# Patient Record
Sex: Female | Born: 1970 | Race: Black or African American | Hispanic: No | Smoking: Former smoker
Health system: Southern US, Community
[De-identification: ages and names within clinical notes are randomized; demographics above are authoritative.]

## PROBLEM LIST (undated history)

## (undated) ENCOUNTER — Emergency Department (HOSPITAL_COMMUNITY): Admission: EM | Discharge status: Absent without leave | Payer: Self-pay

## (undated) DIAGNOSIS — R519 Headache, unspecified: Secondary | ICD-10-CM

## (undated) DIAGNOSIS — F32A Depression, unspecified: Secondary | ICD-10-CM

## (undated) DIAGNOSIS — K219 Gastro-esophageal reflux disease without esophagitis: Secondary | ICD-10-CM

## (undated) DIAGNOSIS — D219 Benign neoplasm of connective and other soft tissue, unspecified: Secondary | ICD-10-CM

## (undated) DIAGNOSIS — C801 Malignant (primary) neoplasm, unspecified: Secondary | ICD-10-CM

## (undated) DIAGNOSIS — F329 Major depressive disorder, single episode, unspecified: Secondary | ICD-10-CM

## (undated) DIAGNOSIS — D649 Anemia, unspecified: Secondary | ICD-10-CM

## (undated) DIAGNOSIS — F419 Anxiety disorder, unspecified: Secondary | ICD-10-CM

## (undated) DIAGNOSIS — M1990 Unspecified osteoarthritis, unspecified site: Secondary | ICD-10-CM

## (undated) HISTORY — PX: IR EMBO TUMOR ORGAN ISCHEMIA INFARCT INC GUIDE ROADMAPPING: IMG5449

## (undated) HISTORY — PX: EYE SURGERY: SHX253

## (undated) HISTORY — DX: Headache, unspecified: R51.9

---

## 2011-08-09 ENCOUNTER — Other Ambulatory Visit: Payer: Self-pay | Admitting: Otolaryngology

## 2011-08-09 DIAGNOSIS — R1310 Dysphagia, unspecified: Secondary | ICD-10-CM

## 2011-08-15 ENCOUNTER — Ambulatory Visit
Admission: RE | Admit: 2011-08-15 | Discharge: 2011-08-15 | Disposition: A | Discharge status: Home / Self Care | Payer: Managed Care, Other (non HMO) | Source: Ambulatory Visit | Attending: Otolaryngology | Admitting: Otolaryngology

## 2011-08-15 DIAGNOSIS — R1310 Dysphagia, unspecified: Secondary | ICD-10-CM

## 2011-08-18 ENCOUNTER — Other Ambulatory Visit: Payer: Self-pay | Admitting: Otolaryngology

## 2011-08-19 ENCOUNTER — Ambulatory Visit
Admission: RE | Admit: 2011-08-19 | Discharge: 2011-08-19 | Disposition: A | Discharge status: Home / Self Care | Payer: Managed Care, Other (non HMO) | Source: Ambulatory Visit | Attending: Otolaryngology | Admitting: Otolaryngology

## 2011-08-23 ENCOUNTER — Other Ambulatory Visit: Payer: Managed Care, Other (non HMO)

## 2012-10-05 ENCOUNTER — Encounter: Payer: Managed Care, Other (non HMO) | Admitting: Obstetrics & Gynecology

## 2014-04-30 DIAGNOSIS — F32A Depression, unspecified: Secondary | ICD-10-CM | POA: Insufficient documentation

## 2014-09-09 ENCOUNTER — Emergency Department (HOSPITAL_BASED_OUTPATIENT_CLINIC_OR_DEPARTMENT_OTHER)
Admission: EM | Admit: 2014-09-09 | Discharge: 2014-09-09 | Disposition: A | Discharge status: Home / Self Care | Payer: Managed Care, Other (non HMO) | Attending: Emergency Medicine | Admitting: Emergency Medicine

## 2014-09-09 ENCOUNTER — Emergency Department (HOSPITAL_BASED_OUTPATIENT_CLINIC_OR_DEPARTMENT_OTHER): Admission: EM | Admit: 2014-09-09 | Discharge: 2014-09-09 | Discharge status: Absent without leave | Payer: Self-pay

## 2014-09-09 ENCOUNTER — Encounter (HOSPITAL_BASED_OUTPATIENT_CLINIC_OR_DEPARTMENT_OTHER): Payer: Self-pay

## 2014-09-09 DIAGNOSIS — R319 Hematuria, unspecified: Secondary | ICD-10-CM

## 2014-09-09 DIAGNOSIS — Z8719 Personal history of other diseases of the digestive system: Secondary | ICD-10-CM | POA: Insufficient documentation

## 2014-09-09 DIAGNOSIS — Z720 Tobacco use: Secondary | ICD-10-CM | POA: Insufficient documentation

## 2014-09-09 DIAGNOSIS — I10 Essential (primary) hypertension: Secondary | ICD-10-CM

## 2014-09-09 DIAGNOSIS — Z3202 Encounter for pregnancy test, result negative: Secondary | ICD-10-CM | POA: Insufficient documentation

## 2014-09-09 HISTORY — DX: Gastro-esophageal reflux disease without esophagitis: K21.9

## 2014-09-09 LAB — CBC WITH DIFFERENTIAL/PLATELET
BASOS ABS: 0 10*3/uL (ref 0.0–0.1)
BASOS PCT: 0 % (ref 0–1)
EOS ABS: 0.1 10*3/uL (ref 0.0–0.7)
Eosinophils Relative: 2 % (ref 0–5)
HEMATOCRIT: 33.1 % — AB (ref 36.0–46.0)
Hemoglobin: 10.2 g/dL — ABNORMAL LOW (ref 12.0–15.0)
Lymphocytes Relative: 32 % (ref 12–46)
Lymphs Abs: 1.7 10*3/uL (ref 0.7–4.0)
MCH: 26.9 pg (ref 26.0–34.0)
MCHC: 30.8 g/dL (ref 30.0–36.0)
MCV: 87.3 fL (ref 78.0–100.0)
Monocytes Absolute: 0.6 10*3/uL (ref 0.1–1.0)
Monocytes Relative: 11 % (ref 3–12)
Neutro Abs: 2.8 10*3/uL (ref 1.7–7.7)
Neutrophils Relative %: 55 % (ref 43–77)
Platelets: 279 10*3/uL (ref 150–400)
RBC: 3.79 MIL/uL — ABNORMAL LOW (ref 3.87–5.11)
RDW: 17.6 % — AB (ref 11.5–15.5)
WBC: 5.2 10*3/uL (ref 4.0–10.5)

## 2014-09-09 LAB — URINE MICROSCOPIC-ADD ON

## 2014-09-09 LAB — BASIC METABOLIC PANEL
Anion gap: 3 — ABNORMAL LOW (ref 5–15)
BUN: 13 mg/dL (ref 6–23)
CO2: 20 mmol/L (ref 19–32)
Calcium: 8.3 mg/dL — ABNORMAL LOW (ref 8.4–10.5)
Chloride: 111 mmol/L (ref 96–112)
Creatinine, Ser: 0.76 mg/dL (ref 0.50–1.10)
GFR calc Af Amer: >90 mL/min (ref >90)
GFR calc non Af Amer: >90 mL/min (ref >90)
Glucose, Bld: 101 mg/dL — ABNORMAL HIGH (ref 70–99)
Potassium: 4.6 mmol/L (ref 3.5–5.1)
Sodium: 134 mmol/L — ABNORMAL LOW (ref 135–145)

## 2014-09-09 LAB — PREGNANCY, URINE: PREG TEST UR: NEGATIVE

## 2014-09-09 LAB — URINALYSIS, ROUTINE W REFLEX MICROSCOPIC
BILIRUBIN URINE: NEGATIVE
Glucose, UA: NEGATIVE mg/dL
KETONES UR: 15 mg/dL — AB
LEUKOCYTES UA: NEGATIVE
NITRITE: NEGATIVE
PH: 6 (ref 5.0–8.0)
PROTEIN: NEGATIVE mg/dL
Specific Gravity, Urine: 1.024 (ref 1.005–1.030)
Urobilinogen, UA: 1 mg/dL (ref 0.0–1.0)

## 2014-09-09 MED ORDER — ACETAMINOPHEN 325 MG PO TABS
650.0000 mg | ORAL_TABLET | Freq: Once | ORAL | Status: AC
  Administered 2014-09-09: 650 mg via ORAL
  Filled 2014-09-09: qty 2

## 2014-09-09 MED ORDER — HYDROCHLOROTHIAZIDE 25 MG PO TABS: 25.0000 mg | ORAL_TABLET | Freq: Every day | ORAL | Status: DC

## 2014-09-09 NOTE — ED Provider Notes (Signed)
CSN: [id number]     Arrival date & time 09/09/14  1258 History   First MD Initiated Contact with Patient 09/09/14 1514     Chief Complaint  Patient presents with  . Flank Pain     (Consider location/radiation/quality/duration/timing/severity/associated sxs/prior Treatment) HPI  44 year old female presents with an intermittent headache since last week. Patient states it came on gradually and has come and gone. Nothing seems to make it better or worse. She was trying to enroll in a medical study yesterday and they took her blood pressures that was 170/100. She's never been told she has high blood pressure before. The headache is a throbbing headache on the left side of her head. There is no chest pain. No blurry vision or weakness. No shortness of breath. She also been having intermittent flank pain bilaterally for this past week. One time last week she has blood in her urine but ever since then after increasing her water intake she's not noticed any. Patient denies any dysuria. Has been taking Aleve with some relief of her headache. No primary care doctor.  Past Medical History  Diagnosis Date  . GERD (gastroesophageal reflux disease)    History reviewed. No pertinent past surgical history. No family history on file. History  Substance Use Topics  . Smoking status: Current Some Day Smoker  . Smokeless tobacco: Not on file  . Alcohol Use: No   OB History    No data available     Review of Systems  Constitutional: Negative for fever.  Eyes: Negative for visual disturbance.  Respiratory: Negative for shortness of breath.   Cardiovascular: Negative for chest pain.  Gastrointestinal: Negative for vomiting and abdominal pain.  Genitourinary: Positive for flank pain. Negative for dysuria.  Neurological: Positive for headaches. Negative for dizziness, weakness and numbness.  All other systems reviewed and are negative.     Allergies  Percocet and Sulfa antibiotics  Home Medications    Prior to Admission medications   Not on File   BP 168/98 mmHg  Pulse 74  Temp(Src) 98.5 F (36.9 C) (Oral)  Resp 16  Ht 5\' 6"  (1.676 m)  Wt 153 lb (69.4 kg)  BMI 24.71 kg/m2  SpO2 100%  LMP 09/09/2014 Physical Exam  Constitutional: She is oriented to person, place, and time. She appears well-developed and well-nourished.  HENT:  Head: Normocephalic and atraumatic.  Right Ear: External ear normal.  Left Ear: External ear normal.  Nose: Nose normal.  Eyes: EOM are normal. Pupils are equal, round, and reactive to light. Right eye exhibits no discharge. Left eye exhibits no discharge.  Cardiovascular: Normal rate, regular rhythm and normal heart sounds.   Pulmonary/Chest: Effort normal and breath sounds normal.  Abdominal: Soft. Normal appearance. There is no tenderness. There is CVA tenderness (mild, bilateral).  Neurological: She is alert and oriented to person, place, and time. GCS eye subscore is 4. GCS verbal subscore is 5. GCS motor subscore is 6.  Reflex Scores:      Bicep reflexes are 2+ on the right side and 2+ on the left side.      Patellar reflexes are 2+ on the right side and 2+ on the left side. CN 2-12 grossly intact. 5/5 strength in all 4 extremities. Normal gross sensation  Skin: Skin is warm and dry.  Nursing note and vitals reviewed.   ED Course  Procedures (including critical care time) Labs Review Labs Reviewed  URINALYSIS, ROUTINE W REFLEX MICROSCOPIC - Abnormal; Notable for the following:  Hgb urine dipstick LARGE (*)    Ketones, ur 15 (*)    All other components within normal limits  BASIC METABOLIC PANEL - Abnormal; Notable for the following:    Sodium 134 (*)    Glucose, Bld 101 (*)    Calcium 8.3 (*)    Anion gap 3 (*)    All other components within normal limits  CBC WITH DIFFERENTIAL/PLATELET - Abnormal; Notable for the following:    RBC 3.79 (*)    Hemoglobin 10.2 (*)    HCT 33.1 (*)    RDW 17.6 (*)    All other components within  normal limits  URINE MICROSCOPIC-ADD ON - Abnormal; Notable for the following:    Squamous Epithelial / LPF FEW (*)    Bacteria, UA FEW (*)    All other components within normal limits  PREGNANCY, URINE    Imaging Review No results found.   EKG Interpretation None      MDM   Final diagnoses:  Essential hypertension  Hematuria    Patient's headache is likely from her hypertension. No focal neurologic abnormalities and a normal exam. No sudden onset to suggest subarachnoid hemorrhage. I do not feel she needs acute imaging currently. We'll treat her hypertension and give her referral for a PCP. Patient advised return precautions. No chest symptoms. Uncertain the etiology of her bilateral flank pain. There is no UTI. She does have hematuria but states she is also on her menstrual cycle. She is not having significant pain and I highly doubt bilateral renal stones. At this point will not image and recommend the follow-up with the primary provider.    Sherwood Gambler, MD 09/09/14 [phone]

## 2014-09-09 NOTE — ED Notes (Signed)
Reports headache since last week. Also sts left flank pain and hematuria yesterday

## 2015-04-10 ENCOUNTER — Emergency Department (HOSPITAL_COMMUNITY)
Admission: EM | Admit: 2015-04-10 | Discharge: 2015-04-10 | Disposition: A | Discharge status: Home / Self Care | Payer: 59 | Attending: Emergency Medicine | Admitting: Emergency Medicine

## 2015-04-10 ENCOUNTER — Encounter (HOSPITAL_COMMUNITY): Payer: Self-pay

## 2015-04-10 ENCOUNTER — Emergency Department (HOSPITAL_COMMUNITY): Payer: 59

## 2015-04-10 DIAGNOSIS — IMO0001 Reserved for inherently not codable concepts without codable children: Secondary | ICD-10-CM

## 2015-04-10 DIAGNOSIS — Y9289 Other specified places as the place of occurrence of the external cause: Secondary | ICD-10-CM | POA: Diagnosis not present

## 2015-04-10 DIAGNOSIS — R030 Elevated blood-pressure reading, without diagnosis of hypertension: Secondary | ICD-10-CM | POA: Diagnosis not present

## 2015-04-10 DIAGNOSIS — S99922A Unspecified injury of left foot, initial encounter: Secondary | ICD-10-CM | POA: Diagnosis present

## 2015-04-10 DIAGNOSIS — Z79899 Other long term (current) drug therapy: Secondary | ICD-10-CM | POA: Insufficient documentation

## 2015-04-10 DIAGNOSIS — Z720 Tobacco use: Secondary | ICD-10-CM | POA: Diagnosis not present

## 2015-04-10 DIAGNOSIS — S92332A Displaced fracture of third metatarsal bone, left foot, initial encounter for closed fracture: Secondary | ICD-10-CM | POA: Diagnosis not present

## 2015-04-10 DIAGNOSIS — Y998 Other external cause status: Secondary | ICD-10-CM | POA: Insufficient documentation

## 2015-04-10 DIAGNOSIS — S92342A Displaced fracture of fourth metatarsal bone, left foot, initial encounter for closed fracture: Secondary | ICD-10-CM | POA: Insufficient documentation

## 2015-04-10 DIAGNOSIS — S92302A Fracture of unspecified metatarsal bone(s), left foot, initial encounter for closed fracture: Secondary | ICD-10-CM

## 2015-04-10 DIAGNOSIS — Y9389 Activity, other specified: Secondary | ICD-10-CM | POA: Insufficient documentation
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
## 2015-04-10 NOTE — Discharge Instructions (Signed)
Elevate the left foot above your heart, as much as possible. Use ibuprofen for pain. Follow-up with the primary care doctor for blood pressure check in one to 2 weeks    Cast or Splint Care Casts and splints support injured limbs and keep bones from moving while they heal. It is important to care for your cast or splint at home.  HOME CARE INSTRUCTIONS  Keep the cast or splint uncovered during the drying period. It can take 24 to 48 hours to dry if it is made of plaster. A fiberglass cast will dry in less than 1 hour.  Do not rest the cast on anything harder than a pillow for the first 24 hours.  Do not put weight on your injured limb or apply pressure to the cast until your health care provider gives you permission.  Keep the cast or splint dry. Wet casts or splints can lose their shape and may not support the limb as well. A wet cast that has lost its shape can also create harmful pressure on your skin when it dries. Also, wet skin can become infected.  Cover the cast or splint with a plastic bag when bathing or when out in the rain or snow. If the cast is on the trunk of the body, take sponge baths until the cast is removed.  If your cast does become wet, dry it with a towel or a blow dryer on the cool setting only.  Keep your cast or splint clean. Soiled casts may be wiped with a moistened cloth.  Do not place any hard or soft foreign objects under your cast or splint, such as cotton, toilet paper, lotion, or powder.  Do not try to scratch the skin under the cast with any object. The object could get stuck inside the cast. Also, scratching could lead to an infection. If itching is a problem, use a blow dryer on a cool setting to relieve discomfort.  Do not trim or cut your cast or remove padding from inside of it.  Exercise all joints next to the injury that are not immobilized by the cast or splint. For example, if you have a long leg cast, exercise the hip joint and toes. If you  have an arm cast or splint, exercise the shoulder, elbow, thumb, and fingers.  Elevate your injured arm or leg on 1 or 2 pillows for the first 1 to 3 days to decrease swelling and pain.It is best if you can comfortably elevate your cast so it is higher than your heart. SEEK MEDICAL CARE IF:   Your cast or splint cracks.  Your cast or splint is too tight or too loose.  You have unbearable itching inside the cast.  Your cast becomes wet or develops a soft spot or area.  You have a bad smell coming from inside your cast.  You get an object stuck under your cast.  Your skin around the cast becomes red or raw.  You have new pain or worsening pain after the cast has been applied. SEEK IMMEDIATE MEDICAL CARE IF:   You have fluid leaking through the cast.  You are unable to move your fingers or toes.  You have discolored (blue or white), cool, painful, or very swollen fingers or toes beyond the cast.  You have tingling or numbness around the injured area.  You have severe pain or pressure under the cast.  You have any difficulty with your breathing or have shortness of breath.  You  have chest pain.   This information is not intended to replace advice given to you by your health care provider. Make sure you discuss any questions you have with your health care provider.   Document Released: 06/17/2000 Document Revised: 04/10/2013 Document Reviewed: 12/27/2012 Elsevier Interactive Patient Education 2016 [employer].  Hypertension Hypertension is another name for high blood pressure. High blood pressure forces your heart to work harder to pump blood. A blood pressure reading has two numbers, which includes a higher number over a lower number (example: 110/72). HOME CARE   Have your blood pressure rechecked by your doctor.  Only take medicine as told by your doctor. Follow the directions carefully. The medicine does not work as well if you skip doses. Skipping doses also puts you  at risk for problems.  Do not smoke.  Monitor your blood pressure at home as told by your doctor. GET HELP IF:  You think you are having a reaction to the medicine you are taking.  You have repeat headaches or feel dizzy.  You have puffiness (swelling) in your ankles.  You have trouble with your vision. GET HELP RIGHT AWAY IF:   You get a very bad headache and are confused.  You feel weak, numb, or faint.  You get chest or belly (abdominal) pain.  You throw up (vomit).  You cannot breathe very well. MAKE SURE YOU:   Understand these instructions.  Will watch your condition.  Will get help right away if you are not doing well or get worse.   This information is not intended to replace advice given to you by your health care provider. Make sure you discuss any questions you have with your health care provider.   Document Released: 12/07/2007 Document Revised: 06/25/2013 Document Reviewed: 04/12/2013 Elsevier Interactive Patient Education 2016 [employer].   Emergency Department Resource Guide 1) Find a Doctor and Pay Out of Pocket Although you won't have to find out who is covered by your insurance plan, it is a good idea to ask around and get recommendations. You will then need to call the office and see if the doctor you have chosen will accept you as a new patient and what types of options they offer for patients who are self-pay. Some doctors offer discounts or will set up payment plans for their patients who do not have insurance, but you will need to ask so you aren't surprised when you get to your appointment.  2) Contact Your Local Health Department Not all health departments have doctors that can see patients for sick visits, but many do, so it is worth a call to see if yours does. If you don't know where your local health department is, you can check in your phone book. The CDC also has a tool to help you locate your state's health department, and many state  websites also have listings of all of their local health departments.  3) Find a [facility] If your illness is not likely to be very severe or complicated, you may want to try a walk in clinic. These are popping up all over the country in pharmacies, drugstores, and shopping centers. They're usually staffed by nurse practitioners or physician assistants that have been trained to treat common illnesses and complaints. They're usually fairly quick and inexpensive. However, if you have serious medical issues or chronic medical problems, these are probably not your best option.  No Primary Care Doctor: - Call Health Connect at  [phone] - they  can help you locate a primary care doctor that  accepts your insurance, provides certain services, etc. - Physician Referral Service- [phone]  Chronic Pain Problems: Organization         Address  Phone   Notes  [facility]  [phone] Patients need to be referred by their primary care doctor.   Medication Assistance: Organization         Address  Phone   Notes  [facility] Medication [facility] [address] [place], [place] [address] [phone] --Must be a resident of [facility] -- Must have NO insurance coverage whatsoever (no Medicaid/ Medicare, etc.) -- The pt. MUST have a primary care doctor that directs their care regularly and follows them in the community   MedAssist  [phone]   [employer]  [phone]    Agencies that provide inexpensive medical care: Organization         Address  Phone   Notes  [place]  [phone]   [facility]    [phone]   [facility] [place], [place] [address] [phone]   [place] [address], [place] [phone]   Planned Parenthood    [phone]   [facility]    [phone]   [place] and [address], [place] Phone:  [phone], Fax:  [phone] Hours of Operation:  9 am - 6 pm, M-F.  Also accepts Medicaid/Medicare and self-pay.  [facility] for [place] [address] [place], [address], [place] Phone: [phone], Fax: [phone]. Hours of Operation:  8:30 am - 5:30 pm, M-F.  Also accepts Medicaid and self-pay.  [facility] [address], [place] Phone: [phone]   [place], [place], [place] [phone], Ext. [phone] Mondays & Thursdays: 7-9 AM.  First 15 patients are seen on a first come, first serve basis.    [place] Providers:  Organization         Address  Phone   Notes  [facility] [address], [place] [phone] Also accepts self-pay patients.  [facility] [address], [place]  [phone]   [place], [address], [place] [phone]   [facility] [address], [place] [phone]   Lucianne Lei [address], [place]   [phone] Only accepts [place] Access [place] patients after they have their name applied to their card.   Self-Pay (no insurance) in [facility] [place]:  Organization         Address  Phone   Notes  Sickle Cell Patients, [facility] Internal [facility] [place] [phone]   [facility] [place] [phone]   [facility] [place]  [place], [place], [place] [phone]   [facility]/Dr. Osei-Bonsu  [address], [place] or [address], [place] [phone] Phone number for both [place] and [place] locations is the same.  [facility] [address], [place] [phone]   [facility] [place] or [place]  [address] [phone]   [facility] [place] [phone], phone; [phone], fax Sees patients 1st and 3rd Saturday of every month.  Must not qualify for public or private insurance (i.e. Medicaid, Medicare, [place] Health Choice, Veterans' Benefits)  Household income should be no more than 200% of the poverty level The clinic cannot treat you if you are pregnant or think you are pregnant  Sexually transmitted diseases are not treated at the clinic.    Dental Care: Organization         Address  Phone  Notes  [facility] Department of [facility] [place] [phone] Accepts children up to age 22 who are enrolled in [place] or [place]; pregnant women with a Medicaid card; and children who have applied for Medicaid or [place] Health Choice, but were declined, whose parents can pay a reduced fee at time of service.  [facility] Department of [facility]  [address], [place] [phone] Accepts children up to age 31 who are enrolled in [place] or [place]; pregnant women with a Medicaid card; and children who have applied for Medicaid or [place] Health Choice, but were declined, whose parents can pay a reduced fee at time of service.  [place] Adult Dental Access PROGRAM  [place] [phone] Patients are seen by appointment only. Walk-ins are not accepted. [place] will see patients 30 years of age and older. Monday - Tuesday (8am-5pm) Most Wednesdays (8:30-5pm) $30 per visit, cash only  [facility] Adult Dental Access PROGRAM  [address], [facility] [phone] Patients are seen by appointment only. Walk-ins are not accepted. [place] will see patients 29 years of age and older. One Wednesday Evening (Monthly: Volunteer Based).  $30 per visit, cash only  [place]  [phone] for adults; Children under age 9, call Graduate Pediatric Dentistry at [phone]. Children aged 61-14, please call [phone] to request a pediatric application.  Dental services are provided in all areas of dental care including fillings, crowns and bridges, complete and partial dentures, implants, gum treatment, root canals, and extractions. Preventive care is also provided. Treatment is provided to both adults and children. Patients are selected via a lottery and there is often a waiting list.   [facility] [address] [place]  [phone] [email or web address]   [facility] [address] [place], [place] [phone], Ext. [phone] Second and Fourth Thursday of each month, opens at 6:30 AM; Clinic ends at 9 AM.  Patients are seen on a first-come first-served basis, and a limited number are seen during each clinic.   [facility]  [address] [place] Danker [place], [place] [phone]   Eligibility Requirements You must have lived in [place], [place], or [place] counties for at least the last three months.   You cannot be eligible for state or federal sponsored [employer], including [employer], [place], or [employer].   You generally cannot be eligible for healthcare insurance through your employer.    How to apply: Eligibility screenings are held every Tuesday and Wednesday afternoon from 1:00 pm until 4:00 pm. You do not need an appointment for the interview!  [facility] [address] [place], [place]   [place]  [place] Department  [place] in the Community: Intensive Outpatient Programs Organization         Address  Phone  Notes  [place]. [address] [place], [place]  [phone]   [facility] Outpatient [address] [place], [place]   ADS: Alcohol [facility] [address] [place], [place]   [place] [address],  [place], [place] or [phone]   Substance Abuse Resources Organization         Address  Phone  Notes  Al[facility] Drug Services  [phone]   [place]  [phone]   [facility] [place]   Chinita Pester  [phone]   Residential & Outpatient Substance Abuse Program  [phone]   Psychological Services Organization         Address  Phone  Notes  [facility] [place]  [place]  [phone]   [place] [address], [place] or [phone]    Mobile Crisis Teams Organization         Address  Phone  Notes  [facility], Mobile Crisis Care Unit  [phone]   [facility]  [address] [place], [place]   [place] [address] [place] [phone]    Self-Help/Support Groups Organization         Address  Phone             Notes  [place]. of [place] - variety of support groups  [place] Call for more information  Narcotics Anonymous (NA), [facility] [address], [employer] [place]  2 meetings at this location   Special educational needs teacher         Address  Phone  Notes  [facility] Residential Treatment [place],    [place]  [phone]   [facility]  [address] [place] [address]93, [place], [place]   [place], [place] [phone] Admissions: 8am-3pm M-F  Incentives Substance [place] [address]    [place], [place] [phone]   [facility] [address] [place], [place], [place]   [facility] [address],    [place], [place]   [facility] Programs - Intensive Outpatient [address] Kristeen [address], [place], [place]   [facility] ([place]  [place], [place] [phone] or [phone]   [facility] (RTS) [address], [place], [place] Accepts Medicaid  [facility] [place] [address],  [place] [phone] Substance Abuse/Addiction Treatment   [facility] Organization         Address  Phone  Notes  [facility]  [phone]   Domenic Schwab, PhD [address] [place]lis [place], [place]   [phone] or [phone]   [facility], [place] [phone]   [place] [address] [place], [place] [phone] Insurance/Medicaid/sponsorship through [employer] and Families [address], [address]  [place], [place] [phone] [place], [place] [phone]    Dr. Adele Schilder  [phone]   [facility] Dept. 1) [address] [place] 2) [place] 3)  [address], [place] [phone]  [phone]   [place] [phone] or [phone] (After Hours)

## 2016-01-08 ENCOUNTER — Encounter (HOSPITAL_BASED_OUTPATIENT_CLINIC_OR_DEPARTMENT_OTHER): Payer: Self-pay

## 2017-03-31 ENCOUNTER — Emergency Department
Admission: EM | Admit: 2017-03-31 | Discharge: 2017-03-31 | Disposition: A | Discharge status: Home / Self Care | Payer: Self-pay | Attending: Emergency Medicine | Admitting: Emergency Medicine

## 2017-03-31 ENCOUNTER — Encounter: Payer: Self-pay | Admitting: Emergency Medicine

## 2017-03-31 ENCOUNTER — Emergency Department: Payer: Self-pay

## 2017-03-31 DIAGNOSIS — R300 Dysuria: Secondary | ICD-10-CM | POA: Insufficient documentation

## 2017-03-31 DIAGNOSIS — F17200 Nicotine dependence, unspecified, uncomplicated: Secondary | ICD-10-CM | POA: Insufficient documentation

## 2017-03-31 DIAGNOSIS — Z79899 Other long term (current) drug therapy: Secondary | ICD-10-CM | POA: Insufficient documentation

## 2017-03-31 DIAGNOSIS — R1031 Right lower quadrant pain: Secondary | ICD-10-CM | POA: Insufficient documentation

## 2017-03-31 DIAGNOSIS — R350 Frequency of micturition: Secondary | ICD-10-CM | POA: Insufficient documentation

## 2017-03-31 DIAGNOSIS — N3090 Cystitis, unspecified without hematuria: Secondary | ICD-10-CM

## 2017-03-31 LAB — URINALYSIS, COMPLETE (UACMP) WITH MICROSCOPIC
Bilirubin Urine: NEGATIVE
Glucose, UA: NEGATIVE mg/dL
Ketones, ur: NEGATIVE mg/dL
NITRITE: NEGATIVE
PH: 6 (ref 5.0–8.0)
PROTEIN: 30 mg/dL — AB
Specific Gravity, Urine: 1.021 (ref 1.005–1.030)

## 2017-03-31 LAB — BASIC METABOLIC PANEL
ANION GAP: 9 (ref 5–15)
BUN: 9 mg/dL (ref 6–20)
CO2: 25 mmol/L (ref 22–32)
Calcium: 9.1 mg/dL (ref 8.9–10.3)
Chloride: 108 mmol/L (ref 101–111)
Creatinine, Ser: 0.76 mg/dL (ref 0.44–1.00)
GFR calc Af Amer: >60 mL/min (ref >60)
Glucose, Bld: 99 mg/dL (ref 65–99)
Potassium: 4.3 mmol/L (ref 3.5–5.1)
Sodium: 142 mmol/L (ref 135–145)

## 2017-03-31 LAB — CBC
HCT: 29.4 % — ABNORMAL LOW (ref 35.0–47.0)
HEMOGLOBIN: 9.3 g/dL — AB (ref 12.0–16.0)
MCH: 25.9 pg — AB (ref 26.0–34.0)
MCHC: 31.8 g/dL — ABNORMAL LOW (ref 32.0–36.0)
MCV: 81.4 fL (ref 80.0–100.0)
Platelets: 337 10*3/uL (ref 150–440)
RBC: 3.61 MIL/uL — ABNORMAL LOW (ref 3.80–5.20)
RDW: 22.1 % — AB (ref 11.5–14.5)
WBC: 4.4 10*3/uL (ref 3.6–11.0)

## 2017-03-31 LAB — POCT PREGNANCY, URINE: Preg Test, Ur: NEGATIVE

## 2017-03-31 MED ORDER — CEPHALEXIN 500 MG PO CAPS: 500.0000 mg | ORAL_CAPSULE | Freq: Two times a day (BID) | ORAL | 0 refills | Status: DC

## 2017-03-31 NOTE — ED Provider Notes (Signed)
[facility] Emergency Department Provider Note  ____________________________________________  Time seen: Approximately 6:44 PM  I have reviewed the triage vital signs and the nursing notes.   HISTORY  Chief Complaint Flank Pain    HPI Cindy Brewer is a 46 y.o. female who complains of right flank and right lower quadrant abdominal pain for the past 3-4 weeks, much worse in the last 2 days. Also reports urinary frequency and dysuria. Has a feeling of inability to empty her bladder. No fever or chills or sweats. Does have a history of UTIs. Pain is intermittent and waxing and waning. No aggravating or alleviating factors.     Past Medical History:  Diagnosis Date  . GERD (gastroesophageal reflux disease)      There are no active problems to display for this patient.    Past Surgical History:  Procedure Laterality Date  . CESAREAN SECTION       Prior to Admission medications   Medication Sig Start Date End Date Taking? Authorizing Provider  cephALEXin (KEFLEX) 500 MG capsule Take 1 capsule (500 mg total) by mouth 2 (two) times daily. 03/31/17   Carrie Mew, MD  hydrochlorothiazide (HYDRODIURIL) 25 MG tablet Take 1 tablet (25 mg total) by mouth daily. 09/09/14   Sherwood Gambler, MD  Multiple Vitamin (MULTIVITAMIN WITH MINERALS) TABS tablet Take 1 tablet by mouth daily.    [provider]  Thiamine HCl (VITAMIN B-1) 250 MG tablet Take 250 mg by mouth daily.    [provider]  vitamin A 10000 UNIT capsule Take 10,000 Units by mouth daily.    [provider]  vitamin C (ASCORBIC ACID) 500 MG tablet Take 500 mg by mouth daily.    [provider]     Allergies Demerol [meperidine]; Percocet [oxycodone-acetaminophen]; Sulfa antibiotics; Percocet [oxycodone-acetaminophen]; and Sulfa antibiotics   No family history on file.  Social History Social History  Substance Use Topics  . Smoking status:  Current Every Day Smoker  . Smokeless tobacco: Never Used  . Alcohol use Yes    Review of Systems  Constitutional:   No fever or chills.  ENT:   No sore throat. No rhinorrhea. Cardiovascular:   No chest pain or syncope. Respiratory:   No dyspnea or cough. Gastrointestinal:   positive abdominal pain as above without vomiting and diarrhea.  Musculoskeletal:   Negative for focal pain or swelling All other systems reviewed and are negative except as documented above in ROS and HPI.  ____________________________________________   PHYSICAL EXAM:  VITAL SIGNS: ED Triage Vitals  Enc Vitals Group     BP 03/31/17 1418 (!) 156/97     Pulse Rate 03/31/17 1418 66     Resp 03/31/17 1418 18     Temp 03/31/17 1418 98.7 F (37.1 C)     Temp Source 03/31/17 1418 Oral     SpO2 03/31/17 1418 99 %     Weight 03/31/17 1418 143 lb (64.9 kg)     Height 03/31/17 1418 5\' 6"  (1.676 m)     Head Circumference --      Peak Flow --      Pain Score 03/31/17 1417 5     Pain Loc --      Pain Edu? --      Excl. in [place]? --     Vital signs reviewed, nursing assessments reviewed.   Constitutional:   Alert and oriented. Well appearing and in no distress. Eyes:   No scleral icterus.  EOMI.  No nystagmus. No conjunctival pallor. PERRL. ENT   Head:   Normocephalic and atraumatic.   Nose:   No congestion/rhinnorhea.    Mouth/Throat:   MMM, no pharyngeal erythema. No peritonsillar mass.    Neck:   No meningismus. Full ROM Hematological/Lymphatic/Immunilogical:   No cervical lymphadenopathy. Cardiovascular:   RRR. Symmetric bilateral radial and DP pulses.  No murmurs.  Respiratory:   Normal respiratory effort without tachypnea/retractions. Breath sounds are clear and equal bilaterally. No wheezes/rales/rhonchi. Gastrointestinal:   Soft with suprapubic tenderness. Non distended. There is no CVA tenderness.  No rebound, rigidity, or guarding. Genitourinary:   deferred Musculoskeletal:   Normal  range of motion in all extremities. No joint effusions.  No lower extremity tenderness.  No edema. Neurologic:   Normal speech and language.  Motor grossly intact. No gross focal neurologic deficits are appreciated.  Skin:    Skin is warm, dry and intact. No rash noted.  No petechiae, purpura, or bullae.  ____________________________________________    LABS (pertinent positives/negatives) (all labs ordered are listed, but only abnormal results are displayed) Labs Reviewed  URINALYSIS, COMPLETE (UACMP) WITH MICROSCOPIC - Abnormal; Notable for the following:       Result Value   Color, Urine YELLOW (*)    APPearance HAZY (*)    Hgb urine dipstick SMALL (*)    Protein, ur 30 (*)    Leukocytes, UA MODERATE (*)    Bacteria, UA RARE (*)    Squamous Epithelial / LPF 0-5 (*)    All other components within normal limits  CBC - Abnormal; Notable for the following:    RBC 3.61 (*)    Hemoglobin 9.3 (*)    HCT 29.4 (*)    MCH 25.9 (*)    MCHC 31.8 (*)    RDW 22.1 (*)    All other components within normal limits  BASIC METABOLIC PANEL  POC URINE PREG, ED  POCT PREGNANCY, URINE   ____________________________________________   EKG    ____________________________________________    RADIOLOGY  Ct Renal Stone Study  Result Date: 03/31/2017 CLINICAL DATA:  Flank pain, subjective urinary retention for 2 weeks. EXAM: CT ABDOMEN AND PELVIS WITHOUT CONTRAST TECHNIQUE: Multidetector CT imaging of the abdomen and pelvis was performed following the standard protocol without IV contrast. COMPARISON:  None. FINDINGS: LOWER CHEST: Lung bases are clear. The visualized heart size is normal. No pericardial effusion. HEPATOBILIARY: Normal. PANCREAS: Normal. SPLEEN: Normal. ADRENALS/URINARY TRACT: Kidneys are orthotopic, demonstrating normal size and morphology. No nephrolithiasis, hydronephrosis; limited assessment for renal masses on this nonenhanced examination. The unopacified ureters are normal  in course and caliber. Urinary bladder is partially distended and unremarkable. Normal adrenal glands. STOMACH/BOWEL: The stomach, small and large bowel are normal in course and caliber without inflammatory changes, sensitivity decreased by lack of enteric contrast. Normal appendix. VASCULAR/LYMPHATIC: Aortoiliac vessels are normal in course and caliber. No lymphadenopathy by CT size criteria. REPRODUCTIVE: Enlarged lobulated uterus with suspected 5.8 cm LEFT intramural leiomyoma. OTHER: Small amount of free fluid in the pelvis is likely physiologic. No intraperitoneal free air or focal fluid collection. MUSCULOSKELETAL: Non-acute.  Anterior pelvic wall scarring. IMPRESSION: 1. No urolithiasis, obstructive uropathy nor acute intra-abdominal/pelvic process. 2. Enlarged, leiomyomatosis uterus. Electronically Signed   By: Elon Alas M.D.   On: 03/31/2017 17:45    ____________________________________________   PROCEDURES Procedures  ____________________________________________   INITIAL IMPRESSION / ASSESSMENT AND PLAN / ED COURSE  Pertinent labs & imaging results that were available during my care of the patient  were reviewed by me and considered in my medical decision making (see chart for details).  patient well appearing no acute distress, presents with abdominal pain that fits the pattern possibly for renal colic. She has no history of kidney stones, but with urinalysis showing a clear urinary tract infection, a CT scan was performed to be sure that she does not have an obstructing stone. This was negative. She is well-appearing with unremarkable vitals and unremarkable labs, and suitable for outpatient follow-up. Started on Keflex, urine culture. Considering the patient's symptoms, medical history, and physical examination today, I have low suspicion for cholecystitis or biliary pathology, pancreatitis, perforation or bowel obstruction, hernia, intra-abdominal abscess, AAA or dissection,  volvulus or intussusception, mesenteric ischemia, or appendicitis.        ____________________________________________   FINAL CLINICAL IMPRESSION(S) / ED DIAGNOSES  Final diagnoses:  Cystitis      New Prescriptions   CEPHALEXIN (KEFLEX) 500 MG CAPSULE    Take 1 capsule (500 mg total) by mouth 2 (two) times daily.     Portions of this note were generated with dragon dictation software. Dictation errors may occur despite best attempts at proofreading.    Carrie Mew, MD 03/31/17 [phone]

## 2017-03-31 NOTE — ED Notes (Signed)
Pt verbalizes understanding of discharge instructions.

## 2017-03-31 NOTE — ED Triage Notes (Signed)
Pt states she feels she is unable to empty out her bladder. Pain in flank area bilaterally and radiates to lower abd. Denies burning with urination, states this has been going on for 2 weeks now.

## 2017-03-31 NOTE — ED Notes (Signed)
Pt presents with back/lower abdominal/flank pain x 3-4 weeks, with symptoms worsening in last 2 days. States she has had urinary retention as well. Nausea affirmed; denies vomiting, fever, chills. Pt alert & oriented with NAD.

## 2017-04-02 LAB — URINE CULTURE

## 2017-06-28 ENCOUNTER — Encounter: Payer: Self-pay | Admitting: Physician Assistant

## 2017-06-28 ENCOUNTER — Emergency Department
Admission: EM | Admit: 2017-06-28 | Discharge: 2017-06-28 | Disposition: A | Discharge status: Home / Self Care | Payer: No Typology Code available for payment source | Attending: Emergency Medicine | Admitting: Emergency Medicine

## 2017-06-28 ENCOUNTER — Other Ambulatory Visit: Payer: Self-pay

## 2017-06-28 DIAGNOSIS — Y92410 Unspecified street and highway as the place of occurrence of the external cause: Secondary | ICD-10-CM | POA: Insufficient documentation

## 2017-06-28 DIAGNOSIS — Y9389 Activity, other specified: Secondary | ICD-10-CM | POA: Insufficient documentation

## 2017-06-28 DIAGNOSIS — F17200 Nicotine dependence, unspecified, uncomplicated: Secondary | ICD-10-CM | POA: Diagnosis not present

## 2017-06-28 DIAGNOSIS — Y999 Unspecified external cause status: Secondary | ICD-10-CM | POA: Insufficient documentation

## 2017-06-28 DIAGNOSIS — Z79899 Other long term (current) drug therapy: Secondary | ICD-10-CM | POA: Insufficient documentation

## 2017-06-28 DIAGNOSIS — S161XXA Strain of muscle, fascia and tendon at neck level, initial encounter: Secondary | ICD-10-CM | POA: Insufficient documentation

## 2017-06-28 DIAGNOSIS — S199XXA Unspecified injury of neck, initial encounter: Secondary | ICD-10-CM | POA: Diagnosis present

## 2017-06-28 MED ORDER — CYCLOBENZAPRINE HCL 10 MG PO TABS
10.0000 mg | ORAL_TABLET | Freq: Once | ORAL | Status: AC
  Administered 2017-06-28: 10 mg via ORAL
  Filled 2017-06-28: qty 1

## 2017-06-28 MED ORDER — CYCLOBENZAPRINE HCL 5 MG PO TABS: 5.0000 mg | ORAL_TABLET | Freq: Three times a day (TID) | ORAL | 0 refills | Status: DC | PRN
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
## 2017-06-28 NOTE — Discharge Instructions (Signed)
Your exam is essentially normal following your exam. Take the muscle relaxant as needed. Apply ice or moist heat as needed.
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
## 2017-07-04 DIAGNOSIS — C801 Malignant (primary) neoplasm, unspecified: Secondary | ICD-10-CM

## 2017-07-04 HISTORY — PX: BREAST LUMPECTOMY: SHX2

## 2017-07-04 HISTORY — DX: Malignant (primary) neoplasm, unspecified: C80.1

## 2017-07-04 HISTORY — PX: BREAST BIOPSY: SHX20

## 2017-07-25 ENCOUNTER — Other Ambulatory Visit (HOSPITAL_COMMUNITY): Payer: Self-pay | Admitting: *Deleted

## 2017-07-25 DIAGNOSIS — N6320 Unspecified lump in the left breast, unspecified quadrant: Secondary | ICD-10-CM

## 2017-08-03 ENCOUNTER — Other Ambulatory Visit (HOSPITAL_COMMUNITY): Payer: Self-pay | Admitting: Obstetrics and Gynecology

## 2017-08-03 ENCOUNTER — Ambulatory Visit
Admission: RE | Admit: 2017-08-03 | Discharge: 2017-08-03 | Disposition: A | Discharge status: Home / Self Care | Payer: No Typology Code available for payment source | Source: Ambulatory Visit | Attending: Obstetrics and Gynecology | Admitting: Obstetrics and Gynecology

## 2017-08-03 ENCOUNTER — Encounter (HOSPITAL_COMMUNITY): Payer: Self-pay

## 2017-08-03 ENCOUNTER — Ambulatory Visit (HOSPITAL_COMMUNITY)
Admission: RE | Admit: 2017-08-03 | Discharge: 2017-08-03 | Disposition: A | Discharge status: Home / Self Care | Payer: No Typology Code available for payment source | Source: Ambulatory Visit | Attending: Obstetrics and Gynecology | Admitting: Obstetrics and Gynecology

## 2017-08-03 ENCOUNTER — Ambulatory Visit
Admission: RE | Admit: 2017-08-03 | Discharge: 2017-08-03 | Disposition: A | Discharge status: Home / Self Care | Payer: Self-pay | Source: Ambulatory Visit | Attending: Obstetrics and Gynecology | Admitting: Obstetrics and Gynecology

## 2017-08-03 VITALS — BP 140/92 | Ht 66.0 in | Wt 154.6 lb

## 2017-08-03 DIAGNOSIS — N6320 Unspecified lump in the left breast, unspecified quadrant: Secondary | ICD-10-CM

## 2017-08-03 DIAGNOSIS — N6321 Unspecified lump in the left breast, upper outer quadrant: Secondary | ICD-10-CM

## 2017-08-03 DIAGNOSIS — Z1239 Encounter for other screening for malignant neoplasm of breast: Secondary | ICD-10-CM

## 2017-08-03 DIAGNOSIS — N6322 Unspecified lump in the left breast, upper inner quadrant: Secondary | ICD-10-CM

## 2017-08-03 DIAGNOSIS — N6325 Unspecified lump in the left breast, overlapping quadrants: Secondary | ICD-10-CM

## 2017-08-03 HISTORY — DX: Benign neoplasm of connective and other soft tissue, unspecified: D21.9

## 2017-08-03 NOTE — Patient Instructions (Addendum)
Explained breast self awareness with Cindy Brewer. Patient did not need a Pap smear today due to last Pap smear was in May 2018 per patient. Let her know [employer] will cover Pap smears every 3 years unless has a history of abnormal Pap smears. Referred patient to the [facility] for diagnostic mammogram and left breast ultrasound. Appointment scheduled for Thursday, August 03, 2017 at 1510.Discussed smoking cessation with patient. Referred patient to the [facility] Quitline and gave resources to free smoking cessation classes at [facility]. [name] verbalized understanding.  Breydan Shillingburg, [name], RN 4:36 PM

## 2017-08-03 NOTE — Progress Notes (Signed)
Complaints of three left breast lumps. Patient stated she noticed the first one in July and the other two in October.  Pap Smear: Pap smear not completed today. Last Pap smear was in May 2018 in [facility] and normal per patient. Per patient has a history of one abnormal Pap smear 20 years ago that a colposcopy and LEEP was completed for follow-up. No Pap smear results are in Epic.  Physical exam: Breasts Breasts symmetrical. No skin abnormalities bilateral breasts. No nipple retraction bilateral breasts. No nipple discharge bilateral breasts. No lymphadenopathy. No lumps palpated right breast. Palpated three lumps within the left breast. Palpated a lump within the left breast at 1 o'clock 3 cm from the nipple, 6 o'clock 4 cm from the nipple, and 11 o'clock 3 cm from the nipple. No complaints of pain or tenderness on exam. Referred patient to the [facility] for diagnostic mammogram and left breast ultrasound. Appointment scheduled for Thursday, August 03, 2017 at 1510.        Pelvic/Bimanual No Pap smear completed today since last Pap smear was in May 2018 per patient. Pap smear not indicated per [employer]CCCP guidelines.   Smoking History: Patient is a current smoker. Discussed smoking cessation with patient. Referred patient to the [facility] Quitline and gave resources to free smoking cessation classes at [facility].  Patient Navigation: Patient education provided. Access to services provided for patient through [facility] program.

## 2017-08-09 ENCOUNTER — Other Ambulatory Visit: Payer: Self-pay

## 2017-08-09 ENCOUNTER — Inpatient Hospital Stay: Admission: RE | Admit: 2017-08-09 | Payer: Self-pay | Source: Ambulatory Visit

## 2017-08-10 ENCOUNTER — Other Ambulatory Visit (HOSPITAL_COMMUNITY): Payer: Self-pay | Admitting: Obstetrics and Gynecology

## 2017-08-10 DIAGNOSIS — N6320 Unspecified lump in the left breast, unspecified quadrant: Secondary | ICD-10-CM

## 2017-08-14 ENCOUNTER — Ambulatory Visit
Admission: RE | Admit: 2017-08-14 | Discharge: 2017-08-14 | Disposition: A | Discharge status: Home / Self Care | Payer: No Typology Code available for payment source | Source: Ambulatory Visit | Attending: Obstetrics and Gynecology | Admitting: Obstetrics and Gynecology

## 2017-08-14 DIAGNOSIS — N6320 Unspecified lump in the left breast, unspecified quadrant: Secondary | ICD-10-CM

## 2017-08-15 ENCOUNTER — Other Ambulatory Visit: Payer: Self-pay | Admitting: Obstetrics and Gynecology

## 2017-08-15 DIAGNOSIS — Z853 Personal history of malignant neoplasm of breast: Secondary | ICD-10-CM

## 2017-08-15 DIAGNOSIS — N6320 Unspecified lump in the left breast, unspecified quadrant: Secondary | ICD-10-CM

## 2017-08-16 ENCOUNTER — Telehealth: Payer: Self-pay | Admitting: Oncology

## 2017-08-16 NOTE — Telephone Encounter (Signed)
Spoke with patient to confirm morning [facility] appointment for 2/20, packet e-mail to patient

## 2017-08-17 ENCOUNTER — Telehealth: Payer: Self-pay | Admitting: *Deleted

## 2017-08-17 NOTE — Telephone Encounter (Signed)
Received notification that pt will not come to [facility] at [facility]. She will go to [facility]

## 2017-08-21 ENCOUNTER — Other Ambulatory Visit (HOSPITAL_COMMUNITY): Payer: Self-pay | Admitting: General Surgery

## 2017-08-21 ENCOUNTER — Inpatient Hospital Stay
Admission: RE | Admit: 2017-08-21 | Discharge: 2017-08-21 | Disposition: A | Discharge status: Home / Self Care | Payer: Self-pay | Source: Ambulatory Visit | Attending: Obstetrics and Gynecology | Admitting: Obstetrics and Gynecology

## 2017-08-21 ENCOUNTER — Encounter: Payer: Self-pay | Admitting: *Deleted

## 2017-08-21 ENCOUNTER — Other Ambulatory Visit: Payer: Self-pay | Admitting: Obstetrics and Gynecology

## 2017-08-21 DIAGNOSIS — C50212 Malignant neoplasm of upper-inner quadrant of left female breast: Secondary | ICD-10-CM

## 2017-08-21 DIAGNOSIS — N6320 Unspecified lump in the left breast, unspecified quadrant: Secondary | ICD-10-CM

## 2017-08-21 DIAGNOSIS — Z171 Estrogen receptor negative status [ER-]: Principal | ICD-10-CM

## 2017-08-21 NOTE — Progress Notes (Signed)
  Oncology Nurse Navigator Documentation  Navigator Location: [facility] (08/21/17 1600) Referral date to RadOnc/MedOnc: 08/23/17 (08/21/17 1600) )Navigator Encounter Type: Introductory phone call (08/21/17 1600)   Abnormal Finding Date: 08/03/17 (08/21/17 1600) Confirmed Diagnosis Date: 08/14/17 (08/21/17 1600)                   Barriers/Navigation Needs: Coordination of Care (08/21/17 1600)                          Time Spent with Patient: 45 (08/21/17 1600)   Received referral from [facility], the scheduler to review referral.  Patient newly diagnosed with left breast cancer.  She was seen in the [facility] program and referred to Dr. Marlou Starks at [facility].  Dr. Marlou Starks has recommended neoadjuvant chemotherapy.  Talked to patient today.  She lives in [place] and it would be closer for her to get treatment here.  I have scheduled her to see Dr. Tasia Catchings on Wednesday at 11:00.  She is also coming by tomorrow at 11:00 to complete [employer] Medicaid application and educational literature.  She is to call if she has any questions.

## 2017-08-22 ENCOUNTER — Encounter: Payer: Self-pay | Admitting: *Deleted

## 2017-08-22 NOTE — Progress Notes (Signed)
  Oncology Nurse Navigator Documentation  Navigator Location: [facility] (08/22/17 1300)   )Navigator Encounter Type: Other (08/22/17 1300)                         Barriers/Navigation Needs: Financial (08/22/17 1300)   Interventions: Education (08/22/17 1300)     Education Method: Verbal;Written (08/22/17 1300)  Support Groups/Services: [facility];Breast Support Group (08/22/17 1300)             Time Spent with Patient: 45 (08/22/17 1300)   Met with patient today to complete [employer] Medicaid application.  Faxed to [place].  Gave patient breast cancer educational literature, "My Breast Cancer Treatment Handbook" by Josephine Igo, RN.  Reviewed diagnosis again with patient and need for genetic testing.  She is agreeable and states "I have 4 daughters, I need to know for them".  Will meet her tomorrow at her medical oncology consult.  She is to call if she has any questions or needs.

## 2017-08-23 ENCOUNTER — Inpatient Hospital Stay: Payer: Self-pay | Admitting: Oncology

## 2017-08-23 ENCOUNTER — Encounter: Payer: Self-pay | Admitting: *Deleted

## 2017-08-23 NOTE — Progress Notes (Signed)
  Oncology Nurse Navigator Documentation  Navigator Location: [facility] (08/23/17 1100)   )Navigator Encounter Type: Telephone (08/23/17 1100) Telephone: Outgoing Call (08/23/17 1100)                       Barriers/Navigation Needs: Coordination of Care (08/23/17 1100)                          Time Spent with Patient: 15 (08/23/17 1100)   Patient has not showed up for her medical oncology consult this morning.  Left her a message to return my call.

## 2017-08-24 ENCOUNTER — Encounter: Payer: Self-pay | Admitting: *Deleted

## 2017-08-25 ENCOUNTER — Inpatient Hospital Stay: Payer: Medicaid Other | Attending: Oncology | Admitting: Oncology

## 2017-08-25 ENCOUNTER — Ambulatory Visit: Payer: Self-pay | Admitting: General Surgery

## 2017-08-25 ENCOUNTER — Encounter: Payer: Self-pay | Admitting: *Deleted

## 2017-08-25 ENCOUNTER — Encounter: Payer: Self-pay | Admitting: Oncology

## 2017-08-25 ENCOUNTER — Inpatient Hospital Stay: Payer: Medicaid Other

## 2017-08-25 VITALS — BP 155/82 | HR 96 | Temp 98.3°F | Resp 18 | Ht 66.0 in | Wt 154.2 lb

## 2017-08-25 DIAGNOSIS — C50812 Malignant neoplasm of overlapping sites of left female breast: Secondary | ICD-10-CM | POA: Diagnosis present

## 2017-08-25 DIAGNOSIS — Z79899 Other long term (current) drug therapy: Secondary | ICD-10-CM | POA: Insufficient documentation

## 2017-08-25 DIAGNOSIS — Z171 Estrogen receptor negative status [ER-]: Secondary | ICD-10-CM | POA: Insufficient documentation

## 2017-08-25 DIAGNOSIS — K219 Gastro-esophageal reflux disease without esophagitis: Secondary | ICD-10-CM | POA: Diagnosis not present

## 2017-08-25 DIAGNOSIS — D509 Iron deficiency anemia, unspecified: Secondary | ICD-10-CM | POA: Diagnosis not present

## 2017-08-25 DIAGNOSIS — F17210 Nicotine dependence, cigarettes, uncomplicated: Secondary | ICD-10-CM | POA: Diagnosis not present

## 2017-08-25 DIAGNOSIS — C50912 Malignant neoplasm of unspecified site of left female breast: Secondary | ICD-10-CM

## 2017-08-25 LAB — COMPREHENSIVE METABOLIC PANEL
ALT: 9 U/L — ABNORMAL LOW (ref 14–54)
ANION GAP: 7 (ref 5–15)
AST: 19 U/L (ref 15–41)
Albumin: 3.8 g/dL (ref 3.5–5.0)
Alkaline Phosphatase: 26 U/L — ABNORMAL LOW (ref 38–126)
BILIRUBIN TOTAL: 0.3 mg/dL (ref 0.3–1.2)
BUN: 8 mg/dL (ref 6–20)
CALCIUM: 8.9 mg/dL (ref 8.9–10.3)
CO2: 24 mmol/L (ref 22–32)
Chloride: 109 mmol/L (ref 101–111)
Creatinine, Ser: 0.62 mg/dL (ref 0.44–1.00)
GFR calc non Af Amer: >60 mL/min (ref >60)
Glucose, Bld: 86 mg/dL (ref 65–99)
Potassium: 3.9 mmol/L (ref 3.5–5.1)
Sodium: 140 mmol/L (ref 135–145)
TOTAL PROTEIN: 7.1 g/dL (ref 6.5–8.1)

## 2017-08-25 LAB — CBC WITH DIFFERENTIAL/PLATELET
BASOS ABS: 0 10*3/uL (ref 0–0.1)
BASOS PCT: 1 %
Eosinophils Absolute: 0.1 10*3/uL (ref 0–0.7)
Eosinophils Relative: 1 %
HEMATOCRIT: 24.5 % — AB (ref 35.0–47.0)
HEMOGLOBIN: 7.4 g/dL — AB (ref 12.0–16.0)
Lymphocytes Relative: 24 %
Lymphs Abs: 1.1 10*3/uL (ref 1.0–3.6)
MCH: 22.5 pg — ABNORMAL LOW (ref 26.0–34.0)
MCHC: 30.2 g/dL — ABNORMAL LOW (ref 32.0–36.0)
MCV: 74.4 fL — ABNORMAL LOW (ref 80.0–100.0)
Monocytes Absolute: 0.6 10*3/uL (ref 0.2–0.9)
Monocytes Relative: 13 %
NEUTROS ABS: 2.9 10*3/uL (ref 1.4–6.5)
NEUTROS PCT: 61 %
Platelets: 310 10*3/uL (ref 150–440)
RBC: 3.29 MIL/uL — AB (ref 3.80–5.20)
RDW: 21.5 % — AB (ref 11.5–14.5)
WBC: 4.8 10*3/uL (ref 3.6–11.0)

## 2017-08-25 LAB — IRON AND TIBC
IRON: 8 ug/dL — AB (ref 28–170)
Saturation Ratios: 2 % — ABNORMAL LOW (ref 10.4–31.8)
TIBC: 409 ug/dL (ref 250–450)
UIBC: 401 ug/dL

## 2017-08-25 LAB — FERRITIN: FERRITIN: 5 ng/mL — AB (ref 11–307)

## 2017-08-25 NOTE — Progress Notes (Signed)
Hematology/Oncology Consult note [facility] [phone] [phone]   Patient Care Team: Patient, No Pcp Per as PCP - General (General Practice) Patient, No Pcp Per (General Practice)  REFERRING PROVIDER: Dr.Toth,Paul CHIEF COMPLAINTS/PURPOSE OF CONSULTATION:  Evaluation of breast cancer  HISTORY OF PRESENTING ILLNESS:  Cindy Brewer is a  47 y.o.  female with PMH listed below who was referred to me for evaluation of newly diagnosed breast cancer. Patient is a 47 year old female who felt a lump in the upper portion of the left breast about 3-4 months ago. Mammogram on August 03, 2017 showed 2 indeterminate hypoechoic masses at the 12:00 and 11:00.  These are the 2 masses corresponding to patient's palpable abnormality.There is also stable probably benign mass at the 2 o'clock position, possibly related to a complicated cyst or fibroadenoma.  #Patient underwent biopsy of the 12:00 and 11:00 mass and up pathology revealed both mass showed invasive ductal carcinoma, pathology commented that the carcinoma in the 2 specimen is somewhat similar and is grade 3.  The breast prognostic profile is performed on part 1, which showed ER PR negative and HER-2/neu negative, ki67 80%  #Patient supposed to have biopsy of the 2:00 mass which she missed the appointment.  Patient was seen and evaluated by surgeon Dr. Autumn Messing.  Given the size of the tumor and the fact that it is triple negative Dr. Marlou Starks referred the patient to see oncology to discuss about neoadjuvant chemotherapy.  Dr. Marlou Starks also ordered MRI of the breast and the patient has not have it done yet. Patient was a no-show on her original appointment with me.  Patient present to her appointment today.  She reports feeling well.  Denies any nipple discharge or breast pain.  She is a active daily smoker and she plans to quit.  She drinks alcohol occasionally on weekends.     Review of Systems    Constitutional: Negative for chills, fever, malaise/fatigue and weight loss.  HENT: Negative for hearing loss, nosebleeds and tinnitus.   Eyes: Negative for double vision and photophobia.  Respiratory: Negative for cough.   Cardiovascular: Negative for chest pain and palpitations.  Skin: Negative for rash.    MEDICAL HISTORY:  Past Medical History:  Diagnosis Date  . Fibroid   . GERD (gastroesophageal reflux disease)     SURGICAL HISTORY: Past Surgical History:  Procedure Laterality Date  . CESAREAN SECTION      SOCIAL HISTORY: Social History   Socioeconomic History  . Marital status: Divorced    Spouse name: Not on file  . Number of children: Not on file  . Years of education: Not on file  . Highest education level: Not on file  Social Needs  . Financial resource strain: Not on file  . Food insecurity - worry: Not on file  . Food insecurity - inability: Not on file  . Transportation needs - medical: Not on file  . Transportation needs - non-medical: Not on file  Occupational History  . Not on file  Tobacco Use  . Smoking status: Current Every Day Smoker  . Smokeless tobacco: Never Used  Substance and Sexual Activity  . Alcohol use: Yes    Comment: weekends  . Drug use: No  . Sexual activity: Not Currently  Other Topics Concern  . Not on file  Social History Narrative   ** Merged History Encounter **        FAMILY HISTORY: Family History  Problem Relation Age of  Onset  . Stroke Maternal Grandmother     ALLERGIES:  is allergic to demerol [meperidine]; percocet [oxycodone-acetaminophen]; sulfa antibiotics; percocet [oxycodone-acetaminophen]; and sulfa antibiotics.  MEDICATIONS:  Current Outpatient Medications  Medication Sig Dispense Refill  . cyclobenzaprine (FLEXERIL) 5 MG tablet Take 1 tablet (5 mg total) by mouth 3 (three) times daily as needed for muscle spasms. 15 tablet 0  . Multiple Vitamin (MULTIVITAMIN WITH MINERALS) TABS tablet Take 1 tablet  by mouth daily.    . cephALEXin (KEFLEX) 500 MG capsule Take 1 capsule (500 mg total) by mouth 2 (two) times daily. (Patient not taking: Reported on 08/03/2017) 14 capsule 0  . hydrochlorothiazide (HYDRODIURIL) 25 MG tablet Take 1 tablet (25 mg total) by mouth daily. (Patient not taking: Reported on 08/03/2017) 30 tablet 0  . Thiamine HCl (VITAMIN B-1) 250 MG tablet Take 250 mg by mouth daily.    . vitamin A 10000 UNIT capsule Take 10,000 Units by mouth daily.    . vitamin C (ASCORBIC ACID) 500 MG tablet Take 500 mg by mouth daily.     No current facility-administered medications for this visit.      PHYSICAL EXAMINATION: ECOG PERFORMANCE STATUS: 0 - Asymptomatic Vitals:   08/25/17 1357  BP: (!) 155/82  Pulse: 96  Resp: 18  Temp: 98.3 F (36.8 C)  SpO2: 99%   Filed Weights   08/25/17 1357  Weight: 154 lb 3.2 oz (69.9 kg)    Physical Exam  Constitutional: She is oriented to person, place, and time and well-developed, well-nourished, and in no distress. No distress.  HENT:  Head: Normocephalic and atraumatic.  Mouth/Throat: No oropharyngeal exudate.  Eyes: EOM are normal. Pupils are equal, round, and reactive to light. No scleral icterus.  Neck: Normal range of motion. Neck supple.  Cardiovascular: Normal rate, regular rhythm and normal heart sounds.  No murmur heard. Pulmonary/Chest: Effort normal and breath sounds normal. No respiratory distress. She has no wheezes.  Abdominal: Soft. Bowel sounds are normal. She exhibits no distension and no mass. There is no tenderness.  Musculoskeletal: Normal range of motion. She exhibits no edema.  Lymphadenopathy:    She has no cervical adenopathy.  Neurological: She is alert and oriented to person, place, and time.  Skin: Skin is warm and dry.  Psychiatric: Affect normal.  Breast exam was performed in seated and lying down position. Two Upper Left breast palpable masses. No palpable mass right breast. No palpable axillary lymph nodes      LABORATORY DATA:  I have reviewed the data as listed Lab Results  Component Value Date   WBC 4.8 08/25/2017   HGB 7.4 (L) 08/25/2017   HCT 24.5 (L) 08/25/2017   MCV 74.4 (L) 08/25/2017   PLT 310 08/25/2017   Recent Labs    03/31/17 1420  NA 142  K 4.3  CL 108  CO2 25  GLUCOSE 99  BUN 9  CREATININE 0.76  CALCIUM 9.1  GFRNONAA >60  GFRAA >60       ASSESSMENT & PLAN:  Cancer Staging Malignant neoplasm of overlapping sites of left breast in female, estrogen receptor negative ([place]) Staging form: Breast, AJCC 8th Edition - Clinical stage from 08/25/2017: Stage IIB (cT2(2), cN0, cM0, G3, ER: Negative, PR: Negative, HER2: Negative) - Signed by Earlie Server, MD on 08/25/2017  1. Malignant neoplasm of left breast in female, estrogen receptor negative, unspecified site of breast ([place])   2. 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  Dr.Toth has ordered MRI breast for further evaluation. Patient also missed her appointment for a third biopsy of 2' clock mass.  I agree with obtaining MRI breast and encourage patient to obtain test ASAP.  # refer to genetics for genetic testing.  # Given the size of tumor and triple negative breast cancer, I recommend neoadjuvant chemotherapy with ddAC-T.   I explained to the patient the risks and benefits of chemotherapy including all but not limited to hair loss, mouth sore, nausea, vomiting, low blood counts, bleeding, and risk of life threatening infection and even death, secondary malignancy etc.  # Chemotherapy education; port placement.   # check CBC and CMP today.  Labs obtained today revealed microcytic anemia consistent with severe iron deficiency.  Plan Venofer weekly x 4.    All questions were answered. The patient knows to call the clinic with any problems  questions or concerns.  Return of visit: 1 week for IV iron.  Thank you for this kind referral and the opportunity to participate in the care of this patient. A copy of today's note is routed to referring provider    Earlie Server, MD, PhD Hematology Oncology [facility] Pager- [phone] 08/25/2017

## 2017-08-25 NOTE — Progress Notes (Signed)
  Oncology Nurse Navigator Documentation  Navigator Location: [facility] (08/25/17 1500)   )Navigator Encounter Type: Initial MedOnc (08/25/17 1500)                     Patient Visit Type: Initial (08/25/17 1500) Treatment Phase: Pre-Tx/Tx Discussion (08/25/17 1500) Barriers/Navigation Needs: Coordination of Care (08/25/17 1500)   Interventions: Coordination of Care (08/25/17 1500)                      Time Spent with Patient: 60 (08/25/17 1500)   Met with patient today during her initial medical oncology visit with Dr. Tasia Catchings.  Offered support.  Patient did not go for her breast MRI or 3rd biopsy.  Discussed with Colletta Maryland at Dr. Ethlyn Gallery office to see if they wanted to reschedule those appointments or for [place] to take care of those.  She wants [place] to set up the new appointments, and she will discuss the port a cath placement at [place] with Dr. Marlou Starks, and let me know a date on Monday.  We will get patient an appointment to return to see Dr. Tasia Catchings after her next biopsy.  Chemo class to be scheduled also and possible CT scan.  Will follow-up with Colletta Maryland on Monday.  Patient to call me if she does not have appointments by the first of the week.

## 2017-08-26 LAB — CANCER ANTIGEN 15-3: CA 15-3: 20.9 U/mL (ref 0.0–25.0)

## 2017-08-26 LAB — CANCER ANTIGEN 27.29: CA 27.29: 23.6 U/mL (ref 0.0–38.6)

## 2017-08-28 ENCOUNTER — Encounter (HOSPITAL_COMMUNITY): Payer: Self-pay | Admitting: *Deleted

## 2017-08-29 ENCOUNTER — Encounter: Payer: Self-pay | Admitting: *Deleted

## 2017-08-29 NOTE — Progress Notes (Signed)
The patient did not return my call, but I called her again and she answered.  States "I am just getting too many phone calls".  Reviewed plan for appointments for biopsy, MRI and port a cath placement.  States she did not return any calls.  I informed her of the message I received from Dr. Ethlyn Gallery office refusing an appointment for the port a cath, and stating she did not want to have chemo.  She did confirm she does not want chemo.  I expressed the importance of meeting with Dr. Tasia Catchings again to discuss treatment options so she could have a good understanding to make good decisions for herself.  She was very hesitant to commit to an appointment, but agreed to come in tomorrow at 9:30.  I stressed the importance of having a plan of care, and that nothing was scheduled at this time.  Will try and call her again in the morning prior to her appointment.

## 2017-08-29 NOTE — Progress Notes (Signed)
  Oncology Nurse Navigator Documentation  Navigator Location: [facility] (08/29/17 0800)   )Navigator Encounter Type: Telephone (08/29/17 0800) Telephone: [place] [facility] Call (08/29/17 0800)                                                  Time Spent with Patient: 15 (08/29/17 0800)   Received message from Carlene Coria at [facility] that the patient had told their scheduler she was not having chemo and refused appointment for her port a cath.  The office has not been able to get in touch with her.  I also called and spoke with Aldona Bar at the [facility] and she has left the patient a message to schedule her biopsy.  I called the patient today to have her return to see Dr. Tasia Catchings for further discussion.  I have left her a message to return my call.

## 2017-08-30 ENCOUNTER — Encounter: Payer: Self-pay | Admitting: *Deleted

## 2017-08-30 ENCOUNTER — Inpatient Hospital Stay: Payer: Medicaid Other | Admitting: Oncology

## 2017-08-30 NOTE — Progress Notes (Signed)
  Oncology Nurse Navigator Documentation  Navigator Location: [facility] (08/30/17 0800)   )Navigator Encounter Type: Telephone (08/30/17 0800) Telephone: [place] Cro[facility] Call (08/30/17 0800)                                                  Time Spent with Patient: 15 (08/30/17 0800)   Left patient a message reminding her of her 9:30 appointment today.

## 2017-08-31 ENCOUNTER — Encounter: Payer: Self-pay | Admitting: *Deleted

## 2017-08-31 ENCOUNTER — Other Ambulatory Visit: Payer: Self-pay | Admitting: Oncology

## 2017-08-31 NOTE — Progress Notes (Signed)
  Oncology Nurse Navigator Documentation  Navigator Location: [facility] (08/31/17 1100)   )Navigator Encounter Type: Telephone (08/31/17 1100) Telephone: [place] Call (08/31/17 1100)                                                  Time Spent with Patient: 15 (08/31/17 1100)   Patient missed her appointment yesterday.  Left her a message to return my call.

## 2017-09-04 ENCOUNTER — Encounter: Payer: Self-pay | Admitting: *Deleted

## 2017-09-04 ENCOUNTER — Inpatient Hospital Stay: Payer: Medicaid Other

## 2017-09-04 ENCOUNTER — Inpatient Hospital Stay: Payer: Medicaid Other | Admitting: Oncology

## 2017-09-04 NOTE — Progress Notes (Signed)
  Oncology Nurse Navigator Documentation  Navigator Location: [facility] (09/04/17 0800)   )Navigator Encounter Type: Telephone (09/04/17 0800) Telephone: Incoming Call (09/04/17 0800)                                                  Time Spent with Patient: 15 (09/04/17 0800)   Patient left me a message that she needed to reschedule her appointment for today.  I have called her back to reschedule but her voicemail is full.  I was unable to leave her a message.

## 2017-09-05 ENCOUNTER — Inpatient Hospital Stay (HOSPITAL_BASED_OUTPATIENT_CLINIC_OR_DEPARTMENT_OTHER): Payer: Medicaid Other | Admitting: Oncology

## 2017-09-05 ENCOUNTER — Inpatient Hospital Stay: Payer: Medicaid Other

## 2017-09-05 ENCOUNTER — Other Ambulatory Visit: Payer: Self-pay | Admitting: *Deleted

## 2017-09-05 ENCOUNTER — Encounter: Payer: Self-pay | Admitting: *Deleted

## 2017-09-05 ENCOUNTER — Inpatient Hospital Stay: Payer: Medicaid Other | Attending: Oncology

## 2017-09-05 ENCOUNTER — Encounter: Payer: Self-pay | Admitting: Oncology

## 2017-09-05 VITALS — BP 122/84 | HR 97 | Temp 98.9°F | Resp 18 | Ht 66.0 in | Wt 151.2 lb

## 2017-09-05 VITALS — BP 125/86 | HR 76

## 2017-09-05 DIAGNOSIS — C50812 Malignant neoplasm of overlapping sites of left female breast: Secondary | ICD-10-CM | POA: Diagnosis not present

## 2017-09-05 DIAGNOSIS — F419 Anxiety disorder, unspecified: Secondary | ICD-10-CM

## 2017-09-05 DIAGNOSIS — D509 Iron deficiency anemia, unspecified: Secondary | ICD-10-CM | POA: Insufficient documentation

## 2017-09-05 DIAGNOSIS — Z79899 Other long term (current) drug therapy: Secondary | ICD-10-CM

## 2017-09-05 DIAGNOSIS — Z171 Estrogen receptor negative status [ER-]: Principal | ICD-10-CM

## 2017-09-05 DIAGNOSIS — R4589 Other symptoms and signs involving emotional state: Secondary | ICD-10-CM

## 2017-09-05 LAB — CBC WITH DIFFERENTIAL/PLATELET
BASOS ABS: 0.1 10*3/uL (ref 0–0.1)
Basophils Relative: 1 %
EOS PCT: 2 %
Eosinophils Absolute: 0.1 10*3/uL (ref 0–0.7)
HEMATOCRIT: 28.2 % — AB (ref 35.0–47.0)
Hemoglobin: 8.4 g/dL — ABNORMAL LOW (ref 12.0–16.0)
LYMPHS ABS: 1.1 10*3/uL (ref 1.0–3.6)
LYMPHS PCT: 20 %
MCH: 22.3 pg — AB (ref 26.0–34.0)
MCHC: 29.7 g/dL — AB (ref 32.0–36.0)
MCV: 75.2 fL — AB (ref 80.0–100.0)
MONO ABS: 0.6 10*3/uL (ref 0.2–0.9)
MONOS PCT: 12 %
NEUTROS ABS: 3.6 10*3/uL (ref 1.4–6.5)
Neutrophils Relative %: 65 %
Platelets: 258 10*3/uL (ref 150–440)
RBC: 3.75 MIL/uL — ABNORMAL LOW (ref 3.80–5.20)
RDW: 22.2 % — AB (ref 11.5–14.5)
WBC: 5.4 10*3/uL (ref 3.6–11.0)

## 2017-09-05 LAB — COMPREHENSIVE METABOLIC PANEL
ALBUMIN: 4 g/dL (ref 3.5–5.0)
ALT: 8 U/L — ABNORMAL LOW (ref 14–54)
AST: 23 U/L (ref 15–41)
Alkaline Phosphatase: 28 U/L — ABNORMAL LOW (ref 38–126)
Anion gap: 7 (ref 5–15)
BUN: 8 mg/dL (ref 6–20)
CHLORIDE: 109 mmol/L (ref 101–111)
CO2: 23 mmol/L (ref 22–32)
Calcium: 8.9 mg/dL (ref 8.9–10.3)
Creatinine, Ser: 0.68 mg/dL (ref 0.44–1.00)
GFR calc Af Amer: >60 mL/min (ref >60)
GFR calc non Af Amer: >60 mL/min (ref >60)
GLUCOSE: 97 mg/dL (ref 65–99)
POTASSIUM: 4.1 mmol/L (ref 3.5–5.1)
SODIUM: 139 mmol/L (ref 135–145)
Total Bilirubin: 0.5 mg/dL (ref 0.3–1.2)
Total Protein: 7.9 g/dL (ref 6.5–8.1)

## 2017-09-05 MED ORDER — ALPRAZOLAM 0.25 MG PO TABS: 0.2500 mg | ORAL_TABLET | Freq: Two times a day (BID) | ORAL | 0 refills | Status: DC | PRN

## 2017-09-05 MED ORDER — IRON SUCROSE 20 MG/ML IV SOLN
200.0000 mg | Freq: Once | INTRAVENOUS | Status: AC
Start: 2017-09-05 — End: 2017-09-05
  Administered 2017-09-05: 200 mg via INTRAVENOUS
  Filled 2017-09-05: qty 10

## 2017-09-05 MED ORDER — SODIUM CHLORIDE 0.9 % IV SOLN
Freq: Once | INTRAVENOUS | Status: AC
  Administered 2017-09-05: 13:00:00 via INTRAVENOUS
  Filled 2017-09-05: qty 1000

## 2017-09-05 NOTE — Progress Notes (Signed)
No new changes noted today 

## 2017-09-05 NOTE — Progress Notes (Signed)
Hematology/Oncology Consult note [facility] [phone] Fax:(336) [phone]   Patient Care Team: Patient, No Pcp Per as PCP - General (General Practice) Patient, No Pcp Per (General Practice)  REFERRING PROVIDER: Dr.Toth,Paul CHIEF COMPLAINTS/PURPOSE OF CONSULTATION:  Evaluation of breast cancer  HISTORY OF PRESENTING ILLNESS:  Cindy Brewer is a  47 y.o.  female with PMH listed below who was referred to me for evaluation of newly diagnosed breast cancer. Patient is a 47 year old female who felt a lump in the upper portion of the left breast about 3-4 months ago. Mammogram on August 03, 2017 showed 2 indeterminate hypoechoic masses at the 12:00 and 11:00.  These are the 2 masses corresponding to patient's palpable abnormality.There is also stable probably benign mass at the 2 o'clock position, possibly related to a complicated cyst or fibroadenoma.  #Patient underwent biopsy of the 12:00 and 11:00 mass and up pathology revealed both mass showed invasive ductal carcinoma, pathology commented that the carcinoma in the 2 specimen is somewhat similar and is grade 3.  The breast prognostic profile is performed on part 1, which showed ER PR negative and HER-2/neu negative, ki67 80%  #Patient supposed to have biopsy of the 2:00 mass which she missed the appointment.  Patient was seen and evaluated by surgeon Dr. Autumn Messing.  Given the size of the tumor and the fact that it is triple negative Dr. Marlou Starks referred the patient to see oncology to discuss about neoadjuvant chemotherapy.  Dr. Marlou Starks also ordered MRI of the breast and the patient has not have it done yet. Patient was a no-show on her original appointment with me.  Patient present to her appointment today.  She reports feeling well.  Denies any nipple discharge or breast pain.  She is a active daily smoker and she plans to quit.  She drinks alcohol occasionally on weekends.  INTERVAL HISTORY Cindy  Hope Brewer is a 47 y.o. female who has above history reviewed by me today presents for follow up visit for management of triple negative breast cancer and iron deficiency.  Since patient's last visit with me with discussion of neoadjuvant chemotherapy, patient has no showed twice for an follow-up appointment.  She also has informed her surgeon at [facility] that she is not interested in chemotherapy and not going to get a port placed. RN navigator Warren and I have tried multiple times to reach her.  Finally she presented to my clinic. She feels tired and tells me that she feels overwhelmed about all the phone calls for scheduling, and is scared about getting chemotherapy.  She is not willing to discuss more due to anxiety.  She says she is not getting much sleep at night.  She is only open for discussion about treatment of her iron deficiency.   Review of Systems  Constitutional: Positive for malaise/fatigue. Negative for chills, fever and weight loss.  HENT: Negative for hearing loss, nosebleeds and tinnitus.   Eyes: Negative for double vision and photophobia.  Respiratory: Negative for cough.   Cardiovascular: Negative for chest pain and palpitations.  Gastrointestinal: Negative for abdominal pain, heartburn and nausea.  Genitourinary: Negative for frequency and urgency.  Skin: Negative for rash.  Neurological: Negative for tingling and headaches.  Endo/Heme/Allergies: Negative for environmental allergies.  Psychiatric/Behavioral: Negative for substance abuse. The patient is nervous/anxious.     MEDICAL HISTORY:  Past Medical History:  Diagnosis Date  . Fibroid   . GERD (gastroesophageal reflux disease)     SURGICAL HISTORY:  Past Surgical History:  Procedure Laterality Date  . CESAREAN SECTION      SOCIAL HISTORY: Social History   Socioeconomic History  . Marital status: Divorced    Spouse name: Not on file  . Number of children: Not on file  . Years of education: Not on  file  . Highest education level: Not on file  Social Needs  . Financial resource strain: Not on file  . Food insecurity - worry: Not on file  . Food insecurity - inability: Not on file  . Transportation needs - medical: Not on file  . Transportation needs - non-medical: Not on file  Occupational History  . Not on file  Tobacco Use  . Smoking status: Current Every Day Smoker  . Smokeless tobacco: Never Used  Substance and Sexual Activity  . Alcohol use: Yes    Comment: weekends  . Drug use: No  . Sexual activity: Not Currently  Other Topics Concern  . Not on file  Social History Narrative   ** Merged History Encounter **        FAMILY HISTORY: Family History  Problem Relation Age of Onset  . Stroke Maternal Grandmother     ALLERGIES:  is allergic to demerol [meperidine]; percocet [oxycodone-acetaminophen]; sulfa antibiotics; percocet [oxycodone-acetaminophen]; and sulfa antibiotics.  MEDICATIONS:  Current Outpatient Medications  Medication Sig Dispense Refill  . ALPRAZolam (XANAX) 0.25 MG tablet Take 1 tablet (0.25 mg total) by mouth 2 (two) times daily as needed for anxiety. 30 tablet 0  . cephALEXin (KEFLEX) 500 MG capsule Take 1 capsule (500 mg total) by mouth 2 (two) times daily. (Patient not taking: Reported on 08/03/2017) 14 capsule 0  . cyclobenzaprine (FLEXERIL) 5 MG tablet Take 1 tablet (5 mg total) by mouth 3 (three) times daily as needed for muscle spasms. 15 tablet 0  . hydrochlorothiazide (HYDRODIURIL) 25 MG tablet Take 1 tablet (25 mg total) by mouth daily. (Patient not taking: Reported on 08/03/2017) 30 tablet 0  . Multiple Vitamin (MULTIVITAMIN WITH MINERALS) TABS tablet Take 1 tablet by mouth daily.    . Thiamine HCl (VITAMIN B-1) 250 MG tablet Take 250 mg by mouth daily.    . vitamin A 10000 UNIT capsule Take 10,000 Units by mouth daily.    . vitamin C (ASCORBIC ACID) 500 MG tablet Take 500 mg by mouth daily.     No current facility-administered medications  for this visit.      PHYSICAL EXAMINATION: ECOG PERFORMANCE STATUS: 0 - Asymptomatic Vitals:   09/05/17 1207  BP: 122/84  Pulse: 97  Resp: 18  Temp: 98.9 F (37.2 C)  SpO2: 99%   Filed Weights   09/05/17 1207  Weight: 151 lb 3.2 oz (68.6 kg)    Physical Exam  Constitutional: She is oriented to person, place, and time and well-developed, well-nourished, and in no distress. No distress.  HENT:  Head: Normocephalic and atraumatic.  Mouth/Throat: No oropharyngeal exudate.  Eyes: EOM are normal. Pupils are equal, round, and reactive to light. No scleral icterus.  Neck: Normal range of motion. Neck supple.  Cardiovascular: Normal rate, regular rhythm and normal heart sounds.  No murmur heard. Pulmonary/Chest: Effort normal and breath sounds normal. No respiratory distress. She has no wheezes.  Abdominal: Soft. Bowel sounds are normal. She exhibits no distension and no mass. There is no tenderness.  Musculoskeletal: Normal range of motion. She exhibits no edema.  Lymphadenopathy:    She has no cervical adenopathy.  Neurological: She is alert and oriented  to person, place, and time.  Skin: Skin is warm and dry. No erythema.  Psychiatric: Affect and judgment normal.  Breast exam was performed in seated and lying down position. Two Upper Left breast palpable masses. No palpable mass right breast. No palpable axillary lymph nodes     LABORATORY DATA:  I have reviewed the data as listed Lab Results  Component Value Date   WBC 5.4 09/05/2017   HGB 8.4 (L) 09/05/2017   HCT 28.2 (L) 09/05/2017   MCV 75.2 (L) 09/05/2017   PLT 258 09/05/2017   Recent Labs    03/31/17 1420 08/25/17 1514 09/05/17 1154  NA 142 140 139  K 4.3 3.9 4.1  CL 108 109 109  CO2 _0 GLUCOSE 99 86 97  BUN _1 CREATININE 0.76 0.62 0.68  CALCIUM 9.1 8.9 8.9  GFRNONAA >60 >60 >60  GFRAA >60 >60 >60  PROT  --  7.1 7.9  ALBUMIN  --  3.8 4.0  AST  --  19 23  ALT  --  9* 8*  ALKPHOS  --  26*  28*  BILITOT  --  0.3 0.5       ASSESSMENT & PLAN:  Cancer Staging Malignant neoplasm of overlapping sites of left breast in female, estrogen receptor negative ([place]) Staging form: Breast, AJCC 8th Edition - Clinical stage from 08/25/2017: Stage IIB (cT2(2), cN0, cM0, G3, ER: Negative, PR: Negative, HER2: Negative) - Signed by Earlie Server, MD on 08/25/2017  1. Malignant neoplasm of overlapping sites of left breast in female, estrogen receptor negative ([place])   2. Denial   3. Iron deficiency anemia, unspecified iron deficiency anemia type   4. Anxiety   Multifocal triple negative breast cancer clinically Stage IIB (cT2 cN0,cM0) Discuss about iron deficiency with patient which is the only subject she is open for discussion today. Plan IV iron with Venofer 228m twice a week for 4 doses. Allergy reactions/infusion reaction including anaphylactic reaction discussed with patient. Patient voices understanding and willing to proceed.  She hopes that iron infusion can improve her energy which may eventually help her too bad of her cancer.  She clearly stated that she does not want any chemotherapy prior or after surgery.  Discussed with her that if that is the case, I recommend surgery as soon as possible.  She is okay for CT scan if this can be scheduled on the same day of her next iron treatment.  We will hold MRI examination.   #Denial and Anxiety: Suggest patient to try Xanax 0.25 mg BID as needed. Advise not to use prior to driving. She voices understanding. Rx sent to her preferred pharmacy.    ll questions were answered. The patient knows to call the clinic with any problems questions or concerns.  Return of visit: 2- 3 days for IV iron.  Thank you for this kind referral and the opportunity to participate in the care of this patient. A copy of today's note is routed to referring provider    ZEarlie Server MD, PhD Hematology Oncology [facility] Surgery CenterPager-  [phone]/11/2017

## 2017-09-07 ENCOUNTER — Encounter: Payer: Self-pay | Admitting: Oncology

## 2017-09-07 ENCOUNTER — Inpatient Hospital Stay (HOSPITAL_BASED_OUTPATIENT_CLINIC_OR_DEPARTMENT_OTHER): Payer: Medicaid Other | Admitting: Oncology

## 2017-09-07 ENCOUNTER — Encounter: Payer: Self-pay | Admitting: *Deleted

## 2017-09-07 ENCOUNTER — Inpatient Hospital Stay: Payer: Medicaid Other

## 2017-09-07 VITALS — BP 121/79 | HR 91 | Temp 97.9°F | Resp 16 | Wt 155.0 lb

## 2017-09-07 DIAGNOSIS — D509 Iron deficiency anemia, unspecified: Secondary | ICD-10-CM

## 2017-09-07 DIAGNOSIS — Z79899 Other long term (current) drug therapy: Secondary | ICD-10-CM | POA: Diagnosis not present

## 2017-09-07 DIAGNOSIS — F419 Anxiety disorder, unspecified: Secondary | ICD-10-CM

## 2017-09-07 DIAGNOSIS — Z171 Estrogen receptor negative status [ER-]: Principal | ICD-10-CM

## 2017-09-07 DIAGNOSIS — C50812 Malignant neoplasm of overlapping sites of left female breast: Secondary | ICD-10-CM

## 2017-09-07 DIAGNOSIS — R4589 Other symptoms and signs involving emotional state: Secondary | ICD-10-CM

## 2017-09-07 MED ORDER — SODIUM CHLORIDE 0.9 % IV SOLN
Freq: Once | INTRAVENOUS | Status: AC
  Administered 2017-09-07: 15:00:00 via INTRAVENOUS
  Filled 2017-09-07: qty 1000

## 2017-09-07 MED ORDER — IRON SUCROSE 20 MG/ML IV SOLN
200.0000 mg | Freq: Once | INTRAVENOUS | Status: AC
  Administered 2017-09-07: 200 mg via INTRAVENOUS
  Filled 2017-09-07: qty 10

## 2017-09-07 NOTE — Progress Notes (Signed)
Hematology/Oncology Consult note [facility] Telephone:([phone] [phone]   Patient Care Team: Patient, No Pcp Per as PCP - General (General Practice) Patient, No Pcp Per (General Practice)  REFERRING PROVIDER: Dr.Toth,Paul CHIEF COMPLAINTS/PURPOSE OF CONSULTATION:  Evaluation of breast cancer  HISTORY OF PRESENTING ILLNESS:  Cindy Brewer is a  47 y.o.  female with PMH listed below who was referred to me for evaluation of newly diagnosed breast cancer. Patient is a 47 year old female who felt a lump in the upper portion of the left breast about 3-4 months ago. Mammogram on August 03, 2017 showed 2 indeterminate hypoechoic masses at the 12:00 and 11:00.  These are the 2 masses corresponding to patient's palpable abnormality.There is also stable probably benign mass at the 2 o'clock position, possibly related to a complicated cyst or fibroadenoma.  #Patient underwent biopsy of the 12:00 and 11:00 mass and up pathology revealed both mass showed invasive ductal carcinoma, pathology commented that the carcinoma in the 2 specimen is somewhat similar and is grade 3.  The breast prognostic profile is performed on part 1, which showed ER PR negative and HER-2/neu negative, ki67 80%  #Patient supposed to have biopsy of the 2:00 mass which she missed the appointment.  Patient was seen and evaluated by surgeon Dr. Autumn Messing.  Given the size of the tumor and the fact that it is triple negative Dr. Marlou Starks referred the patient to see oncology to discuss about neoadjuvant chemotherapy.  Dr. Marlou Starks also ordered MRI of the breast and the patient has not have it done yet. Patient no showed a few appointment with me.  RN navigator [place] and I have tried multiple times to reach her.RN navigator [place] and I have tried multiple times to reach her.  INTERVAL HISTORY Cindy Brewer is a 47 y.o. female who has above history reviewed by me today presents for follow  up visit for management of triple negative breast cancer and iron deficiency.  Patient reports feeling well today. She tolerates IV Venofer well.    Review of Systems  Constitutional: Positive for malaise/fatigue. Negative for chills, fever and weight loss.  HENT: Negative for ear discharge, hearing loss, nosebleeds and tinnitus.   Eyes: Negative for double vision and photophobia.  Respiratory: Negative for cough, sputum production and shortness of breath.   Cardiovascular: Negative for chest pain and palpitations.  Gastrointestinal: Negative for abdominal pain, constipation, diarrhea, heartburn and nausea.  Genitourinary: Negative for frequency and urgency.  Musculoskeletal: Negative for back pain and neck pain.  Skin: Negative for rash.  Neurological: Negative for tingling, sensory change, weakness and headaches.  Endo/Heme/Allergies: Negative for environmental allergies.  Psychiatric/Behavioral: Negative for substance abuse. The patient is nervous/anxious.     MEDICAL HISTORY:  Past Medical History:  Diagnosis Date  . Fibroid   . GERD (gastroesophageal reflux disease)     SURGICAL HISTORY: Past Surgical History:  Procedure Laterality Date  . CESAREAN SECTION      SOCIAL HISTORY: Social History   Socioeconomic History  . Marital status: Divorced    Spouse name: Not on file  . Number of children: Not on file  . Years of education: Not on file  . Highest education level: Not on file  Social Needs  . Financial resource strain: Not on file  . Food insecurity - worry: Not on file  . Food insecurity - inability: Not on file  . Transportation needs - medical: Not on file  . Transportation needs - non-medical:  Not on file  Occupational History  . Not on file  Tobacco Use  . Smoking status: Current Every Day Smoker  . Smokeless tobacco: Never Used  Substance and Sexual Activity  . Alcohol use: Yes    Comment: weekends  . Drug use: No  . Sexual activity: Not Currently    Other Topics Concern  . Not on file  Social History Narrative   ** Merged History Encounter **        FAMILY HISTORY: Family History  Problem Relation Age of Onset  . Stroke Maternal Grandmother     ALLERGIES:  is allergic to demerol [meperidine]; percocet [oxycodone-acetaminophen]; sulfa antibiotics; percocet [oxycodone-acetaminophen]; and sulfa antibiotics.  MEDICATIONS:  Current Outpatient Medications  Medication Sig Dispense Refill  . ALPRAZolam (XANAX) 0.25 MG tablet Take 1 tablet (0.25 mg total) by mouth 2 (two) times daily as needed for anxiety. 30 tablet 0  . cephALEXin (KEFLEX) 500 MG capsule Take 1 capsule (500 mg total) by mouth 2 (two) times daily. (Patient not taking: Reported on 08/03/2017) 14 capsule 0  . cyclobenzaprine (FLEXERIL) 5 MG tablet Take 1 tablet (5 mg total) by mouth 3 (three) times daily as needed for muscle spasms. 15 tablet 0  . hydrochlorothiazide (HYDRODIURIL) 25 MG tablet Take 1 tablet (25 mg total) by mouth daily. (Patient not taking: Reported on 08/03/2017) 30 tablet 0  . Multiple Vitamin (MULTIVITAMIN WITH MINERALS) TABS tablet Take 1 tablet by mouth daily.    . Thiamine HCl (VITAMIN B-1) 250 MG tablet Take 250 mg by mouth daily.    . vitamin A 10000 UNIT capsule Take 10,000 Units by mouth daily.    . vitamin C (ASCORBIC ACID) 500 MG tablet Take 500 mg by mouth daily.     No current facility-administered medications for this visit.      PHYSICAL EXAMINATION: ECOG PERFORMANCE STATUS: 0 - Asymptomatic Vitals:   09/07/17 1415  BP: 121/79  Pulse: 91  Resp: 16  Temp: 97.9 F (36.6 C)   Filed Weights   09/07/17 1415  Weight: 155 lb (70.3 kg)    Physical Exam  Constitutional: She is oriented to person, place, and time and well-developed, well-nourished, and in no distress. No distress.  HENT:  Head: Normocephalic and atraumatic.  Mouth/Throat: Oropharynx is clear and moist. No oropharyngeal exudate.  Eyes: EOM are normal. Pupils are  equal, round, and reactive to light. Left eye exhibits no discharge. No scleral icterus.  Neck: Normal range of motion. Neck supple.  Cardiovascular: Normal rate, regular rhythm and normal heart sounds.  No murmur heard. Pulmonary/Chest: Effort normal and breath sounds normal. No respiratory distress. She has no wheezes.  Abdominal: Soft. Bowel sounds are normal. She exhibits no distension and no mass. There is no tenderness.  Musculoskeletal: Normal range of motion. She exhibits no edema.  Lymphadenopathy:    She has no cervical adenopathy.  Neurological: She is alert and oriented to person, place, and time. No cranial nerve deficit.  Skin: Skin is warm and dry. No rash noted. No erythema.  Psychiatric: Memory, affect and judgment normal.       LABORATORY DATA:  I have reviewed the data as listed Lab Results  Component Value Date   WBC 5.4 09/05/2017   HGB 8.4 (L) 09/05/2017   HCT 28.2 (L) 09/05/2017   MCV 75.2 (L) 09/05/2017   PLT 258 09/05/2017   Recent Labs    03/31/17 1420 08/25/17 1514 09/05/17 1154  NA 142 140 139  K 4.3  3.9 4.1  CL 108 109 109  CO2 _0 GLUCOSE 99 86 97  BUN _1 CREATININE 0.76 0.62 0.68  CALCIUM 9.1 8.9 8.9  GFRNONAA >60 >60 >60  GFRAA >60 >60 >60  PROT  --  7.1 7.9  ALBUMIN  --  3.8 4.0  AST  --  19 23  ALT  --  9* 8*  ALKPHOS  --  26* 28*  BILITOT  --  0.3 0.5       ASSESSMENT & PLAN:  Cancer Staging Malignant neoplasm of overlapping sites of left breast in female, estrogen receptor negative ([place]) Staging form: Breast, AJCC 8th Edition - Clinical stage from 08/25/2017: Stage IIB (cT2(2), cN0, cM0, G3, ER: Negative, PR: Negative, HER2: Negative) - Signed by Earlie Server, MD on 08/25/2017  1. Malignant neoplasm of overlapping sites of left breast in female, estrogen receptor negative ([place])   2. Iron deficiency anemia, unspecified iron deficiency anemia type   3. Denial   4. Anxiety   Multifocal triple negative breast cancer  clinically Stage IIB (cT2 cN0,cM0) Tolerates first dose of Venofer well. She is interested to have more iron infusion. Will proceed with another dose of Venofer today.  Plan Venofer twice a week x 2 doses next week.   #Denial and Anxiety:Continue Xanax 0.25 mg BID as needed.  # Breast Cancer: she needs to follow up with Dr.Toth for surgery ASAP.  CT chest abdomen pelvis to be scheduled.   ll questions were answered. The patient knows to call the clinic with any problems questions or concerns.  Return of visit: 1 week after surgery to discuss about pathology.     Earlie Server, MD, PhD Hematology Oncology [facility] at [facility] Pager- [phone] 09/07/2017

## 2017-09-14 ENCOUNTER — Ambulatory Visit: Admission: RE | Admit: 2017-09-14 | Payer: Medicaid Other | Source: Ambulatory Visit

## 2017-09-14 ENCOUNTER — Encounter: Payer: Self-pay | Admitting: *Deleted

## 2017-09-14 ENCOUNTER — Inpatient Hospital Stay: Payer: Medicaid Other

## 2017-09-14 ENCOUNTER — Ambulatory Visit: Payer: Self-pay

## 2017-09-14 ENCOUNTER — Encounter: Payer: Self-pay | Admitting: Oncology

## 2017-09-14 ENCOUNTER — Telehealth: Payer: Self-pay | Admitting: Oncology

## 2017-09-14 VITALS — BP 130/81 | HR 81 | Temp 97.0°F | Resp 18

## 2017-09-14 DIAGNOSIS — C50812 Malignant neoplasm of overlapping sites of left female breast: Secondary | ICD-10-CM

## 2017-09-14 DIAGNOSIS — Z171 Estrogen receptor negative status [ER-]: Principal | ICD-10-CM

## 2017-09-14 MED ORDER — IRON SUCROSE 20 MG/ML IV SOLN
200.0000 mg | Freq: Once | INTRAVENOUS | Status: AC
  Administered 2017-09-14: 200 mg via INTRAVENOUS
  Filled 2017-09-14: qty 10

## 2017-09-14 MED ORDER — SODIUM CHLORIDE 0.9 % IV SOLN
Freq: Once | INTRAVENOUS | Status: AC
  Administered 2017-09-14: 15:00:00 via INTRAVENOUS
  Filled 2017-09-14: qty 1000

## 2017-09-14 NOTE — Telephone Encounter (Signed)
Patient came in today for her Venofer Infusion and was told at Registration that she did not have an appt and the patient presented her AVS/Schd that she was given at her last appt, which reflected 09/14/17 appt @ 1:30 p.m. Patient started a new job a few weeks and had taken time off from work for this appt. Addon Venofer for today, (ok per Kim/Chemo) per Scheduling Error/Colette. Patient AVS has 09/14/17 & 09/21/17 appt dates.

## 2017-09-14 NOTE — Progress Notes (Signed)
  Oncology Nurse Navigator Documentation  Navigator Location: [facility] (09/14/17 1000)   )Navigator Encounter Type: Telephone (09/14/17 1000) Telephone: Lahoma Crocker Call (09/14/17 1000)                                                  Time Spent with Patient: 15 (09/14/17 1000)   Per Jerene Pitch our scheduler, the patient's CT scan scheduled for today was cancelled due to Medicaid denial.  I have left the patient a message informing her of the cancellation.

## 2017-09-15 ENCOUNTER — Inpatient Hospital Stay: Payer: Medicaid Other

## 2017-09-21 ENCOUNTER — Encounter: Payer: Self-pay | Admitting: Oncology

## 2017-09-21 ENCOUNTER — Inpatient Hospital Stay: Payer: Medicaid Other

## 2017-09-21 ENCOUNTER — Other Ambulatory Visit: Payer: Self-pay | Admitting: Oncology

## 2017-09-21 VITALS — BP 139/85 | HR 69 | Temp 97.7°F | Resp 18

## 2017-09-21 DIAGNOSIS — C50812 Malignant neoplasm of overlapping sites of left female breast: Secondary | ICD-10-CM

## 2017-09-21 DIAGNOSIS — Z171 Estrogen receptor negative status [ER-]: Principal | ICD-10-CM

## 2017-09-21 MED ORDER — IRON SUCROSE 20 MG/ML IV SOLN
200.0000 mg | Freq: Once | INTRAVENOUS | Status: AC
  Administered 2017-09-21: 200 mg via INTRAVENOUS
  Filled 2017-09-21: qty 10

## 2017-09-21 MED ORDER — SODIUM CHLORIDE 0.9 % IV SOLN
Freq: Once | INTRAVENOUS | Status: AC
  Administered 2017-09-21: 15:00:00 via INTRAVENOUS
  Filled 2017-09-21: qty 1000

## 2017-10-03 ENCOUNTER — Ambulatory Visit: Admission: RE | Admit: 2017-10-03 | Payer: Medicaid Other | Source: Ambulatory Visit

## 2017-10-05 ENCOUNTER — Ambulatory Visit: Payer: Self-pay | Admitting: General Surgery

## 2017-10-05 DIAGNOSIS — Z171 Estrogen receptor negative status [ER-]: Principal | ICD-10-CM

## 2017-10-05 DIAGNOSIS — C50212 Malignant neoplasm of upper-inner quadrant of left female breast: Secondary | ICD-10-CM

## 2017-10-06 ENCOUNTER — Ambulatory Visit
Admission: RE | Admit: 2017-10-06 | Discharge: 2017-10-06 | Disposition: A | Discharge status: Home / Self Care | Payer: Medicaid Other | Source: Ambulatory Visit | Attending: Oncology | Admitting: Oncology

## 2017-10-06 DIAGNOSIS — J439 Emphysema, unspecified: Secondary | ICD-10-CM | POA: Insufficient documentation

## 2017-10-06 DIAGNOSIS — E041 Nontoxic single thyroid nodule: Secondary | ICD-10-CM | POA: Diagnosis not present

## 2017-10-06 DIAGNOSIS — N83202 Unspecified ovarian cyst, left side: Secondary | ICD-10-CM | POA: Diagnosis not present

## 2017-10-06 DIAGNOSIS — C50812 Malignant neoplasm of overlapping sites of left female breast: Secondary | ICD-10-CM | POA: Diagnosis not present

## 2017-10-06 DIAGNOSIS — D259 Leiomyoma of uterus, unspecified: Secondary | ICD-10-CM | POA: Insufficient documentation

## 2017-10-06 DIAGNOSIS — Z171 Estrogen receptor negative status [ER-]: Secondary | ICD-10-CM | POA: Diagnosis not present

## 2017-10-06 MED ORDER — IOHEXOL 300 MG/ML  SOLN
100.0000 mL | Freq: Once | INTRAMUSCULAR | Status: AC | PRN
  Administered 2017-10-06: 100 mL via INTRAVENOUS

## 2017-10-16 ENCOUNTER — Encounter: Payer: Self-pay | Admitting: *Deleted

## 2017-10-17 ENCOUNTER — Inpatient Hospital Stay: Admission: RE | Admit: 2017-10-17 | Payer: Self-pay | Source: Ambulatory Visit

## 2017-10-17 NOTE — Progress Notes (Signed)
  Oncology Nurse Navigator Documentation  Navigator Location: [facility] (10/17/17 0900)   )Navigator Encounter Type: Telephone (10/17/17 0900) Telephone: Incoming Call (10/17/17 0900)                                                  Time Spent with Patient: 30 (10/17/17 0900)   Patient called today to update me on what is going on with her.  States she is having surgery on bilateral eyes on Tuesday.  States she has 2 torn retina's, and is scheduled for surgery at [facility].  States she has lost her job, and her mom in [place] is not doing.  States she is planning to visit her mom as soon as possible.  States while she is in [place] she may try and get a second opinion.  She is scheduled for surgery on the 25th and at this time is still planning to have her lumpectomy at that time.  I informed her I would schedule her to return to see Dr. Tasia Catchings about a 1-2 weeks after her surgery.

## 2017-10-18 ENCOUNTER — Encounter: Payer: Self-pay | Admitting: *Deleted

## 2017-10-18 NOTE — Progress Notes (Signed)
  Oncology Nurse Navigator Documentation  Navigator Location: [facility] (10/18/17 0900)   )Navigator Encounter Type: Telephone (10/18/17 0900) Telephone: Lahoma Crocker Call (10/18/17 0900)                                                  Time Spent with Patient: 15 (10/18/17 0900)   Called patient to check on her post eye surgery yesterday.  States she is having right eye pain, but the doctor said she was doing well.  Informed of her follow-up appointment with Dr. Tasia Catchings o 11/10/17 @ 2:45.

## 2017-10-24 ENCOUNTER — Other Ambulatory Visit: Payer: Self-pay

## 2017-10-24 ENCOUNTER — Encounter
Admission: RE | Admit: 2017-10-24 | Discharge: 2017-10-24 | Disposition: A | Discharge status: Home / Self Care | Payer: Medicaid Other | Source: Ambulatory Visit | Attending: General Surgery | Admitting: General Surgery

## 2017-10-24 HISTORY — DX: Anemia, unspecified: D64.9

## 2017-10-24 HISTORY — DX: Depression, unspecified: F32.A

## 2017-10-24 HISTORY — DX: Malignant (primary) neoplasm, unspecified: C80.1

## 2017-10-24 HISTORY — DX: Major depressive disorder, single episode, unspecified: F32.9

## 2017-10-24 HISTORY — DX: Anxiety disorder, unspecified: F41.9

## 2017-10-24 NOTE — Patient Instructions (Signed)
Your procedure is scheduled on: 10/26/17 0800 Report to Radiology. NUCLEAR MED .  Remember: Instructions that are not followed completely may result in serious medical risk, up to and including death, or upon the discretion of your surgeon and anesthesiologist your surgery may need to be rescheduled.     _X__ 1. Do not eat food after midnight the night before your procedure.                 No gum chewing or hard candies. You may drink clear liquids up to 2 hours                 before you are scheduled to arrive for your surgery- DO not drink clear                 liquids within 2 hours of the start of your surgery.                 Clear Liquids include:  water, apple juice without pulp, clear carbohydrate                 drink such as Clearfast of Gartorade, Black Coffee or Tea (Do not add                 anything to coffee or tea).  __X__2.  On the morning of surgery brush your teeth with toothpaste and water, you                 may rinse your mouth with mouthwash if you wish.  Do not swallow any              toothpaste of mouthwash.     _X__ 3.  No Alcohol for 24 hours before or after surgery.   _X__ 4.  Do Not Smoke or use e-cigarettes For 24 Hours Prior to Your Surgery.                 Do not use any chewable tobacco products for at least 6 hours prior to                 surgery.  ____  5.  Bring all medications with you on the day of surgery if instructed.   X___  6.  Notify your doctor if there is any change in your medical condition      (cold, fever, infections).     Do not wear jewelry, make-up, hairpins, clips or nail polish. Do not wear lotions, powders, or perfumes. You may wear deodorant. Do not shave 48 hours prior to surgery. Men may shave face and neck. Do not bring valuables to the hospital.    [facility] is not responsible for any belongings or valuables.  Contacts, dentures or bridgework may not be worn into surgery. Leave your suitcase  in the car. After surgery it may be brought to your room. For patients admitted to the hospital, discharge time is determined by your treatment team.   Patients discharged the day of surgery will not be allowed to drive home.   X____ Take these medicines the morning of surgery with A SIP OF WATER:    1. ALPRAZOLAM  2. CIMETADINE  3.   4.  5.  6.  ____ Fleet Enema (as directed)   ____ Use CHG Soap as directed  ____ Use inhalers on the day of surgery  ____ Stop metformin 2 days prior to surgery    ____ Take 1/2 of  usual insulin dose the night before surgery. No insulin the morning          of surgery.   ____ Stop Coumadin/Plavix/aspirin on  ____ Stop Anti-inflammatories on   ____ Stop supplements until after surgery.    ____ Bring C-Pap to the hospital.

## 2017-10-24 NOTE — Pre-Procedure Instructions (Signed)
TELEPHONE INTERVIEW WITH PATIENT. STATES SHE MISSED PREOP ON 4/16/219 DUE TO RETINAL DETACHMENT SURGERY. STATES DR ERIC POSTEL WANTS TO BE NOTIFIED OF SURGERY PLANNED FOR 10/26/17 TO APPROVE SINCE RECENT EYE SURGERY. SPOKE WITH SYLVIA TRIAGE NURSE AT DR TOTH'S OFFICE. THEY WILL NOTIFY PATIENT AS SOON AS DECISION MADE TO PROCEED OR NOT 10/26/17

## 2017-10-25 NOTE — Pre-Procedure Instructions (Signed)
SPOKE WITH SYLVIA AT DR TOTH'S OFFICE RE DR POSTEL'S DECISION FOR PATIENT TO PROCEED WITH SURGERY 10/26/17. ALSO CALLED DR POSTEL'S OFFICE AND HAD TO LM ON OFFICE LINE REQUESTING INFO BE CALLED OR FAXED. UNABLE TO FIND OFFICE NOTE IN Epic FOR TODAY

## 2017-11-07 ENCOUNTER — Encounter: Payer: Self-pay | Admitting: *Deleted

## 2017-11-07 NOTE — Progress Notes (Signed)
  Oncology Nurse Navigator Documentation  Navigator Location: [facility] (11/07/17 1600)   )Navigator Encounter Type: Telephone (11/07/17 1600) Telephone: Incoming Call (11/07/17 1600)                     Treatment Phase: Pre-Tx/Tx Discussion (11/07/17 1600) Barriers/Navigation Needs: Financial;Coordination of Care (11/07/17 1600)   Interventions: Referrals (11/07/17 1600) Referrals: Social Work (11/07/17 1600)                    Time Spent with Patient: 45 (11/07/17 1600)   Patient called today and needed assistance with paperwork for getting food stamps.  I emailed her [facility] Medicaid case worker who gave me the name and number of a food stamp case worker for the patient.  Patient states she is not able to work right now, because she cannot see well since her eye surgery.  Confirmed her surgery date for May 24th, and her follow up with Dr. Tasia Catchings on 12/08/17..  States she thinks the breast mass is getting larger.  Encouraged the importance of keeping her surgical appointment, and explained that the cancer would continue to grow until she has surgery.  She is agreeable to keep her surgery appointment.  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  She is to call if she has any other questions or needs.

## 2017-11-10 ENCOUNTER — Ambulatory Visit: Payer: Self-pay | Admitting: Oncology

## 2017-11-23 MED ORDER — CEFAZOLIN SODIUM-DEXTROSE 2-4 GM/100ML-% IV SOLN
2.0000 g | INTRAVENOUS | Status: AC
  Administered 2017-11-24: 2 g via INTRAVENOUS

## 2017-11-24 ENCOUNTER — Ambulatory Visit
Admission: RE | Admit: 2017-11-24 | Discharge: 2017-11-24 | Disposition: A | Discharge status: Home / Self Care | Payer: Medicaid Other | Source: Ambulatory Visit | Attending: General Surgery | Admitting: General Surgery

## 2017-11-24 ENCOUNTER — Encounter
Admission: RE | Disposition: A | Discharge status: Home / Self Care | Payer: Self-pay | Source: Ambulatory Visit | Attending: General Surgery

## 2017-11-24 ENCOUNTER — Ambulatory Visit: Payer: Medicaid Other | Admitting: Certified Registered Nurse Anesthetist

## 2017-11-24 ENCOUNTER — Encounter: Payer: Self-pay | Admitting: *Deleted

## 2017-11-24 ENCOUNTER — Encounter
Admission: RE | Admit: 2017-11-24 | Discharge: 2017-11-24 | Disposition: A | Discharge status: Home / Self Care | Payer: Medicaid Other | Source: Ambulatory Visit | Attending: General Surgery | Admitting: General Surgery

## 2017-11-24 ENCOUNTER — Other Ambulatory Visit: Payer: Self-pay

## 2017-11-24 DIAGNOSIS — Z171 Estrogen receptor negative status [ER-]: Secondary | ICD-10-CM | POA: Insufficient documentation

## 2017-11-24 DIAGNOSIS — F329 Major depressive disorder, single episode, unspecified: Secondary | ICD-10-CM | POA: Insufficient documentation

## 2017-11-24 DIAGNOSIS — I10 Essential (primary) hypertension: Secondary | ICD-10-CM | POA: Insufficient documentation

## 2017-11-24 DIAGNOSIS — F419 Anxiety disorder, unspecified: Secondary | ICD-10-CM | POA: Diagnosis not present

## 2017-11-24 DIAGNOSIS — Z791 Long term (current) use of non-steroidal anti-inflammatories (NSAID): Secondary | ICD-10-CM | POA: Insufficient documentation

## 2017-11-24 DIAGNOSIS — Z79899 Other long term (current) drug therapy: Secondary | ICD-10-CM | POA: Insufficient documentation

## 2017-11-24 DIAGNOSIS — F17200 Nicotine dependence, unspecified, uncomplicated: Secondary | ICD-10-CM | POA: Diagnosis not present

## 2017-11-24 DIAGNOSIS — D630 Anemia in neoplastic disease: Secondary | ICD-10-CM | POA: Insufficient documentation

## 2017-11-24 DIAGNOSIS — C50212 Malignant neoplasm of upper-inner quadrant of left female breast: Secondary | ICD-10-CM | POA: Diagnosis not present

## 2017-11-24 HISTORY — PX: BREAST LUMPECTOMY WITH AXILLARY LYMPH NODE BIOPSY: SHX5593

## 2017-11-24 LAB — CBC
HCT: 23.2 % — ABNORMAL LOW (ref 35.0–47.0)
Hemoglobin: 7.6 g/dL — ABNORMAL LOW (ref 12.0–16.0)
MCH: 27.4 pg (ref 26.0–34.0)
MCHC: 32.8 g/dL (ref 32.0–36.0)
MCV: 83.7 fL (ref 80.0–100.0)
PLATELETS: 334 10*3/uL (ref 150–440)
RBC: 2.77 MIL/uL — ABNORMAL LOW (ref 3.80–5.20)
RDW: 20.3 % — ABNORMAL HIGH (ref 11.5–14.5)
WBC: 7 10*3/uL (ref 3.6–11.0)

## 2017-11-24 SURGERY — BREAST LUMPECTOMY WITH AXILLARY LYMPH NODE BIOPSY
Anesthesia: General | Site: Breast | Laterality: Left

## 2017-11-24 MED ORDER — BUPIVACAINE-EPINEPHRINE (PF) 0.25% -1:200000 IJ SOLN
INTRAMUSCULAR | Status: AC
  Filled 2017-11-24: qty 30

## 2017-11-24 MED ORDER — DEXAMETHASONE SODIUM PHOSPHATE 10 MG/ML IJ SOLN
INTRAMUSCULAR | Status: DC | PRN
  Administered 2017-11-24: 8 mg via INTRAVENOUS

## 2017-11-24 MED ORDER — CHLORHEXIDINE GLUCONATE CLOTH 2 % EX PADS: 6.0000 | MEDICATED_PAD | Freq: Once | CUTANEOUS | Status: DC

## 2017-11-24 MED ORDER — LIDOCAINE HCL (PF) 2 % IJ SOLN
INTRAMUSCULAR | Status: AC
  Filled 2017-11-24: qty 20

## 2017-11-24 MED ORDER — ACETAMINOPHEN 10 MG/ML IV SOLN
INTRAVENOUS | Status: DC | PRN
  Administered 2017-11-24: 1000 mg via INTRAVENOUS

## 2017-11-24 MED ORDER — MIDAZOLAM HCL 2 MG/2ML IJ SOLN
INTRAMUSCULAR | Status: DC | PRN
  Administered 2017-11-24: 2 mg via INTRAVENOUS

## 2017-11-24 MED ORDER — SUCCINYLCHOLINE CHLORIDE 20 MG/ML IJ SOLN
INTRAMUSCULAR | Status: AC
  Filled 2017-11-24: qty 1

## 2017-11-24 MED ORDER — ROCURONIUM BROMIDE 100 MG/10ML IV SOLN
INTRAVENOUS | Status: DC | PRN
  Administered 2017-11-24: 40 mg via INTRAVENOUS
  Administered 2017-11-24 (×2): 10 mg via INTRAVENOUS

## 2017-11-24 MED ORDER — FENTANYL CITRATE (PF) 100 MCG/2ML IJ SOLN
INTRAMUSCULAR | Status: AC
  Filled 2017-11-24: qty 2

## 2017-11-24 MED ORDER — HYDROCODONE-ACETAMINOPHEN 5-325 MG PO TABS: 1.0000 | ORAL_TABLET | Freq: Four times a day (QID) | ORAL | 0 refills | Status: DC | PRN

## 2017-11-24 MED ORDER — PHENYLEPHRINE HCL 10 MG/ML IJ SOLN
INTRAMUSCULAR | Status: DC | PRN
  Administered 2017-11-24: 100 ug via INTRAVENOUS

## 2017-11-24 MED ORDER — MIDAZOLAM HCL 2 MG/2ML IJ SOLN
INTRAMUSCULAR | Status: AC
  Filled 2017-11-24: qty 2

## 2017-11-24 MED ORDER — FENTANYL CITRATE (PF) 100 MCG/2ML IJ SOLN
25.0000 ug | INTRAMUSCULAR | Status: DC | PRN
  Administered 2017-11-24 (×4): 25 ug via INTRAVENOUS

## 2017-11-24 MED ORDER — LACTATED RINGERS IV SOLN
INTRAVENOUS | Status: DC
  Administered 2017-11-24: 13:00:00 via INTRAVENOUS

## 2017-11-24 MED ORDER — METOPROLOL TARTRATE 5 MG/5ML IV SOLN
INTRAVENOUS | Status: DC | PRN
  Administered 2017-11-24: 5 mg via INTRAVENOUS

## 2017-11-24 MED ORDER — TECHNETIUM TC 99M SULFUR COLLOID FILTERED
0.9180 | Freq: Once | INTRAVENOUS | Status: AC | PRN
  Administered 2017-11-24: 0.918 via INTRADERMAL

## 2017-11-24 MED ORDER — GLYCOPYRROLATE 0.2 MG/ML IJ SOLN
INTRAMUSCULAR | Status: AC
  Filled 2017-11-24: qty 2

## 2017-11-24 MED ORDER — ROCURONIUM BROMIDE 50 MG/5ML IV SOLN
INTRAVENOUS | Status: AC
  Filled 2017-11-24: qty 1

## 2017-11-24 MED ORDER — FENTANYL CITRATE (PF) 100 MCG/2ML IJ SOLN
INTRAMUSCULAR | Status: AC
  Administered 2017-11-24: 25 ug via INTRAVENOUS
  Filled 2017-11-24: qty 2

## 2017-11-24 MED ORDER — LIDOCAINE HCL (PF) 2 % IJ SOLN
INTRAMUSCULAR | Status: AC
  Filled 2017-11-24: qty 10

## 2017-11-24 MED ORDER — ONDANSETRON HCL 4 MG/2ML IJ SOLN
INTRAMUSCULAR | Status: DC | PRN
  Administered 2017-11-24: 4 mg via INTRAVENOUS

## 2017-11-24 MED ORDER — SUGAMMADEX SODIUM 500 MG/5ML IV SOLN
INTRAVENOUS | Status: DC | PRN
  Administered 2017-11-24: 141.6 mg via INTRAVENOUS

## 2017-11-24 MED ORDER — BUPIVACAINE-EPINEPHRINE (PF) 0.25% -1:200000 IJ SOLN
INTRAMUSCULAR | Status: DC | PRN
  Administered 2017-11-24: 7 mL via PERINEURAL

## 2017-11-24 MED ORDER — ACETAMINOPHEN 10 MG/ML IV SOLN
INTRAVENOUS | Status: AC
  Filled 2017-11-24: qty 100

## 2017-11-24 MED ORDER — ONDANSETRON HCL 4 MG/2ML IJ SOLN: 4.0000 mg | Freq: Once | INTRAMUSCULAR | Status: DC | PRN

## 2017-11-24 MED ORDER — PROPOFOL 10 MG/ML IV BOLUS
INTRAVENOUS | Status: AC
  Filled 2017-11-24: qty 20

## 2017-11-24 MED ORDER — SEVOFLURANE IN SOLN
RESPIRATORY_TRACT | Status: AC
  Filled 2017-11-24: qty 250

## 2017-11-24 MED ORDER — LIDOCAINE HCL (CARDIAC) PF 100 MG/5ML IV SOSY
PREFILLED_SYRINGE | INTRAVENOUS | Status: DC | PRN
  Administered 2017-11-24: 60 mg via INTRAVENOUS

## 2017-11-24 MED ORDER — GABAPENTIN 300 MG PO CAPS: 300.0000 mg | ORAL_CAPSULE | ORAL | Status: DC

## 2017-11-24 MED ORDER — FENTANYL CITRATE (PF) 100 MCG/2ML IJ SOLN
INTRAMUSCULAR | Status: DC | PRN
  Administered 2017-11-24: 25 ug via INTRAVENOUS
  Administered 2017-11-24 (×2): 50 ug via INTRAVENOUS
  Administered 2017-11-24: 25 ug via INTRAVENOUS
  Administered 2017-11-24: 50 ug via INTRAVENOUS

## 2017-11-24 MED ORDER — ROCURONIUM BROMIDE 50 MG/5ML IV SOLN
INTRAVENOUS | Status: AC
  Filled 2017-11-24: qty 2

## 2017-11-24 MED ORDER — PROPOFOL 10 MG/ML IV BOLUS
INTRAVENOUS | Status: DC | PRN
  Administered 2017-11-24: 170 mg via INTRAVENOUS

## 2017-11-24 MED ORDER — TRAMADOL HCL 50 MG PO TABS: 50.0000 mg | ORAL_TABLET | Freq: Four times a day (QID) | ORAL | 1 refills | Status: DC | PRN

## 2017-11-24 MED ORDER — PROPOFOL 10 MG/ML IV BOLUS
INTRAVENOUS | Status: AC
  Filled 2017-11-24: qty 40

## 2017-11-24 SURGICAL SUPPLY — 47 items
APPLIER CLIP 11 MED OPEN (CLIP) ×6
BINDER BREAST LRG (GAUZE/BANDAGES/DRESSINGS) ×3 IMPLANT
BINDER BREAST MEDIUM (GAUZE/BANDAGES/DRESSINGS) IMPLANT
BINDER BREAST XLRG (GAUZE/BANDAGES/DRESSINGS) IMPLANT
BLADE SURG 15 STRL LF DISP TIS (BLADE) ×2 IMPLANT
BLADE SURG 15 STRL SS (BLADE) ×4
BULB RESERV EVAC DRAIN JP 100C (MISCELLANEOUS) IMPLANT
CLIP APPLIE 11 MED OPEN (CLIP) ×2 IMPLANT
CLOSURE WOUND 1/2 X4 (GAUZE/BANDAGES/DRESSINGS) ×1
CNTNR SPEC 2.5X3XGRAD LEK (MISCELLANEOUS) ×3
CONT SPEC 4OZ STER OR WHT (MISCELLANEOUS) ×6
CONTAINER SPEC 2.5X3XGRAD LEK (MISCELLANEOUS) ×3 IMPLANT
DERMABOND ADVANCED (GAUZE/BANDAGES/DRESSINGS) ×2
DERMABOND ADVANCED .7 DNX12 (GAUZE/BANDAGES/DRESSINGS) ×1 IMPLANT
DRAPE LAPAROSCOPIC ABDOMINAL (DRAPES) ×3 IMPLANT
DRAPE SHEET LG 3/4 BI-LAMINATE (DRAPES) ×3 IMPLANT
DRAPE UTILITY 15X26 TOWEL STRL (DRAPES) ×6 IMPLANT
ELECT CAUTERY BLADE 6.4 (BLADE) ×3 IMPLANT
ELECT REM PT RETURN 9FT ADLT (ELECTROSURGICAL) ×3
ELECTRODE REM PT RTRN 9FT ADLT (ELECTROSURGICAL) ×1 IMPLANT
GAUZE SPONGE 4X4 12PLY STRL (GAUZE/BANDAGES/DRESSINGS) ×6 IMPLANT
GLOVE BIO SURGEON STRL SZ7.5 (GLOVE) ×6 IMPLANT
GLOVE BIOGEL PI IND STRL 7.0 (GLOVE) ×1 IMPLANT
GLOVE BIOGEL PI INDICATOR 7.0 (GLOVE) ×2
GOWN STRL REUS W/ TWL XL LVL3 (GOWN DISPOSABLE) ×1 IMPLANT
GOWN STRL REUS W/TWL LRG LVL3 (GOWN DISPOSABLE) ×3 IMPLANT
GOWN STRL REUS W/TWL XL LVL3 (GOWN DISPOSABLE) ×5 IMPLANT
JACKSON PRATT 10 (INSTRUMENTS) IMPLANT
MARKER SKIN DUAL TIP RULER LAB (MISCELLANEOUS) ×3 IMPLANT
NEEDLE HYPO 25GX1X1/2 BEV (NEEDLE) ×3 IMPLANT
NS IRRIG 1000ML POUR BTL (IV SOLUTION) ×3 IMPLANT
PACK BASIN MINOR ARMC (MISCELLANEOUS) ×3 IMPLANT
PAD ABD DERMACEA PRESS 5X9 (GAUZE/BANDAGES/DRESSINGS) ×6 IMPLANT
SPONGE LAP 18X18 RF (DISPOSABLE) ×6 IMPLANT
STAPLER SKIN PROX 35W (STAPLE) ×6 IMPLANT
STRIP CLOSURE SKIN 1/2X4 (GAUZE/BANDAGES/DRESSINGS) ×2 IMPLANT
SUT ETHILON 3 0 PS 1 (SUTURE) ×6 IMPLANT
SUT MNCRL AB 4-0 PS2 18 (SUTURE) ×6 IMPLANT
SUT SILK 0 SH 30 (SUTURE) ×3 IMPLANT
SUT SILK 3 0 (SUTURE) ×2
SUT SILK 3-0 18XBRD TIE 12 (SUTURE) ×1 IMPLANT
SUT VIC AB 3-0 SH 8-18 (SUTURE) ×3 IMPLANT
SUT VICRYL 3-0 CR8 SH (SUTURE) ×3 IMPLANT
SWABSTK COMLB BENZOIN TINCTURE (MISCELLANEOUS) ×3 IMPLANT
SYR 10ML LL (SYRINGE) ×3 IMPLANT
TOWEL OR 17X26 4PK STRL BLUE (TOWEL DISPOSABLE) IMPLANT
WATER STERILE IRR 1000ML POUR (IV SOLUTION) ×3 IMPLANT

## 2017-11-24 NOTE — H&P (Signed)
Cindy Brewer  Location: [place] Office Patient #: [id number] DOB: 1970-11-17 Divorced / Language: English / Race: Black or African American Female   History of Present Illness The patient is a 47 year old female who presents for a follow-up for Breast cancer. the patient was seen recently and was found to have a large approximately 5 cm cancer in the upper portion of the left breast that was triple negative with a Ki-67 of 80%. I initially sent her to the cancer center for consideration of neoadjuvant chemotherapy given the size of the cancer and the triple negative nature. She has so far refused chemotherapy. She returns today and is willing to undergo a lumpectomy and sentinel node mapping. 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 She will be ready for breast surgery the week after next.   Problem List/Past [facility] MALIGNANT NEOPLASM OF UPPER-INNER QUADRANT OF LEFT BREAST IN FEMALE, ESTROGEN RECEPTOR NEGATIVE (H47.425)   Past Surgical History Breast Biopsy  Left. Cesarean Section - 1   Diagnostic Studies History  Colonoscopy  never Mammogram  within last year Pap Smear  1-5 years ago  Allergies  Percocet *ANALGESICS - OPIOID*  Anaphylaxis. Sulfa 10 *OPHTHALMIC AGENTS*  Anaphylaxis. Demerol *ANALGESICS - OPIOID*  Anaphylaxis.  Medication History  Advil ('200MG'$  Capsule, Oral prn) Active. ALPRAZolam (0.'25MG'$  Tablet, Oral as needed) Active. Medications Reconciled  Social History  Alcohol use  Moderate alcohol use. Caffeine use  Carbonated beverages, Coffee, Tea. No drug use  Tobacco use  Current every day smoker.  Family History  Alcohol Abuse  Daughter, Family Members In General, Father, Mother. Seizure disorder  Mother.  Pregnancy / Birth History Age at menarche  34 years. Contraceptive History  Oral contraceptives. Gravida  7 Maternal age  82-20 Para  6 Regular  periods   Other Problems  Anxiety Disorder  Back Pain  Chest pain  Depression  Gastroesophageal Reflux Disease  High blood pressure     Review of Systems  General Present- Fatigue and Night Sweats. Not Present- Appetite Loss, Chills, Fever, Weight Gain and Weight Loss. HEENT Present- Hoarseness, Nose Bleed, Seasonal Allergies and Wears glasses/contact lenses. Not Present- Earache, Hearing Loss, Oral Ulcers, Ringing in the Ears, Sinus Pain, Sore Throat, Visual Disturbances and Yellow Eyes. Respiratory Present- Chronic Cough, Snoring and Wheezing. Not Present- Bloody sputum and Difficulty Breathing. Breast Present- Breast Mass. Not Present- Breast Pain, Nipple Discharge and Skin Changes. Cardiovascular Present- Palpitations and Rapid Heart Rate. Not Present- Chest Pain, Difficulty Breathing Lying Down, Leg Cramps, Shortness of Breath and Swelling of Extremities. Gastrointestinal Present- Difficulty Swallowing, Gets full quickly at meals, Indigestion and Nausea. Not Present- Abdominal Pain, Bloating, Bloody Stool, Change in Bowel Habits, Chronic diarrhea, Constipation, Excessive gas, Hemorrhoids, Rectal Pain and Vomiting. Female Genitourinary Present- Pelvic Pain. Not Present- Frequency, Nocturia, Painful Urination and Urgency. Musculoskeletal Present- Back Pain and Joint Stiffness. Not Present- Joint Pain, Muscle Pain, Muscle Weakness and Swelling of Extremities. Psychiatric Present- Anxiety, Change in Sleep Pattern, Depression, Fearful and Frequent crying. Not Present- Bipolar. Endocrine Present- Cold Intolerance. Not Present- Excessive Hunger, Hair Changes, Heat Intolerance, Hot flashes and New Diabetes.  Vitals Height: 66in       Physical Exam  General Mental Status-Alert. General Appearance-Consistent with stated age. Hydration-Well hydrated. Voice-Normal.  Head and Neck Head-normocephalic, atraumatic with no lesions or palpable  masses. Trachea-midline. Thyroid Gland Characteristics - normal size and consistency.  Eye Eyeball - Bilateral-Extraocular movements intact. Sclera/Conjunctiva -  Bilateral-No scleral icterus.  Chest and Lung Exam Chest and lung exam reveals -quiet, even and easy respiratory effort with no use of accessory muscles and on auscultation, normal breath sounds, no adventitious sounds and normal vocal resonance. Inspection Chest Wall - Normal. Back - normal.  Breast Note: there is a 4-5 cm palpable mass in the upper portion of the left breast. The mass appears to be mobile and not fixed to the chest wall. the right breast tissue is fairly dense and nodular with no specific palpable mass. There is no palpable axillary, supraclavicular, or cervical lymphadenopathy.   Cardiovascular Cardiovascular examination reveals -normal heart sounds, regular rate and rhythm with no murmurs and normal pedal pulses bilaterally.  Abdomen Inspection Inspection of the abdomen reveals - No Hernias. Skin - Scar - no surgical scars. Palpation/Percussion Palpation and Percussion of the abdomen reveal - Soft, Non Tender, No Rebound tenderness, No Rigidity (guarding) and No hepatosplenomegaly. Auscultation Auscultation of the abdomen reveals - Bowel sounds normal.  Neurologic Neurologic evaluation reveals -alert and oriented x 3 with no impairment of recent or remote memory. Mental Status-Normal.  Musculoskeletal Normal Exam - Left-Upper Extremity Strength Normal and Lower Extremity Strength Normal. Normal Exam - Right-Upper Extremity Strength Normal and Lower Extremity Strength Normal.  Lymphatic Head & Neck  General Head & Neck Lymphatics: Bilateral - Description - Normal. Axillary  General Axillary Region: Bilateral - Description - Normal. Tenderness - Non Tender. Femoral & Inguinal  Generalized Femoral & Inguinal Lymphatics: Bilateral - Description - Normal. Tenderness - Non  Tender.    Assessment & Plan  MALIGNANT NEOPLASM OF UPPER-INNER QUADRANT OF LEFT BREAST IN FEMALE, ESTROGEN RECEPTOR NEGATIVE (C50.212) Impression: the patient has a known large area of triple negative breast cancer in the upper portion of the left breast. She is still very resistant to undergoing chemotherapy. She has agreed to a lumpectomy and sentinel node mapping. I have discussed with her in detail the risks and benefits of the operation as well as some of the technical aspects and she understands and wishes to proceed. She also understands that if she comes back with positive margins that she will likely need a mastectomy. I will not place a port at this time since she has not indicated that she will go through with chemotherapy. After surgery we will have her follow up with medical and radiation oncology to further discuss adjuvant therapy.

## 2017-11-24 NOTE — Progress Notes (Signed)
pts ride here,pt oob to bathroom,driinking and eating with no pain

## 2017-11-24 NOTE — Progress Notes (Addendum)
Spoke with Dr Excell Seltzer who stated to not let pt have prescription meds, will tell to take advil and tylenol alternating, Shredded prescritons with Nevin Bloodgood RN

## 2017-11-24 NOTE — Anesthesia Preprocedure Evaluation (Addendum)
Anesthesia Evaluation  Patient identified by MRN, date of birth, ID band Patient awake    Reviewed: Allergy & Precautions, NPO status , Patient's Chart, lab work & pertinent test results  Airway Mallampati: II  TM Distance: >3 FB     Dental  (+) Teeth Intact   Pulmonary Current Smoker,    Pulmonary exam normal        Cardiovascular negative cardio ROS Normal cardiovascular exam     Neuro/Psych PSYCHIATRIC DISORDERS Anxiety Depression    GI/Hepatic Neg liver ROS, GERD  Poorly Controlled,  Endo/Other  negative endocrine ROS  Renal/GU negative Renal ROS  negative genitourinary   Musculoskeletal negative musculoskeletal ROS (+)   Abdominal Normal abdominal exam  (+)   Peds negative pediatric ROS (+)  Hematology  (+) anemia ,   Anesthesia Other Findings Past Medical History: No date: Anemia No date: Anxiety No date: Cancer ([place])     Comment:  BREAST No date: Depression No date: Fibroid No date: GERD (gastroesophageal reflux disease)  Reproductive/Obstetrics                            Anesthesia Physical Anesthesia Plan  ASA: II  Anesthesia Plan: General   Post-op Pain Management:    Induction: Intravenous, Rapid sequence and Cricoid pressure planned  PONV Risk Score and Plan:   Airway Management Planned: Oral ETT  Additional Equipment:   Intra-op Plan:   Post-operative Plan: Extubation in OR  Informed Consent: I have reviewed the patients History and Physical, chart, labs and discussed the procedure including the risks, benefits and alternatives for the proposed anesthesia with the patient or authorized representative who has indicated his/her understanding and acceptance.   Dental advisory given  Plan Discussed with: CRNA and Anesthesiologist  Anesthesia Plan Comments:        Anesthesia Quick Evaluation

## 2017-11-24 NOTE — Op Note (Signed)
11/24/2017  2:19 PM  PATIENT:  Cindy Brewer  47 y.o. female  PRE-OPERATIVE DIAGNOSIS:  LEFT BREAST CANCER  POST-OPERATIVE DIAGNOSIS:  LEFT BREAST CANCER  PROCEDURE:  Procedure(s): LEFT BREAST LUMPECTOMY WITH DEEP LEFT SENTINEL NODE LYMPH NODE BIOPSY (Left)  SURGEON:  Surgeon(s) and Role:    * Jovita Kussmaul, MD - Primary  PHYSICIAN ASSISTANT:   ASSISTANTS: none   ANESTHESIA:   general  EBL:  MINIMAL   BLOOD ADMINISTERED:none  DRAINS: none   LOCAL MEDICATIONS USED:  MARCAINE     SPECIMEN:  Source of Specimen:  LEFT BREAST TISSUE AND SENTINEL NODES  DISPOSITION OF SPECIMEN:  PATHOLOGY  COUNTS:  YES  TOURNIQUET:  * No tourniquets in log *  DICTATION: .Dragon Dictation   After informed consent was obtained the patient was brought to the operating room and placed in the supine position on the operating table.  After adequate induction of general anesthesia the patient's left chest, breast, and axillary area were prepped with ChloraPrep, allowed to dry, and draped in usual sterile manner.  An appropriate timeout was performed.  Earlier in the day the patient underwent injection of 1 mCi of technetium sulfur colloid in the subareolar position on the left. The node seeker was used to identify a hot spot in the left axilla. A small incision was made over this area. The incision was carried throught the skin and subcutaneous tissue sharply with the electrocautery until the deep left axillary space was entered. The node seeker was used to direct blunt hemostat dissection. I identified 3 lymph nodes with increased radioactivity. These were excised sharply with the electrocautery and the lymphatics were controlled with clips. These were sent to pathology for further evaluation.  No other hot or palpable lymph nodes were identified in the left axilla.  The area was examined and found to be hemostatic.  The deep layer of the wound was then closed with interrupted 3-0 Vicryl stitches.   The skin was then closed with a running 4-0 Monocryl subcuticular stitch.  Attention was then turned to the left breast.  The palpable mass in the upper portion of the left breast was much larger than it was previously on exam.  She previously refused mastectomy.  At this point an elliptical incision was made overlying the palpable mass with a 15 blade knife.  The incision was carried through the skin and subcutaneous tissue sharply with electrocautery.  This dissection was carried all the way to the chest wall.  Once the mass was removed it was oriented with a short stitch on the superior surface and a long stitch on the lateral surface and the skin was anterior.  This was sent to pathology for further evaluation.  Hemostasis was achieved using the Bovie electrocautery.  Very worried about margin lady given the size of the tumor.  The wound was then marked with clips.  The deep layer of the wound was closed with layers of interrupted 3-0 Vicryl stitches.  The skin was then closed with a running fascicular.  Dermabond dressings were applied.  The patient tolerated the procedure well.  At the end of the case all needle sponge and instrument counts were correct.  The patient was then awakened and taken to recovery in stable condition.  PLAN OF CARE: Discharge to home after PACU  PATIENT DISPOSITION:  PACU - hemodynamically stable.   Delay start of Pharmacological VTE agent (>24hrs) due to surgical blood loss or risk of bleeding: not applicable

## 2017-11-24 NOTE — Anesthesia Post-op Follow-up Note (Signed)
Anesthesia QCDR form completed.        

## 2017-11-24 NOTE — Transfer of Care (Signed)
Immediate Anesthesia Transfer of Care Note  Patient: Cindy Brewer  Procedure(s) Performed: LEFT BREAST LUMPECTOMY WITH LEFT SENTINEL NODE LYMPH NODE BIOPSY (Left Breast)  Patient Location: PACU  Anesthesia Type:General  Level of Consciousness: awake  Airway & Oxygen Therapy: Patient Spontanous Breathing and Patient connected to face mask oxygen  Post-op Assessment: Report given to RN and Post -op Vital signs reviewed and stable  Post vital signs: Reviewed and stable  Last Vitals:  Vitals Value Taken Time  BP 130/84 11/24/2017  2:40 PM  Temp 37.2 C 11/24/2017  2:40 PM  Pulse 73 11/24/2017  2:43 PM  Resp 18 11/24/2017  2:43 PM  SpO2 100 % 11/24/2017  2:43 PM  Vitals shown include unvalidated device data.  Last Pain:  Vitals:   11/24/17 1440  TempSrc:   PainSc: 5       Patients Stated Pain Goal: 2 (03/75/43 6067)  Complications: No apparent anesthesia complications

## 2017-11-24 NOTE — Progress Notes (Signed)
Pt stable, eating, not able to find a ride home

## 2017-11-24 NOTE — Discharge Instructions (Signed)
AMBULATORY SURGERY  DISCHARGE INSTRUCTIONS   1) The drugs that you were given will stay in your system until tomorrow so for the next 24 hours you should not:  A) Drive an automobile B) Make any legal decisions C) Drink any alcoholic beverage   2) You may resume regular meals tomorrow.  Today it is better to start with liquids and gradually work up to [employer] may eat anything you prefer, but it is better to start with liquids, then soup and crackers, and gradually work up to solid foods.   3) Please notify your doctor immediately if you have any unusual bleeding, trouble breathing, redness and pain at the surgery site, drainage, fever, or pain not relieved by medication. DO NOT TAKE Prescrition pain meds, , MAY ONLY TAKE Tylenol and Advil alternating as bottle states   4) Additional Instructions:        Please contact your physician with any problems or Same Day Surgery at [phone], Monday through Friday 6 am to 4 pm, or [place] at [facility] number at [phone].

## 2017-11-24 NOTE — Interval H&P Note (Signed)
History and Physical Interval Note:  11/24/2017 12:16 PM  Cindy Brewer  has presented today for surgery, with the diagnosis of LEFT BREAST CANCER  The various methods of treatment have been discussed with the patient and family. After consideration of risks, benefits and other options for treatment, the patient has consented to  Procedure(s): LEFT BREAST LUMPECTOMY WITH LEFT SENTINEL NODE LYMPH NODE BIOPSY (Left) as a surgical intervention .  The patient's history has been reviewed, patient examined, no change in status, stable for surgery.  I have reviewed the patient's chart and labs.  Questions were answered to the patient's satisfaction.     TOTH III,PAUL S

## 2017-11-24 NOTE — Anesthesia Procedure Notes (Addendum)
Procedure Name: Intubation Date/Time: 11/24/2017 1:00 PM Performed by: Allean Found, CRNA Pre-anesthesia Checklist: Patient identified, Emergency Drugs available, Suction available, Patient being monitored and Timeout performed Patient Re-evaluated:Patient Re-evaluated prior to induction Oxygen Delivery Method: Circle system utilized Preoxygenation: Pre-oxygenation with 100% oxygen Induction Type: IV induction Ventilation: Mask ventilation without difficulty Laryngoscope Size: Mac and 3 Grade View: Grade II Tube type: Oral Tube size: 7.0 mm Number of attempts: 2 Airway Equipment and Method: Bougie stylet and Stylet Placement Confirmation: ETT inserted through vocal cords under direct vision,  positive ETCO2 and breath sounds checked- equal and bilateral Secured at: 21 cm Tube secured with: Tape Dental Injury: Teeth and Oropharynx as per pre-operative assessment

## 2017-11-24 NOTE — Progress Notes (Signed)
Gauze dressing with breast binder

## 2017-11-25 NOTE — Anesthesia Postprocedure Evaluation (Signed)
Anesthesia Post Note  Patient: Therapist, music  Procedure(s) Performed: LEFT BREAST LUMPECTOMY WITH LEFT SENTINEL NODE LYMPH NODE BIOPSY (Left Breast)  Patient location during evaluation: PACU Anesthesia Type: General Level of consciousness: awake and alert and oriented Pain management: pain level controlled Vital Signs Assessment: post-procedure vital signs reviewed and stable Respiratory status: spontaneous breathing Cardiovascular status: blood pressure returned to baseline Anesthetic complications: no     Last Vitals:  Vitals:   11/24/17 1523 11/24/17 1531  BP: 112/76 119/81  Pulse: 84 77  Resp: 17 18  Temp: 36.9 C 36.9 C  SpO2: 100% 100%    Last Pain:  Vitals:   11/24/17 1531  TempSrc: Temporal  PainSc: 0-No pain                 Inis Borneman

## 2017-11-26 ENCOUNTER — Encounter: Payer: Self-pay | Admitting: General Surgery

## 2017-11-30 ENCOUNTER — Other Ambulatory Visit: Payer: Self-pay

## 2017-12-01 LAB — SURGICAL PATHOLOGY

## 2017-12-07 ENCOUNTER — Encounter: Payer: Self-pay | Admitting: *Deleted

## 2017-12-07 NOTE — Progress Notes (Signed)
  Oncology Nurse Navigator Documentation  Navigator Location: [facility] (12/07/17 1500)   )Navigator Encounter Type: Telephone (12/07/17 1500) Telephone: Lahoma Crocker Call (12/07/17 1500)                       Barriers/Navigation Needs: Coordination of Care (12/07/17 1500)                          Time Spent with Patient: 15 (12/07/17 1500)   Patient left me a message that she was trying to change her appointment that she has tomorrow with Dr. Marlou Starks at 2:45 to a different day.  Lillette Boxer at Dr. Ethlyn Gallery office an inbasket message to reschedule the patient.  Spoke to Cameron, one of our schedulers who said patient had left a message here to reschedule her 2:45 appointment tomorrow with Dr. Tasia Catchings.  I have left the patient a message with an appointment for tomorrow at 10:45.

## 2017-12-08 ENCOUNTER — Inpatient Hospital Stay: Payer: Medicaid Other | Attending: Oncology | Admitting: Oncology

## 2017-12-08 ENCOUNTER — Other Ambulatory Visit: Payer: Self-pay

## 2017-12-08 ENCOUNTER — Inpatient Hospital Stay: Payer: Medicaid Other

## 2017-12-08 ENCOUNTER — Encounter: Payer: Self-pay | Admitting: Oncology

## 2017-12-08 VITALS — BP 119/75 | HR 102 | Temp 97.9°F | Resp 16 | Wt 153.0 lb

## 2017-12-08 DIAGNOSIS — E041 Nontoxic single thyroid nodule: Secondary | ICD-10-CM

## 2017-12-08 DIAGNOSIS — D509 Iron deficiency anemia, unspecified: Secondary | ICD-10-CM

## 2017-12-08 DIAGNOSIS — C50812 Malignant neoplasm of overlapping sites of left female breast: Secondary | ICD-10-CM

## 2017-12-08 DIAGNOSIS — Z171 Estrogen receptor negative status [ER-]: Secondary | ICD-10-CM | POA: Diagnosis not present

## 2017-12-08 DIAGNOSIS — R911 Solitary pulmonary nodule: Secondary | ICD-10-CM | POA: Diagnosis not present

## 2017-12-08 DIAGNOSIS — F419 Anxiety disorder, unspecified: Secondary | ICD-10-CM

## 2017-12-08 DIAGNOSIS — D259 Leiomyoma of uterus, unspecified: Secondary | ICD-10-CM

## 2017-12-08 DIAGNOSIS — R4589 Other symptoms and signs involving emotional state: Secondary | ICD-10-CM

## 2017-12-08 LAB — CBC WITH DIFFERENTIAL/PLATELET
Basophils Absolute: 0 10*3/uL (ref 0–0.1)
Basophils Relative: 0 %
EOS ABS: 0 10*3/uL (ref 0–0.7)
EOS PCT: 0 %
HCT: 23.5 % — ABNORMAL LOW (ref 35.0–47.0)
Hemoglobin: 7.5 g/dL — ABNORMAL LOW (ref 12.0–16.0)
Lymphocytes Relative: 12 %
Lymphs Abs: 1.2 10*3/uL (ref 1.0–3.6)
MCH: 26.4 pg (ref 26.0–34.0)
MCHC: 31.8 g/dL — AB (ref 32.0–36.0)
MCV: 83.1 fL (ref 80.0–100.0)
MONO ABS: 0.6 10*3/uL (ref 0.2–0.9)
Monocytes Relative: 5 %
Neutro Abs: 8.5 10*3/uL — ABNORMAL HIGH (ref 1.4–6.5)
Neutrophils Relative %: 83 %
PLATELETS: 522 10*3/uL — AB (ref 150–440)
RBC: 2.82 MIL/uL — AB (ref 3.80–5.20)
RDW: 20.8 % — AB (ref 11.5–14.5)
WBC: 10.4 10*3/uL (ref 3.6–11.0)

## 2017-12-08 LAB — COMPREHENSIVE METABOLIC PANEL
ALT: 7 U/L — AB (ref 14–54)
ANION GAP: 10 (ref 5–15)
AST: 19 U/L (ref 15–41)
Albumin: 3.7 g/dL (ref 3.5–5.0)
Alkaline Phosphatase: 46 U/L (ref 38–126)
BUN: 10 mg/dL (ref 6–20)
CHLORIDE: 104 mmol/L (ref 101–111)
CO2: 21 mmol/L — AB (ref 22–32)
Calcium: 8.9 mg/dL (ref 8.9–10.3)
Creatinine, Ser: 0.66 mg/dL (ref 0.44–1.00)
GFR calc non Af Amer: >60 mL/min (ref >60)
Glucose, Bld: 104 mg/dL — ABNORMAL HIGH (ref 65–99)
POTASSIUM: 3.9 mmol/L (ref 3.5–5.1)
SODIUM: 135 mmol/L (ref 135–145)
Total Bilirubin: 0.2 mg/dL — ABNORMAL LOW (ref 0.3–1.2)
Total Protein: 7.9 g/dL (ref 6.5–8.1)

## 2017-12-08 LAB — IRON AND TIBC
IRON: 16 ug/dL — AB (ref 28–170)
Saturation Ratios: 4 % — ABNORMAL LOW (ref 10.4–31.8)
TIBC: 382 ug/dL (ref 250–450)
UIBC: 366 ug/dL

## 2017-12-08 LAB — FERRITIN: FERRITIN: 10 ng/mL — AB (ref 11–307)

## 2017-12-08 NOTE — Progress Notes (Signed)
Hematology/Oncology Consult note [facility] [phone] [phone]   Patient Care Team: Patient, No Pcp Per as PCP - General (General Practice) Patient, No Pcp Per (General Practice) REASON FOR VISIT Follow up for treatment of triple negative breast cancer and iron deficiency anemia.   Evaluation of breast cancer  HISTORY OF PRESENTING ILLNESS:  Cindy Brewer is a  47 y.o.  female with PMH listed below who was referred to me for evaluation of newly diagnosed breast cancer. Patient is a 47 year old female who felt a lump in the upper portion of the left breast about 3-4 months ago. Mammogram on August 03, 2017 showed 2 indeterminate hypoechoic masses at the 12:00 and 11:00.  These are the 2 masses corresponding to patient's palpable abnormality.There is also stable probably benign mass at the 2 o'clock position, possibly related to a complicated cyst or fibroadenoma.  #Patient underwent biopsy of the 12:00 and 11:00 mass and up pathology revealed both mass showed invasive ductal carcinoma, pathology commented that the carcinoma in the 2 specimen is somewhat similar and is grade 3.  The breast prognostic profile is performed on part 1, which showed ER PR negative and HER-2/neu negative, ki67 80%  #Patient supposed to have biopsy of the 2:00 mass which she missed the appointment.  Patient was seen and evaluated by surgeon Dr. Autumn Messing.  Given the size of the tumor and the fact that it is triple negative Dr. Marlou Starks referred the patient to see oncology to discuss about neoadjuvant chemotherapy.  Dr. Marlou Starks also ordered MRI of the breast and the patient has not have it done yet. Patient no showed a few appointment with me.  RN navigator [place] and I have tried multiple times to reach her.RN navigator [place] and I have tried multiple times to reach her.  INTERVAL HISTORY Cindy Brewer is a 47 y.o. female who has above history reviewed by me  today presents for follow up visit for management offor triple negative breast cancer and iron deficiency anemia.  # Triple negative breast cancer: she declined neoadjuvant chemotherapy and has lumpectomy with sentinel LN biopsy on 11/24/2017.  Pathology showed two separate foci of invasive mammary carcinoma with associated tumor necrosis, DCIS present, fibroadenoma 25m,  Foci 1 is 468m and foci 2 is 3558mmargins are negative. Sentinel LN 0/0 involved. Negative LVI Pathology Stage: mpT2 pN0.  Reports wound is healing well no discharge.   Fatigue: reports worsening fatigue. Chronic onset, perisistent, no aggravating or improving factors, no associated symptoms.  Anemia: hemoglobin is worse, dropped to 7.5.   Review of Systems  Constitutional: Positive for malaise/fatigue. Negative for chills, fever and weight loss.  HENT: Negative for congestion, ear discharge, ear pain, hearing loss, nosebleeds, sinus pain, sore throat and tinnitus.   Eyes: Negative for double vision, photophobia, pain, discharge and redness.  Respiratory: Negative for cough, hemoptysis, sputum production, shortness of breath and wheezing.   Cardiovascular: Negative for chest pain, palpitations, orthopnea, claudication and leg swelling.  Gastrointestinal: Negative for abdominal pain, blood in stool, constipation, diarrhea, heartburn, melena, nausea and vomiting.  Genitourinary: Negative for dysuria, flank pain, frequency, hematuria and urgency.  Musculoskeletal: Negative for back pain, myalgias and neck pain.  Skin: Negative for itching and rash.  Neurological: Negative for dizziness, tingling, tremors, sensory change, focal weakness, weakness and headaches.  Endo/Heme/Allergies: Negative for environmental allergies. Does not bruise/bleed easily.  Psychiatric/Behavioral: Negative for depression, hallucinations and substance abuse. The patient is nervous/anxious.     MEDICAL HISTORY:  Past Medical History:  Diagnosis Date   . Anemia   . Anxiety   . Cancer ([facility])    BREAST  . Depression   . Fibroid   . GERD (gastroesophageal reflux disease)     SURGICAL HISTORY: Past Surgical History:  Procedure Laterality Date  . BREAST LUMPECTOMY WITH AXILLARY LYMPH NODE BIOPSY Left 11/24/2017   Procedure: LEFT BREAST LUMPECTOMY WITH LEFT SENTINEL NODE LYMPH NODE BIOPSY;  Surgeon: Jovita Kussmaul, MD;  Location: [facility] ORS;  Service: General;  Laterality: Left;  . CESAREAN SECTION    . EYE SURGERY     DETACHED RETINA 4/519    SOCIAL HISTORY: Social History   Socioeconomic History  . Marital status: Divorced    Spouse name: Not on file  . Number of children: Not on file  . Years of education: Not on file  . Highest education level: Not on file  Occupational History  . Not on file  Social Needs  . Financial resource strain: Not on file  . Food insecurity:    Worry: Not on file    Inability: Not on file  . Transportation needs:    Medical: Not on file    Non-medical: Not on file  Tobacco Use  . Smoking status: Current Every Day Smoker  . Smokeless tobacco: Never Used  Substance and Sexual Activity  . Alcohol use: Yes    Comment: weekends  . Drug use: No  . Sexual activity: Not Currently  Lifestyle  . Physical activity:    Days per week: 7 days    Minutes per session: 20 min  . Stress: Only a little  Relationships  . Social connections:    Talks on phone: Once a week    Gets together: Once a week    Attends religious service: Never    Active member of club or organization: No    Attends meetings of clubs or organizations: Never    Relationship status: Divorced  . Intimate partner violence:    Fear of current or ex partner: No    Emotionally abused: No    Physically abused: No    Forced sexual activity: No  Other Topics Concern  . Not on file  Social History Narrative   ** Merged History Encounter **        FAMILY HISTORY: Family History  Problem Relation Age of Onset  . Stroke Maternal  Grandmother     ALLERGIES:  is allergic to demerol [meperidine]; percocet [oxycodone-acetaminophen]; and sulfa antibiotics.  MEDICATIONS:  Current Outpatient Medications  Medication Sig Dispense Refill  . ALPRAZolam (XANAX) 0.25 MG tablet Take 1 tablet (0.25 mg total) by mouth 2 (two) times daily as needed for anxiety. 30 tablet 0  . cimetidine (TAGAMET HB) 200 MG tablet Take 200 mg by mouth daily as needed (acid reflux).    [name] diphenhydramine-acetaminophen (TYLENOL PM) 25-500 MG TABS tablet Take 2 tablets by mouth at bedtime as needed (sleep/pain).    [name] HYDROcodone-acetaminophen (NORCO/VICODIN) 5-325 MG tablet Take 1-2 tablets by mouth every 6 (six) hours as needed for moderate pain or severe pain. 15 tablet 0  . Multiple Vitamin (MULTIVITAMIN WITH MINERALS) TABS tablet Take 1 tablet by mouth daily.    . traMADol (ULTRAM) 50 MG tablet Take 1-2 tablets (50-100 mg total) by mouth every 6 (six) hours as needed. 20 tablet 1   No current facility-administered medications for this visit.      PHYSICAL EXAMINATION: ECOG PERFORMANCE STATUS: 0 - Asymptomatic Vitals:  12/08/17 1519  BP: 119/75  Pulse: (!) 102  Resp: 16  Temp: 97.9 F (36.6 C)   Filed Weights   12/08/17 1519  Weight: 153 lb (69.4 kg)    Physical Exam  Constitutional: She is oriented to person, place, and time and well-developed, well-nourished, and in no distress. No distress.  HENT:  Head: Normocephalic and atraumatic.  Nose: Nose normal.  Mouth/Throat: Oropharynx is clear and moist. No oropharyngeal exudate.  Eyes: Pupils are equal, round, and reactive to light. EOM are normal. Left eye exhibits no discharge. No scleral icterus.  Neck: Normal range of motion. Neck supple. No JVD present.  Cardiovascular: Normal rate, regular rhythm and normal heart sounds.  No murmur heard. Pulmonary/Chest: Effort normal and breath sounds normal. No respiratory distress. She has no wheezes. She has no rales. She exhibits no  tenderness.  Abdominal: Soft. Bowel sounds are normal. She exhibits no distension and no mass. There is no tenderness. There is no rebound.  Musculoskeletal: Normal range of motion. She exhibits no edema or tenderness.  Lymphadenopathy:    She has no cervical adenopathy.  Neurological: She is alert and oriented to person, place, and time. No cranial nerve deficit. She exhibits normal muscle tone. Coordination normal.  Skin: Skin is warm and dry. No rash noted. She is not diaphoretic. No erythema.  Psychiatric: Memory, affect and judgment normal.  Breast exam was performed in seated and lying down position. Patient is status post left lumpectomy with a well-healing surgical scar. No evidence of any palpable masses. No evidence of axillary adenopathy. No evidence of any palpable masses or lumps in the right breast. No evidence of right axillary adenopathy      LABORATORY DATA:  I have reviewed the data as listed Lab Results  Component Value Date   WBC 10.4 12/08/2017   HGB 7.5 (L) 12/08/2017   HCT 23.5 (L) 12/08/2017   MCV 83.1 12/08/2017   PLT 522 (H) 12/08/2017   Recent Labs    08/25/17 1514 09/05/17 1154 12/08/17 1210  NA 140 139 135  K 3.9 4.1 3.9  CL 109 109 104  CO2 24 23 21*  GLUCOSE 86 97 104*  BUN 8 8 10   CREATININE 0.62 0.68 0.66  CALCIUM 8.9 8.9 8.9  GFRNONAA >60 >60 >60  GFRAA >60 >60 >60  PROT 7.1 7.9 7.9  ALBUMIN 3.8 4.0 3.7  AST 19 23 19   ALT 9* 8* 7*  ALKPHOS 26* 28* 46  BILITOT 0.3 0.5 0.2*       ASSESSMENT & PLAN:  Cancer Staging Malignant neoplasm of overlapping sites of left breast in female, estrogen receptor negative ([place]) Staging form: Breast, AJCC 8th Edition - Clinical stage from 08/25/2017: Stage IIB (cT2(2), cN0, cM0, G3, ER: Negative, PR: Negative, HER2: Negative) - Signed by Earlie Server, MD on 08/25/2017  1. Malignant neoplasm of overlapping sites of left breast in female, estrogen receptor negative ([place])   2. Iron deficiency anemia,  unspecified iron deficiency anemia type   3. Denial   4. Anxiety    # Multifocal triple negative breast cancer clinically Stage IIB (pT2 pN0,cM0) Pathology results were reviewed and discussed with patient in details. I discussed about the hight recurrence rate, aggressive disease feature, poor prognosis. I highly recommend adjuvant chemotherapy with AC+ TC Patient is not interested at this moment and declined chemotherapy. She says she will update me if she changes her mind.   Clinically M0 disease. CT chest abdomen pelvis (10/06/2017) was Independently reviewed  by me and discussed with patient. It showed negative except single tiny lung nodule which needs to be closely monitored, in addition, also hypodense left posterior thyroid nodule. Need to further evaluate with thyroid US.  Plan repeat to CT chest abdomen pelvis in 3 months. Check Thyroid US.   # refer to Dr.Chrystal for radiation.   # Iron deficiency anemia:  Previously tolerate Venofer and anemia improved initially and now worsened again.  Plan Venofer weekly x 5  # uterine Fibroids: likely the reason of iron deficiency.   ll questions were answered. The patient knows to call the clinic with any problems questions or concerns.  Return of visit:  6 weeks with repeat labs.  Total face to face encounter time for this patient visit was 40 min. >50% of the time was  spent in counseling and coordination of care.   Earlie Server, MD, PhD Hematology Oncology [facility] Pager- [phone] 12/08/2017

## 2017-12-08 NOTE — Progress Notes (Signed)
Patient here today for follow up.   

## 2017-12-14 ENCOUNTER — Ambulatory Visit: Admission: RE | Admit: 2017-12-14 | Payer: Medicaid Other | Source: Ambulatory Visit | Admitting: Radiation Oncology

## 2017-12-15 ENCOUNTER — Inpatient Hospital Stay: Payer: Medicaid Other

## 2017-12-15 ENCOUNTER — Other Ambulatory Visit: Payer: Self-pay | Admitting: Oncology

## 2017-12-15 ENCOUNTER — Encounter: Payer: Self-pay | Admitting: *Deleted

## 2017-12-15 VITALS — BP 126/82 | HR 86 | Resp 18

## 2017-12-15 DIAGNOSIS — E041 Nontoxic single thyroid nodule: Secondary | ICD-10-CM

## 2017-12-15 DIAGNOSIS — Z171 Estrogen receptor negative status [ER-]: Principal | ICD-10-CM

## 2017-12-15 DIAGNOSIS — D509 Iron deficiency anemia, unspecified: Secondary | ICD-10-CM | POA: Diagnosis not present

## 2017-12-15 DIAGNOSIS — C50812 Malignant neoplasm of overlapping sites of left female breast: Secondary | ICD-10-CM

## 2017-12-15 MED ORDER — SODIUM CHLORIDE 0.9 % IV SOLN
INTRAVENOUS | Status: DC
  Administered 2017-12-15: 11:00:00 via INTRAVENOUS
  Filled 2017-12-15: qty 1000

## 2017-12-15 MED ORDER — IRON SUCROSE 20 MG/ML IV SOLN
200.0000 mg | INTRAVENOUS | Status: DC
  Administered 2017-12-15: 200 mg via INTRAVENOUS
  Filled 2017-12-15: qty 10

## 2017-12-15 NOTE — Progress Notes (Signed)
  Oncology Nurse Navigator Documentation  Navigator Location: [facility] (12/15/17 0900)   )Navigator Encounter Type: Telephone (12/15/17 0900) Telephone: Lahoma Crocker Call (12/15/17 0900)                       Barriers/Navigation Needs: Coordination of Care (12/15/17 0900)                          Time Spent with Patient: 15 (12/15/17 0900)   Patient no-showed for her radiation oncology consult yesterday.  Left message to remind her of her appointment today at 11:15 and the need to reschedule her consult.

## 2017-12-15 NOTE — Progress Notes (Signed)
US thyroid 

## 2017-12-18 ENCOUNTER — Telehealth: Payer: Self-pay | Admitting: Oncology

## 2017-12-18 NOTE — Telephone Encounter (Signed)
I have made several attempts to ger Cindy Brewer scheduled for Venofer tx, Thyroid US and a CT of the abd\pelvis. The patient has not returned requested phone calls or responded to face to face request to get these appt scheduled. I received a inbasket from [name] on 6/10 to get patient scheduled for appt when she arrived for a tx on 6/14. I approached pt about scheduling appts and she told me that we could not scheduled her appts because she didn't know her work schedule. The patient was instructed to call the clinic back once she was home so we could get the appts scheduled. I have yet to receive a call from her regarding these appts. We have had a lot to trouble getting the patient scheduled for appts in the past also. I have attached her care team to make them aware.

## 2017-12-20 ENCOUNTER — Ambulatory Visit: Payer: Medicaid Other | Attending: Radiation Oncology | Admitting: Radiation Oncology

## 2017-12-27 ENCOUNTER — Encounter: Payer: Self-pay | Admitting: *Deleted

## 2017-12-27 NOTE — Progress Notes (Signed)
  Oncology Nurse Navigator Documentation  Navigator Location: [facility] (12/27/17 1500)   )Navigator Encounter Type: Telephone (12/27/17 1500) Telephone: Lahoma Crocker Call (12/27/17 1500)                       Barriers/Navigation Needs: Coordination of Care (12/27/17 1500)                          Time Spent with Patient: 15 (12/27/17 1500)   Left patient a message to return my call.  She has not called back to schedule her appointments requested by DR. Yu.

## 2017-12-29 ENCOUNTER — Inpatient Hospital Stay: Payer: Medicaid Other

## 2018-01-09 ENCOUNTER — Telehealth: Payer: Self-pay | Admitting: Oncology

## 2018-01-09 NOTE — Telephone Encounter (Signed)
Number for listed patient is just ringing busy. Unable to leave a VM at this time.

## 2018-01-20 ENCOUNTER — Encounter: Payer: Self-pay | Admitting: Emergency Medicine

## 2018-01-20 ENCOUNTER — Emergency Department
Admission: EM | Admit: 2018-01-20 | Discharge: 2018-01-20 | Disposition: A | Discharge status: Home / Self Care | Payer: Medicaid Other | Attending: Emergency Medicine | Admitting: Emergency Medicine

## 2018-01-20 ENCOUNTER — Emergency Department: Payer: Medicaid Other

## 2018-01-20 DIAGNOSIS — C50812 Malignant neoplasm of overlapping sites of left female breast: Secondary | ICD-10-CM | POA: Diagnosis not present

## 2018-01-20 DIAGNOSIS — Z79899 Other long term (current) drug therapy: Secondary | ICD-10-CM | POA: Insufficient documentation

## 2018-01-20 DIAGNOSIS — D649 Anemia, unspecified: Secondary | ICD-10-CM | POA: Insufficient documentation

## 2018-01-20 DIAGNOSIS — N921 Excessive and frequent menstruation with irregular cycle: Secondary | ICD-10-CM

## 2018-01-20 DIAGNOSIS — F17210 Nicotine dependence, cigarettes, uncomplicated: Secondary | ICD-10-CM | POA: Insufficient documentation

## 2018-01-20 DIAGNOSIS — R1030 Lower abdominal pain, unspecified: Secondary | ICD-10-CM | POA: Diagnosis not present

## 2018-01-20 DIAGNOSIS — Z171 Estrogen receptor negative status [ER-]: Secondary | ICD-10-CM | POA: Diagnosis not present

## 2018-01-20 DIAGNOSIS — D219 Benign neoplasm of connective and other soft tissue, unspecified: Secondary | ICD-10-CM | POA: Diagnosis not present

## 2018-01-20 DIAGNOSIS — R109 Unspecified abdominal pain: Secondary | ICD-10-CM | POA: Diagnosis present

## 2018-01-20 LAB — COMPREHENSIVE METABOLIC PANEL
ALK PHOS: 26 U/L — AB (ref 38–126)
AST: 14 U/L — AB (ref 15–41)
Albumin: 3.5 g/dL (ref 3.5–5.0)
Anion gap: 7 (ref 5–15)
BILIRUBIN TOTAL: 0.3 mg/dL (ref 0.3–1.2)
BUN: 8 mg/dL (ref 6–20)
CALCIUM: 8.5 mg/dL — AB (ref 8.9–10.3)
CHLORIDE: 109 mmol/L (ref 98–111)
CO2: 22 mmol/L (ref 22–32)
Creatinine, Ser: 0.82 mg/dL (ref 0.44–1.00)
GFR calc Af Amer: >60 mL/min (ref >60)
Glucose, Bld: 115 mg/dL — ABNORMAL HIGH (ref 70–99)
Potassium: 3.7 mmol/L (ref 3.5–5.1)
Sodium: 138 mmol/L (ref 135–145)
TOTAL PROTEIN: 7.2 g/dL (ref 6.5–8.1)

## 2018-01-20 LAB — URINALYSIS, COMPLETE (UACMP) WITH MICROSCOPIC
Bacteria, UA: NONE SEEN
Glucose, UA: NEGATIVE mg/dL
Ketones, ur: 20 mg/dL — AB
Nitrite: NEGATIVE
PROTEIN: 100 mg/dL — AB
RBC / HPF: >50 RBC/hpf — ABNORMAL HIGH (ref 0–5)
SPECIFIC GRAVITY, URINE: 1.026 (ref 1.005–1.030)
pH: 5 (ref 5.0–8.0)

## 2018-01-20 LAB — CBC WITH DIFFERENTIAL/PLATELET
BASOS ABS: 0 10*3/uL (ref 0–0.1)
Basophils Relative: 1 %
Eosinophils Absolute: 0 10*3/uL (ref 0–0.7)
Eosinophils Relative: 0 %
HEMATOCRIT: 21.1 % — AB (ref 35.0–47.0)
Hemoglobin: 6.6 g/dL — ABNORMAL LOW (ref 12.0–16.0)
LYMPHS PCT: 11 %
Lymphs Abs: 0.7 10*3/uL — ABNORMAL LOW (ref 1.0–3.6)
MCH: 23.7 pg — ABNORMAL LOW (ref 26.0–34.0)
MCHC: 31.1 g/dL — ABNORMAL LOW (ref 32.0–36.0)
MCV: 76.3 fL — AB (ref 80.0–100.0)
Monocytes Absolute: 0.4 10*3/uL (ref 0.2–0.9)
Monocytes Relative: 6 %
NEUTROS ABS: 4.9 10*3/uL (ref 1.4–6.5)
Neutrophils Relative %: 82 %
Platelets: 363 10*3/uL (ref 150–440)
RBC: 2.77 MIL/uL — AB (ref 3.80–5.20)
RDW: 20.1 % — ABNORMAL HIGH (ref 11.5–14.5)
WBC: 6 10*3/uL (ref 3.6–11.0)

## 2018-01-20 LAB — LIPASE, BLOOD: LIPASE: 25 U/L (ref 11–51)

## 2018-01-20 LAB — POCT PREGNANCY, URINE: Preg Test, Ur: NEGATIVE

## 2018-01-20 MED ORDER — IBUPROFEN 600 MG PO TABS: ORAL_TABLET | ORAL | 0 refills | Status: DC

## 2018-01-20 MED ORDER — TRAMADOL HCL 50 MG PO TABS: 50.0000 mg | ORAL_TABLET | Freq: Four times a day (QID) | ORAL | 1 refills | Status: DC | PRN

## 2018-01-20 NOTE — Discharge Instructions (Signed)
As we discussed, you need to follow-up as soon as possible with your OB/GYN as well as your oncologist.  Your long-term vaginal bleeding has caused your already low hemoglobin level to drop even further.  You likely will need an outpatient blood transfusion before he becomes symptomatic from your anemia.  Additionally, the fibroids in your uterus have an unusual appearance on CT scan and you need to discuss this with your OB/GYN at the next available opportunity; it is likely the cause of your persistent bleeding.  Take over-the-counter pain medicine as needed for pain control.  Take Norco as prescribed for severe pain. Do not drink alcohol, drive or participate in any other potentially dangerous activities while taking this medication as it may make you sleepy. Do not take this medication with any other sedating medications, either prescription or over-the-counter. If you were prescribed Percocet or Vicodin, do not take these with acetaminophen (Tylenol) as it is already contained within these medications.   This medication is an opiate (or narcotic) pain medication and can be habit forming.  Use it as little as possible to achieve adequate pain control.  Do not use or use it with extreme caution if you have a history of opiate abuse or dependence.  If you are on a pain contract with your primary care doctor or a pain specialist, be sure to let them know you were prescribed this medication today from the [facility] Emergency Department.  This medication is intended for your use only - do not give any to anyone else and keep it in a secure place where nobody else, especially children, have access to it.  It will also cause or worsen constipation, so you may want to consider taking an over-the-counter stool softener while you are taking this medication.

## 2018-01-20 NOTE — ED Triage Notes (Signed)
Patient with complaint of right lower abdominal pain and back pain times three days. Patient with complaint of pain with urination and nausea.

## 2018-01-20 NOTE — ED Notes (Signed)
1 unsuccessful PIV attempt by this RN (right AC).

## 2018-01-20 NOTE — ED Provider Notes (Signed)
[facility] Emergency Department Provider Note  ____________________________________________   First MD Initiated Contact with Patient 01/20/18 0301     (approximate)  I have reviewed the triage vital signs and the nursing notes.   HISTORY  Chief Complaint Abdominal Pain    HPI Cindy Brewer is a 47 y.o. female With medical history that includes but is not limited to history of breast cancer status post lumpectomy, chronic anemia requiring outpatient transfusions, and fibroids with dysfunctional uterine bleeding.  She has specialist for all of these issues although they are not here at [facility]  She says that she is behind schedule for getting an infusion seeing her oncologist.  The reason she presents tonight is for persistent abdominal pain that is been present for about a week.  Nothing particular makes it better or worse.  She thinks it is related to her ongoing vaginal bleeding that has been present for about 30 days which is longer than usual for her.  She is passing clots but she is used to that and it is fairly common for her.  She has been worked up by her gynecologist regarding the fibroids and was told that she should have a hysterectomy but she is not interested in that.  She has plans to go back to see her gynecologist but is also been going to an oncologist for the infusions and her breast cancer treatment.  She states that the pain over the last week is become severe but waxes and wanes in intensity.  Typically she takes over-the-counter pain medicine but it has not been working recently.  He states that heating pads and hot water bottles make the pain feel better and nothing in particular makes it worse.  In spite of the blood loss she has not been feeling lightheaded or like she is going to pass out although sometimes she does feel a little bit dizzy.  She is not short of breath and has had some nausea but no vomiting.  She has no upper  abdominal pain.  She denies fever/chills, chest pain, shortness of breath.   Past Medical History:  Diagnosis Date  . Anemia   . Anxiety   . Cancer (HCC)    BREAST  . Depression   . Fibroid   . GERD (gastroesophageal reflux disease)     Patient Active Problem List   Diagnosis Date Noted  . Malignant neoplasm of overlapping sites of left breast in female, estrogen receptor negative ([place]) 08/25/2017    Past Surgical History:  Procedure Laterality Date  . BREAST LUMPECTOMY WITH AXILLARY LYMPH NODE BIOPSY Left 11/24/2017   Procedure: LEFT BREAST LUMPECTOMY WITH LEFT SENTINEL NODE LYMPH NODE BIOPSY;  Surgeon: Jovita Kussmaul, MD;  Location: [facility] ORS;  Service: General;  Laterality: Left;  . CESAREAN SECTION    . EYE SURGERY     DETACHED RETINA 4/519    Prior to Admission medications   Medication Sig Start Date End Date Taking? Authorizing Provider  ALPRAZolam (XANAX) 0.25 MG tablet Take 1 tablet (0.25 mg total) by mouth 2 (two) times daily as needed for anxiety. 09/05/17   Earlie Server, MD  cimetidine (TAGAMET HB) 200 MG tablet Take 200 mg by mouth daily as needed (acid reflux).    [provider]  diphenhydramine-acetaminophen (TYLENOL PM) 25-500 MG TABS tablet Take 2 tablets by mouth at bedtime as needed (sleep/pain).    [provider]  HYDROcodone-acetaminophen (NORCO/VICODIN) 5-325 MG tablet Take 1-2 tablets by mouth every  6 (six) hours as needed for moderate pain or severe pain. 11/24/17   Jovita Kussmaul, MD  ibuprofen (ADVIL,MOTRIN) 600 MG tablet Take 1 tablet by mouth three times daily with meals 01/20/18   Hinda Kehr, MD  Multiple Vitamin (MULTIVITAMIN WITH MINERALS) TABS tablet Take 1 tablet by mouth daily.    [provider]  traMADol (ULTRAM) 50 MG tablet Take 1-2 tablets (50-100 mg total) by mouth every 6 (six) hours as needed. 01/20/18   Hinda Kehr, MD    Allergies Demerol [meperidine]; Percocet [oxycodone-acetaminophen]; and Sulfa  antibiotics  Family History  Problem Relation Age of Onset  . Stroke Maternal Grandmother     Social History Social History   Tobacco Use  . Smoking status: Current Every Day Smoker  . Smokeless tobacco: Never Used  Substance Use Topics  . Alcohol use: Yes    Comment: weekends  . Drug use: No    Review of Systems Constitutional: No fever/chills Eyes: No visual changes. ENT: No sore throat. Cardiovascular: Denies chest pain. No near-syncope nor syncope. Respiratory: Denies shortness of breath. Gastrointestinal: Lower abd pain for about a week.  Nausea, no vomiting.  No diarrhea.  No constipation. Genitourinary: Heavy vaginal bleeding for about a month w/ hx of DUB Musculoskeletal: Negative for neck pain.  Negative for back pain. Integumentary: Negative for rash. Neurological: Negative for headaches, focal weakness or numbness.  Sometimes dizzy, no lightheadedness   ____________________________________________   PHYSICAL EXAM:  VITAL SIGNS: ED Triage Vitals  Enc Vitals Group     BP 01/20/18 0206 101/66     Pulse Rate 01/20/18 0206 (!) 121     Resp 01/20/18 0206 18     Temp 01/20/18 0206 99 F (37.2 C)     Temp Source 01/20/18 0206 Oral     SpO2 01/20/18 0206 100 %     Weight 01/20/18 0207 66.7 kg (147 lb)     Height 01/20/18 0207 1.676 m (5\' 6" )     Head Circumference --      Peak Flow --      Pain Score 01/20/18 0206 8     Pain Loc --      Pain Edu? --      Excl. in [place]? --     Constitutional: Alert and oriented. Well appearing and in no acute distress.  She is watching a movie on her phone, interactive and laughing and joking with this. Eyes: Conjunctivae are normal.  Head: Atraumatic. Nose: No congestion/rhinnorhea. Mouth/Throat: Mucous membranes are moist. Neck: No stridor.  No meningeal signs.   Cardiovascular: Normal rate, regular rhythm. Good peripheral circulation. Grossly normal heart sounds. Respiratory: Normal respiratory effort.  No  retractions. Lungs CTAB. Gastrointestinal: Soft and nontender. No distention.  Genitourinary: Deferred Musculoskeletal: No lower extremity tenderness nor edema. No gross deformities of extremities. Neurologic:  Normal speech and language. No gross focal neurologic deficits are appreciated.  Skin:  Skin is warm, dry and intact. No rash noted. Psychiatric: Mood and affect are normal. Speech and behavior are normal.  ____________________________________________   LABS (all labs ordered are listed, but only abnormal results are displayed)  Labs Reviewed  URINALYSIS, COMPLETE (UACMP) WITH MICROSCOPIC - Abnormal; Notable for the following components:      Result Value   Color, Urine AMBER (*)    APPearance HAZY (*)    Hgb urine dipstick MODERATE (*)    Bilirubin Urine SMALL (*)    Ketones, ur 20 (*)    Protein, ur 100 (*)  Leukocytes, UA TRACE (*)    RBC / HPF >50 (*)    All other components within normal limits  CBC WITH DIFFERENTIAL/PLATELET - Abnormal; Notable for the following components:   RBC 2.77 (*)    Hemoglobin 6.6 (*)    HCT 21.1 (*)    MCV 76.3 (*)    MCH 23.7 (*)    MCHC 31.1 (*)    RDW 20.1 (*)    Lymphs Abs 0.7 (*)    All other components within normal limits  COMPREHENSIVE METABOLIC PANEL - Abnormal; Notable for the following components:   Glucose, Bld 115 (*)    Calcium 8.5 (*)    AST 14 (*)    Alkaline Phosphatase 26 (*)    All other components within normal limits  LIPASE, BLOOD  POC URINE PREG, ED   ____________________________________________  EKG  None - EKG not ordered by ED physician ____________________________________________  RADIOLOGY   ED MD interpretation: Uterine fibroids with an area in the middle that appears necrotic.  No other acute abnormalities  Official radiology report(s): Ct Renal Stone Study  Result Date: 01/20/2018 CLINICAL DATA:  47 year old female with right lower quadrant abdominal pain. EXAM: CT ABDOMEN AND PELVIS  WITHOUT CONTRAST TECHNIQUE: Multidetector CT imaging of the abdomen and pelvis was performed following the standard protocol without IV contrast. COMPARISON:  CT of the abdomen pelvis dated 10/06/2017 FINDINGS: Evaluation of this exam is limited in the absence of intravenous contrast. Lower chest: The visualized lung bases are clear. There is hypoattenuation of the cardiac blood pool suggestive of a degree of anemia. Clinical correlation is recommended. No intra-abdominal free air.  Small free fluid within the pelvis. Hepatobiliary: Subcentimeter hypodense focus in the inferior right lobe of the liver (series 5, image 61) is not characterized but may represent a cyst or hemangioma. The liver is otherwise unremarkable. No intrahepatic biliary ductal dilatation. The gallbladder is unremarkable as well. Pancreas: Unremarkable. No pancreatic ductal dilatation or surrounding inflammatory changes. Spleen: Normal in size without focal abnormality. Adrenals/Urinary Tract: The adrenal glands are unremarkable. Artifact versus punctate nonobstructing right renal upper pole calculus (coronal series 5, image 75). No hydronephrosis. The left kidney is unremarkable. The urinary bladder is only partially distended and grossly unremarkable. Stomach/Bowel: There is moderate stool throughout the colon. There is no bowel obstruction or active inflammation. The appendix is normal. Vascular/Lymphatic: The abdominal aorta and IVC are grossly unremarkable on this noncontrast CT. No portal venous gas. There is no adenopathy. Reproductive: The uterus is enlarged and myomatous. There is an ill-defined 6.1 x 6.5 cm fibroid in the left uterine body with interval development of low attenuation centrally most consistent with necrotic changes. Correlation with history of recent uterine artery embolization recommended. Evaluation of the pelvic structures is limited in the absence of intravenous contrast. There is a tampon in the vagina. Other:  Anterior pelvic wall C-section scar. Musculoskeletal: No acute or significant osseous findings. IMPRESSION: 1. No hydronephrosis or obstructing stone. Artifact versus punctate nonobstructing right renal upper pole calculus. 2. Moderate colonic stool burden. No bowel obstruction or active inflammation. Normal appendix. 3. Enlarged myomatous uterus with necrotic changes of a dominant left uterine body fibroid. Correlation with history of recent [place] recommended. Electronically Signed   By: Anner Crete M.D.   On: 01/20/2018 03:31    ____________________________________________   PROCEDURES  Critical Care performed: No   Procedure(s) performed:   Procedures   ____________________________________________   INITIAL IMPRESSION / ASSESSMENT AND PLAN / ED COURSE  As part of my medical decision making, I reviewed the following data within the [place] notes reviewed and incorporated, Labs reviewed , Old chart reviewed and Notes from prior ED visits    Differential diagnosis includes, but is not limited to, dysfunctional uterine bleeding secondary to fibroids, acute on chronic anemia due to blood loss, neoplasm, renal colic, constipation, acute intra-abdominal infection such as appendicitis, biliary colic, pancreatitis.  I obtained a CT renal stone protocol to evaluate for the possibility of renal colic and there is demonstration of the uterine fibroids with some necrotic center.  I believe that her hematuria is likely the result of the ongoing vaginal bleeding; I initially mistook it for hematuria.  The CMP was generally reassuring and the patient has no metabolic or electrolyte abnormalities and good kidney function.  Her CBC is notable for no leukocytosis and significant anemia with a hemoglobin of 6.6, but the patient tells me her baseline is somewhere around 7.5 and the last time she had a checked it was 7.  She appears completely asymptomatic even though she reports  she has been occasionally dizzy over the last week.  She was initially tachycardic upon arrival but she has been in the emergency department for at least 5 hours and has had a heart rate consistently in the 80s during that time.  She is reporting severe pain but says it is actually not bad right now but she knows it will get bad again later.  I explained to her how important it is that she follow-up with OB/GYN even though it does not appear to be a surgical issue at this time.  Similarly, she needs a transfusion but she is already set up as an outpatient with oncology and she reported to me that she even has a case manager for ongoing issues and she would rather follow-up with all of her outpatient doctors.  I think that is appropriate as there is no indication for admission at this time.  She is comfortable with the plan for outpatient pain control and close follow-up.  I explained to her how important it is that she follow-up and she says that she understands.  She reports a history of anaphylaxis with throat swelling to Percocet.  It does appear that she has taken tramadol in the past successfully.  I checked the drug database and she is low risk for abuse with no recent prescriptions.  I prescribed tramadol as well as ibuprofen which she said works for her most of the time.  I encouraged her to continue using heating pads or hot water bottle since that seems also help and I gave strict return precautions.  She understands and agrees with the plan.    ____________________________________________  FINAL CLINICAL IMPRESSION(S) / ED DIAGNOSES  Final diagnoses:  Lower abdominal pain  Acute on chronic anemia  Menometrorrhagia  Fibroids     MEDICATIONS GIVEN DURING THIS VISIT:  Medications - No data to display   ED Discharge Orders        Ordered    traMADol (ULTRAM) 50 MG tablet  Every 6 hours PRN     01/20/18 0533    ibuprofen (ADVIL,MOTRIN) 600 MG tablet     01/20/18 0533        Note:  This document was prepared using Dragon voice recognition software and may include unintentional dictation errors.    Hinda Kehr, MD 01/20/18 [phone]

## 2018-01-20 NOTE — ED Notes (Signed)
POC Urine Preg NEGATIVE 

## 2018-01-20 NOTE — ED Notes (Signed)
No peripheral IV placed this visit.   Discharge instructions reviewed with patient. Questions fielded by this RN. Patient verbalizes understanding of instructions. Patient discharged home in stable condition per [place]. No acute distress noted at time of discharge.   

## 2018-01-22 ENCOUNTER — Encounter: Payer: Self-pay | Admitting: *Deleted

## 2018-01-22 NOTE — Progress Notes (Signed)
  Oncology Nurse Navigator Documentation  Navigator Location: [facility] (01/22/18 1600)   )Navigator Encounter Type: Telephone (01/22/18 1600) Telephone: Incoming Call (01/22/18 1600)                       Barriers/Navigation Needs: Coordination of Care (01/22/18 1600)                          Time Spent with Patient: 15 (01/22/18 1600)   Patient called today and states she was in the ED this weekend with abdominal pain.  States she has a fibroid and has been bleeding for the past month.  States the ED recommended follow-up with GYN, but she needs a referral.  She is scheduled to see Dr. Tasia Catchings on Friday for Iron infusion.  Patient's last Hgb was 6.6.  Will forward message to Dr. Tasia Catchings to see if she wants to refer her prior to seeing patient on Friday.  Otherwise, I encouraged patient to discuss GYN referral with patient on Friday.

## 2018-01-24 ENCOUNTER — Other Ambulatory Visit: Payer: Self-pay | Admitting: Oncology

## 2018-01-24 DIAGNOSIS — D509 Iron deficiency anemia, unspecified: Secondary | ICD-10-CM

## 2018-01-26 ENCOUNTER — Inpatient Hospital Stay: Payer: Medicaid Other

## 2018-01-26 ENCOUNTER — Encounter: Payer: Self-pay | Admitting: Oncology

## 2018-01-26 ENCOUNTER — Ambulatory Visit: Payer: Self-pay

## 2018-01-26 ENCOUNTER — Inpatient Hospital Stay (HOSPITAL_BASED_OUTPATIENT_CLINIC_OR_DEPARTMENT_OTHER): Payer: Medicaid Other | Admitting: Oncology

## 2018-01-26 ENCOUNTER — Inpatient Hospital Stay: Payer: Medicaid Other | Attending: Oncology

## 2018-01-26 ENCOUNTER — Other Ambulatory Visit: Payer: Self-pay

## 2018-01-26 ENCOUNTER — Ambulatory Visit: Payer: Self-pay | Admitting: Oncology

## 2018-01-26 VITALS — BP 109/73 | HR 98 | Temp 97.7°F | Resp 18 | Wt 144.2 lb

## 2018-01-26 VITALS — BP 111/70 | HR 76 | Resp 18

## 2018-01-26 DIAGNOSIS — Z79899 Other long term (current) drug therapy: Secondary | ICD-10-CM

## 2018-01-26 DIAGNOSIS — E041 Nontoxic single thyroid nodule: Secondary | ICD-10-CM

## 2018-01-26 DIAGNOSIS — Z171 Estrogen receptor negative status [ER-]: Secondary | ICD-10-CM | POA: Insufficient documentation

## 2018-01-26 DIAGNOSIS — C50812 Malignant neoplasm of overlapping sites of left female breast: Secondary | ICD-10-CM | POA: Insufficient documentation

## 2018-01-26 DIAGNOSIS — D509 Iron deficiency anemia, unspecified: Secondary | ICD-10-CM

## 2018-01-26 DIAGNOSIS — R911 Solitary pulmonary nodule: Secondary | ICD-10-CM | POA: Diagnosis not present

## 2018-01-26 DIAGNOSIS — D500 Iron deficiency anemia secondary to blood loss (chronic): Secondary | ICD-10-CM

## 2018-01-26 LAB — IRON AND TIBC
Iron: 10 ug/dL — ABNORMAL LOW (ref 28–170)
SATURATION RATIOS: 3 % — AB (ref 10.4–31.8)
TIBC: 308 ug/dL (ref 250–450)
UIBC: 298 ug/dL

## 2018-01-26 LAB — COMPREHENSIVE METABOLIC PANEL
ALT: 8 U/L (ref 0–44)
AST: 13 U/L — AB (ref 15–41)
Albumin: 3.7 g/dL (ref 3.5–5.0)
Alkaline Phosphatase: 34 U/L — ABNORMAL LOW (ref 38–126)
Anion gap: 8 (ref 5–15)
BUN: 6 mg/dL (ref 6–20)
CHLORIDE: 105 mmol/L (ref 98–111)
CO2: 23 mmol/L (ref 22–32)
CREATININE: 0.72 mg/dL (ref 0.44–1.00)
Calcium: 8.8 mg/dL — ABNORMAL LOW (ref 8.9–10.3)
GFR calc Af Amer: >60 mL/min (ref >60)
GFR calc non Af Amer: >60 mL/min (ref >60)
Glucose, Bld: 101 mg/dL — ABNORMAL HIGH (ref 70–99)
POTASSIUM: 4 mmol/L (ref 3.5–5.1)
SODIUM: 136 mmol/L (ref 135–145)
Total Bilirubin: 0.2 mg/dL — ABNORMAL LOW (ref 0.3–1.2)
Total Protein: 7.6 g/dL (ref 6.5–8.1)

## 2018-01-26 LAB — CBC WITH DIFFERENTIAL/PLATELET
Basophils Absolute: 0 10*3/uL (ref 0–0.1)
Basophils Relative: 1 %
EOS PCT: 3 %
Eosinophils Absolute: 0.1 10*3/uL (ref 0–0.7)
HCT: 21.8 % — ABNORMAL LOW (ref 35.0–47.0)
HEMOGLOBIN: 6.7 g/dL — AB (ref 12.0–16.0)
LYMPHS ABS: 1.3 10*3/uL (ref 1.0–3.6)
LYMPHS PCT: 28 %
MCH: 23.1 pg — AB (ref 26.0–34.0)
MCHC: 30.7 g/dL — AB (ref 32.0–36.0)
MCV: 75.3 fL — AB (ref 80.0–100.0)
MONOS PCT: 10 %
Monocytes Absolute: 0.5 10*3/uL (ref 0.2–0.9)
NEUTROS PCT: 58 %
Neutro Abs: 2.5 10*3/uL (ref 1.4–6.5)
Platelets: 432 10*3/uL (ref 150–440)
RBC: 2.89 MIL/uL — AB (ref 3.80–5.20)
RDW: 20.5 % — ABNORMAL HIGH (ref 11.5–14.5)
WBC: 4.4 10*3/uL (ref 3.6–11.0)

## 2018-01-26 LAB — FERRITIN: Ferritin: 4 ng/mL — ABNORMAL LOW (ref 11–307)

## 2018-01-26 MED ORDER — SENNOSIDES-DOCUSATE SODIUM 8.6-50 MG PO TABS: 2.0000 | ORAL_TABLET | Freq: Every day | ORAL | 0 refills | Status: DC

## 2018-01-26 MED ORDER — IRON SUCROSE 20 MG/ML IV SOLN
200.0000 mg | INTRAVENOUS | Status: DC
  Administered 2018-01-26: 200 mg via INTRAVENOUS
  Filled 2018-01-26: qty 10

## 2018-01-26 MED ORDER — SODIUM CHLORIDE 0.9 % IV SOLN
INTRAVENOUS | Status: DC
  Administered 2018-01-26: 13:00:00 via INTRAVENOUS
  Filled 2018-01-26: qty 1000

## 2018-01-26 NOTE — Progress Notes (Signed)
Patient here for follow up. States she has been feeling nauseated and has been having constipation.

## 2018-01-26 NOTE — Progress Notes (Signed)
Hematology/Oncology Follow up note [facility] Telephone:([phone] [phone]   Patient Care Team: Patient, No Pcp Per as PCP - General (General Practice) Patient, No Pcp Per (General Practice) REASON FOR VISIT Follow up for treatment of triple negative breast cancer and iron deficiency anemia.     HISTORY OF PRESENTING ILLNESS:  Cindy Brewer is a  47 y.o.  female with PMH listed below who was referred to me for evaluation of newly diagnosed breast cancer. Patient is a 47 year old female who felt a lump in the upper portion of the left breast about 3-4 months ago. Mammogram on August 03, 2017 showed 2 indeterminate hypoechoic masses at the 12:00 and 11:00.  These are the 2 masses corresponding to patient's palpable abnormality.There is also stable probably benign mass at the 2 o'clock position, possibly related to a complicated cyst or fibroadenoma.  #Patient underwent biopsy of the 12:00 and 11:00 mass and up pathology revealed both mass showed invasive ductal carcinoma, pathology commented that the carcinoma in the 2 specimen is somewhat similar and is grade 3.  The breast prognostic profile is performed on part 1, which showed ER PR negative and HER-2/neu negative, ki67 80%  #Patient supposed to have biopsy of the 2:00 mass which she missed the appointment.  Patient was seen and evaluated by surgeon Dr. Autumn Messing.  Given the size of the tumor and the fact that it is triple negative Dr. Marlou Starks referred the patient to see oncology to discuss about neoadjuvant chemotherapy.  Dr. Marlou Starks also ordered MRI of the breast and the patient has not have it done yet. Patient no showed a few appointment with me.  RN navigator [place] and I have tried multiple times to reach her.RN navigator [place] and I have tried multiple times to reach her.  # Triple negative breast cancer: she declined neoadjuvant chemotherapy and has lumpectomy with sentinel LN biopsy on  11/24/2017.  Pathology showed two separate foci of invasive mammary carcinoma with associated tumor necrosis, DCIS present, fibroadenoma 69m,  Foci 1 is 46m and foci 2 is 351mmargins are negative. Sentinel LN 0/0 involved. Negative LVI Pathology Stage: mpT2 pN0.    INTERVAL HISTORY Cindy Brewer a 46 31o. female who has above history reviewed by me today presents for follow-up visit fortriple negative breast cancer deficiency anemia.   #Triple negative breast cancer, declined neoadjuvant or adjuvant chemotherapy.  She reports doing well.  Denies any bone pain, new lumps or bumps.  # Fatigue: reports worsening fatigue. Chronic onset, perisistent, no aggravating or improving factors, no associated symptoms.  Iron deficiency anemia#, status post IV iron. # Menorrhagia, patient reports that she has had ongoing menstrual period  for a month. #Constipation, reports severe constipation, no aggravating or alleviating factors.  She went to ER on 01/20/2018 complaining abdominal had a CT renal stone scan which did not show any hydronephrosis or obstructing stone.  Possible nonobstructing right renal upper pole calculus. There is moderate colonic stool burden.  No obstruction.  Enlarged myomatous uterus with necrotic changes of the dominant left uterine body fibroid. Also reports that recently she had eye surgery as she had teared retina. .  Review of Systems  Constitutional: Positive for malaise/fatigue. Negative for chills, fever and weight loss.  HENT: Negative for congestion, ear discharge, ear pain, hearing loss, nosebleeds, sinus pain, sore throat and tinnitus.   Eyes: Negative for double vision, photophobia, pain, discharge and redness.  Respiratory: Positive for shortness of breath. Negative for cough,  hemoptysis, sputum production and wheezing.   Cardiovascular: Negative for chest pain, palpitations, orthopnea, claudication and leg swelling.  Gastrointestinal: Negative for  abdominal pain, blood in stool, constipation, diarrhea, heartburn, melena, nausea and vomiting.  Genitourinary: Negative for dysuria, flank pain, frequency, hematuria and urgency.  Musculoskeletal: Negative for back pain, myalgias and neck pain.  Skin: Negative for itching and rash.  Neurological: Negative for dizziness, tingling, tremors, sensory change, focal weakness, weakness and headaches.  Endo/Heme/Allergies: Negative for environmental allergies. Does not bruise/bleed easily.  Psychiatric/Behavioral: Negative for depression, hallucinations and substance abuse. The patient is not nervous/anxious.     MEDICAL HISTORY:  Past Medical History:  Diagnosis Date  . Anemia   . Anxiety   . Cancer (HCC)    BREAST  . Depression   . Fibroid   . GERD (gastroesophageal reflux disease)     SURGICAL HISTORY: Past Surgical History:  Procedure Laterality Date  . BREAST LUMPECTOMY WITH AXILLARY LYMPH NODE BIOPSY Left 11/24/2017   Procedure: LEFT BREAST LUMPECTOMY WITH LEFT SENTINEL NODE LYMPH NODE BIOPSY;  Surgeon: Jovita Kussmaul, MD;  Location: [facility] ORS;  Service: General;  Laterality: Left;  . CESAREAN SECTION    . EYE SURGERY     DETACHED RETINA 4/519    SOCIAL HISTORY: Social History   Socioeconomic History  . Marital status: Divorced    Spouse name: Not on file  . Number of children: Not on file  . Years of education: Not on file  . Highest education level: Not on file  Occupational History  . Not on file  Social Needs  . Financial resource strain: Not on file  . Food insecurity:    Worry: Not on file    Inability: Not on file  . Transportation needs:    Medical: Not on file    Non-medical: Not on file  Tobacco Use  . Smoking status: Current Every Day Smoker  . Smokeless tobacco: Never Used  Substance and Sexual Activity  . Alcohol use: Yes    Comment: weekends  . Drug use: No  . Sexual activity: Not on file  Lifestyle  . Physical activity:    Days per week: 7 days      Minutes per session: 20 min  . Stress: Only a little  Relationships  . Social connections:    Talks on phone: Once a week    Gets together: Once a week    Attends religious service: Never    Active member of club or organization: No    Attends meetings of clubs or organizations: Never    Relationship status: Divorced  . Intimate partner violence:    Fear of current or ex partner: No    Emotionally abused: No    Physically abused: No    Forced sexual activity: No  Other Topics Concern  . Not on file  Social History Narrative   ** Merged History Encounter **        FAMILY HISTORY: Family History  Problem Relation Age of Onset  . Stroke Maternal Grandmother     ALLERGIES:  is allergic to demerol [meperidine]; percocet [oxycodone-acetaminophen]; and sulfa antibiotics.  MEDICATIONS:  Current Outpatient Medications  Medication Sig Dispense Refill  . ALPRAZolam (XANAX) 0.25 MG tablet Take 1 tablet (0.25 mg total) by mouth 2 (two) times daily as needed for anxiety. 30 tablet 0  . cimetidine (TAGAMET HB) 200 MG tablet Take 200 mg by mouth daily as needed (acid reflux).    [name] diphenhydramine-acetaminophen (TYLENOL  PM) 25-500 MG TABS tablet Take 2 tablets by mouth at bedtime as needed (sleep/pain).    [name] HYDROcodone-acetaminophen (NORCO/VICODIN) 5-325 MG tablet Take 1-2 tablets by mouth every 6 (six) hours as needed for moderate pain or severe pain. 15 tablet 0  . ibuprofen (ADVIL,MOTRIN) 600 MG tablet Take 1 tablet by mouth three times daily with meals 15 tablet 0  . Multiple Vitamin (MULTIVITAMIN WITH MINERALS) TABS tablet Take 1 tablet by mouth daily.    . traMADol (ULTRAM) 50 MG tablet Take 1-2 tablets (50-100 mg total) by mouth every 6 (six) hours as needed. 20 tablet 1  . senna-docusate (SENOKOT-S) 8.6-50 MG tablet Take 2 tablets by mouth daily. 30 tablet 0   No current facility-administered medications for this visit.      PHYSICAL EXAMINATION: ECOG PERFORMANCE STATUS: 0  - Asymptomatic Vitals:   01/26/18 1142  BP: 109/73  Pulse: 98  Resp: 18  Temp: 97.7 F (36.5 C)   Filed Weights   01/26/18 1142  Weight: 144 lb 3.2 oz (65.4 kg)    Physical Exam  Constitutional: She is oriented to person, place, and time and well-developed, well-nourished, and in no distress. No distress.  HENT:  Head: Normocephalic and atraumatic.  Nose: Nose normal.  Mouth/Throat: Oropharynx is clear and moist. No oropharyngeal exudate.  Eyes: Pupils are equal, round, and reactive to light. EOM are normal. Left eye exhibits no discharge. No scleral icterus.  Pale conjunctivae  Neck: Normal range of motion. Neck supple. No JVD present.  Cardiovascular: Normal rate, regular rhythm and normal heart sounds.  No murmur heard. Pulmonary/Chest: Effort normal and breath sounds normal. No respiratory distress. She has no wheezes. She has no rales. She exhibits no tenderness.  Abdominal: Soft. Bowel sounds are normal. She exhibits no distension and no mass. There is no tenderness. There is no rebound.  Musculoskeletal: Normal range of motion. She exhibits no edema or tenderness.  Lymphadenopathy:    She has no cervical adenopathy.  Neurological: She is alert and oriented to person, place, and time. No cranial nerve deficit. She exhibits normal muscle tone. Coordination normal.  Skin: Skin is warm and dry. No rash noted. She is not diaphoretic. No erythema.  Psychiatric: Memory, affect and judgment normal.  Breast exam was performed in seated and lying down position. Patient is status post left lumpectomy with a well-healing surgical scar. No evidence of any palpable masses. No evidence of axillary adenopathy. No evidence of any palpable masses or lumps in the right breast. No evidence of right axillary adenopathy      LABORATORY DATA:  I have reviewed the data as listed Lab Results  Component Value Date   WBC 4.4 01/26/2018   HGB 6.7 (L) 01/26/2018   HCT 21.8 (L) 01/26/2018    MCV 75.3 (L) 01/26/2018   PLT 432 01/26/2018   Recent Labs    12/08/17 1210 01/20/18 0248 01/26/18 1044  NA 135 138 136  K 3.9 3.7 4.0  CL 104 109 105  CO2 21* 22 23  GLUCOSE 104* 115* 101*  BUN 10 8 6   CREATININE 0.66 0.82 0.72  CALCIUM 8.9 8.5* 8.8*  GFRNONAA >60 >60 >60  GFRAA >60 >60 >60  PROT 7.9 7.2 7.6  ALBUMIN 3.7 3.5 3.7  AST 19 14* 13*  ALT 7* <5 8  ALKPHOS 46 26* 34*  BILITOT 0.2* 0.3 0.2*       ASSESSMENT & PLAN:  Cancer Staging Malignant neoplasm of overlapping sites of left breast  in female, estrogen receptor negative ([place]) Staging form: Breast, AJCC 8th Edition - Clinical stage from 08/25/2017: Stage IIB (cT2(2), cN0, cM0, G3, ER: Negative, PR: Negative, HER2: Negative) - Signed by Earlie Server, MD on 08/25/2017  1. Thyroid nodule   2. Iron deficiency anemia due to chronic blood loss   3. Malignant neoplasm of overlapping sites of left breast in female, estrogen receptor negative ([place])    # Multifocal triple negative breast cancer clinically Stage IIB (pT2 pN0,cM0) Patient has previously denied chemotherapy. Reports doing well.   CT renal scan 01/20/2018  independently reviewed by me.  No evidence of metastasis. Plan CT chest, however patient reports extremely fatigued due to  symptomatic anemia.  Will order at the next visit.  #Symptomatic anemia, hemoglobin 6.6. Secondary to severe iron deficiency.  Discussed with patient about blood transfusion and patient declined. Iron deficiency anemia due to chronic blood loss. #We will schedule patient for IV Venofer twice a week for 5 doses.  # Thyroid nodule, check Thyroid US.   # uterine Fibroids: likely the reason of iron deficiency.  Recommend patient to establish care with GYN. #Genetic testing, previously discussed with patient she declined. ll questions were answered. The patient knows to call the clinic with any problems questions or concerns.  Return of visit:  4 weeks with repeat labs.    Earlie Server,  MD, PhD Hematology Oncology [facility] Pager- [phone] 01/26/2018

## 2018-01-30 ENCOUNTER — Inpatient Hospital Stay: Payer: Medicaid Other

## 2018-01-30 VITALS — BP 119/77 | HR 88 | Temp 97.7°F | Resp 18

## 2018-01-30 DIAGNOSIS — C50812 Malignant neoplasm of overlapping sites of left female breast: Secondary | ICD-10-CM | POA: Diagnosis not present

## 2018-01-30 DIAGNOSIS — Z171 Estrogen receptor negative status [ER-]: Principal | ICD-10-CM

## 2018-01-30 MED ORDER — IRON SUCROSE 20 MG/ML IV SOLN
200.0000 mg | INTRAVENOUS | Status: DC
  Administered 2018-01-30: 200 mg via INTRAVENOUS
  Filled 2018-01-30: qty 10

## 2018-01-30 MED ORDER — SODIUM CHLORIDE 0.9 % IV SOLN
INTRAVENOUS | Status: DC
  Administered 2018-01-30: 14:00:00 via INTRAVENOUS
  Filled 2018-01-30: qty 1000

## 2018-02-02 ENCOUNTER — Inpatient Hospital Stay: Payer: Medicaid Other | Attending: Oncology

## 2018-02-02 VITALS — BP 111/73 | HR 102 | Temp 98.2°F | Resp 18

## 2018-02-02 DIAGNOSIS — D259 Leiomyoma of uterus, unspecified: Secondary | ICD-10-CM | POA: Diagnosis not present

## 2018-02-02 DIAGNOSIS — D508 Other iron deficiency anemias: Secondary | ICD-10-CM | POA: Diagnosis not present

## 2018-02-02 DIAGNOSIS — Z171 Estrogen receptor negative status [ER-]: Secondary | ICD-10-CM

## 2018-02-02 DIAGNOSIS — E041 Nontoxic single thyroid nodule: Secondary | ICD-10-CM | POA: Diagnosis not present

## 2018-02-02 DIAGNOSIS — Z853 Personal history of malignant neoplasm of breast: Secondary | ICD-10-CM | POA: Insufficient documentation

## 2018-02-02 DIAGNOSIS — N920 Excessive and frequent menstruation with regular cycle: Secondary | ICD-10-CM | POA: Diagnosis not present

## 2018-02-02 DIAGNOSIS — R5383 Other fatigue: Secondary | ICD-10-CM | POA: Insufficient documentation

## 2018-02-02 DIAGNOSIS — C50812 Malignant neoplasm of overlapping sites of left female breast: Secondary | ICD-10-CM

## 2018-02-02 MED ORDER — SODIUM CHLORIDE 0.9 % IV SOLN
Freq: Once | INTRAVENOUS | Status: AC
  Administered 2018-02-02: 14:00:00 via INTRAVENOUS
  Filled 2018-02-02: qty 1000

## 2018-02-02 MED ORDER — IRON SUCROSE 20 MG/ML IV SOLN
200.0000 mg | INTRAVENOUS | Status: DC
  Administered 2018-02-02: 200 mg via INTRAVENOUS
  Filled 2018-02-02: qty 10

## 2018-02-02 NOTE — Progress Notes (Signed)
Pt reports "palpatations" accompanied with SOB with exertion intermittently since last visit. Pt stable and denies any "palpatations" or SOB at this time. Symptoms and VS reviewed with MD. Per Almyra Free CMA per Dr. Vinson Moselle at this time, okay to proceed with treatment.   Pt and VS stable at discharge.

## 2018-02-05 ENCOUNTER — Inpatient Hospital Stay: Payer: Medicaid Other

## 2018-02-09 ENCOUNTER — Inpatient Hospital Stay: Payer: Medicaid Other

## 2018-02-09 VITALS — BP 127/86 | HR 89 | Temp 99.0°F | Resp 18

## 2018-02-09 DIAGNOSIS — C50812 Malignant neoplasm of overlapping sites of left female breast: Secondary | ICD-10-CM

## 2018-02-09 DIAGNOSIS — Z171 Estrogen receptor negative status [ER-]: Principal | ICD-10-CM

## 2018-02-09 DIAGNOSIS — D509 Iron deficiency anemia, unspecified: Secondary | ICD-10-CM

## 2018-02-09 DIAGNOSIS — D508 Other iron deficiency anemias: Secondary | ICD-10-CM | POA: Diagnosis not present

## 2018-02-09 MED ORDER — SODIUM CHLORIDE 0.9 % IV SOLN
INTRAVENOUS | Status: DC
  Administered 2018-02-09: 12:00:00 via INTRAVENOUS
  Filled 2018-02-09: qty 1000

## 2018-02-09 MED ORDER — IRON SUCROSE 20 MG/ML IV SOLN
200.0000 mg | INTRAVENOUS | Status: DC
  Administered 2018-02-09: 200 mg via INTRAVENOUS
  Filled 2018-02-09: qty 10

## 2018-02-13 ENCOUNTER — Emergency Department
Admission: EM | Admit: 2018-02-13 | Discharge: 2018-02-14 | Disposition: A | Discharge status: Home / Self Care | Payer: Medicaid Other | Attending: Emergency Medicine | Admitting: Emergency Medicine

## 2018-02-13 ENCOUNTER — Other Ambulatory Visit: Payer: Self-pay

## 2018-02-13 DIAGNOSIS — F17200 Nicotine dependence, unspecified, uncomplicated: Secondary | ICD-10-CM | POA: Insufficient documentation

## 2018-02-13 DIAGNOSIS — Z853 Personal history of malignant neoplasm of breast: Secondary | ICD-10-CM | POA: Insufficient documentation

## 2018-02-13 DIAGNOSIS — M25461 Effusion, right knee: Secondary | ICD-10-CM | POA: Insufficient documentation

## 2018-02-13 DIAGNOSIS — Z79899 Other long term (current) drug therapy: Secondary | ICD-10-CM | POA: Diagnosis not present

## 2018-02-13 DIAGNOSIS — F419 Anxiety disorder, unspecified: Secondary | ICD-10-CM | POA: Insufficient documentation

## 2018-02-13 DIAGNOSIS — F329 Major depressive disorder, single episode, unspecified: Secondary | ICD-10-CM | POA: Diagnosis not present

## 2018-02-13 DIAGNOSIS — R2241 Localized swelling, mass and lump, right lower limb: Secondary | ICD-10-CM | POA: Diagnosis present

## 2018-02-13 NOTE — ED Triage Notes (Signed)
Pt in with co right knee swelling that started today, no hx of the same. Denies any injury or cause, pt ambulatory to triage.

## 2018-02-14 ENCOUNTER — Emergency Department: Payer: Medicaid Other

## 2018-02-14 NOTE — ED Provider Notes (Signed)
[facility] Emergency Department Provider Note ____________________________________________   First MD Initiated Contact with Patient 02/14/18 [phone]     (approximate)  I have reviewed the triage vital signs and the nursing notes.   HISTORY  Chief Complaint Knee Pain    HPI Cindy Brewer is a 47 y.o. female with PMH as noted below who presents with right knee swelling and pain, acute onset today, atraumatic, and somewhat worse with walking.  She denies any specific injury.  She states that she works at [employer] and is standing up most of the day.  She denies any numbness or weakness.  No pain to the calf or the thigh.  Past Medical History:  Diagnosis Date  . Anemia   . Anxiety   . Cancer (HCC)    BREAST  . Depression   . Fibroid   . GERD (gastroesophageal reflux disease)     Patient Active Problem List   Diagnosis Date Noted  . Malignant neoplasm of overlapping sites of left breast in female, estrogen receptor negative ([place]) 08/25/2017    Past Surgical History:  Procedure Laterality Date  . BREAST LUMPECTOMY WITH AXILLARY LYMPH NODE BIOPSY Left 11/24/2017   Procedure: LEFT BREAST LUMPECTOMY WITH LEFT SENTINEL NODE LYMPH NODE BIOPSY;  Surgeon: Jovita Kussmaul, MD;  Location: [facility] ORS;  Service: General;  Laterality: Left;  . CESAREAN SECTION    . EYE SURGERY     DETACHED RETINA 4/519    Prior to Admission medications   Medication Sig Start Date End Date Taking? Authorizing Provider  ALPRAZolam (XANAX) 0.25 MG tablet Take 1 tablet (0.25 mg total) by mouth 2 (two) times daily as needed for anxiety. 09/05/17   Earlie Server, MD  cimetidine (TAGAMET HB) 200 MG tablet Take 200 mg by mouth daily as needed (acid reflux).    [provider]  diphenhydramine-acetaminophen (TYLENOL PM) 25-500 MG TABS tablet Take 2 tablets by mouth at bedtime as needed (sleep/pain).    [provider]  HYDROcodone-acetaminophen (NORCO/VICODIN) 5-325 MG  tablet Take 1-2 tablets by mouth every 6 (six) hours as needed for moderate pain or severe pain. 11/24/17   Jovita Kussmaul, MD  ibuprofen (ADVIL,MOTRIN) 600 MG tablet Take 1 tablet by mouth three times daily with meals 01/20/18   Hinda Kehr, MD  Multiple Vitamin (MULTIVITAMIN WITH MINERALS) TABS tablet Take 1 tablet by mouth daily.    [provider]  senna-docusate (SENOKOT-S) 8.6-50 MG tablet Take 2 tablets by mouth daily. 01/26/18   Earlie Server, MD  traMADol (ULTRAM) 50 MG tablet Take 1-2 tablets (50-100 mg total) by mouth every 6 (six) hours as needed. 01/20/18   Hinda Kehr, MD    Allergies Demerol [meperidine]; Percocet [oxycodone-acetaminophen]; and Sulfa antibiotics  Family History  Problem Relation Age of Onset  . Stroke Maternal Grandmother     Social History Social History   Tobacco Use  . Smoking status: Current Every Day Smoker  . Smokeless tobacco: Never Used  Substance Use Topics  . Alcohol use: Yes    Comment: weekends  . Drug use: No    Review of Systems  Constitutional: No fever/chills Gastrointestinal: No vomiting. Musculoskeletal: Positive for right knee pain. Skin: Negative for rash. Neurological: Negative for focal weakness or numbness.   ____________________________________________   PHYSICAL EXAM:  VITAL SIGNS: ED Triage Vitals  Enc Vitals Group     BP 02/13/18 2322 (!) 142/96     Pulse Rate 02/13/18 2322 93  Resp 02/13/18 2322 20     Temp 02/13/18 2322 97.6 F (36.4 C)     Temp Source 02/13/18 2322 Oral     SpO2 02/13/18 2322 100 %     Weight 02/13/18 2323 144 lb (65.3 kg)     Height 02/13/18 2323 5\' 6"  (1.676 m)     Head Circumference --      Peak Flow --      Pain Score 02/13/18 2323 5     Pain Loc --      Pain Edu? --      Excl. in [place]? --     Constitutional: Alert and oriented. Well appearing and in no acute distress. Eyes: Conjunctivae are normal.  Head: Atraumatic. Nose: No congestion/rhinnorhea. Mouth/Throat:  Mucous membranes are moist.   Neck: Normal range of motion.  Cardiovascular: Good peripheral circulation. Respiratory: Normal respiratory effort.  Gastrointestinal:  No distention.  Musculoskeletal: Right knee effusion.  Full range of motion with no focal bony tenderness.  No deformity.  No calf, popliteal, or thigh tenderness or swelling. Neurologic:  Normal speech and language. No gross focal neurologic deficits are appreciated.  Skin:  Skin is warm and dry. No rash noted. Psychiatric: Mood and affect are normal. Speech and behavior are normal.  ____________________________________________   LABS (all labs ordered are listed, but only abnormal results are displayed)  Labs Reviewed - No data to display ____________________________________________  EKG   ____________________________________________  RADIOLOGY  XR R knee: No acute fracture.  Findings of possible ligamentous injury.  ____________________________________________   PROCEDURES  Procedure(s) performed: No  Procedures  Critical Care performed: No ____________________________________________   INITIAL IMPRESSION / ASSESSMENT AND PLAN / ED COURSE  Pertinent labs & imaging results that were available during my care of the patient were reviewed by me and considered in my medical decision making (see chart for details).  47 year old female presents with right knee effusion and some mild pain with no known preceding trauma.  Exam is as described above.  The lower extremity is neuro/vascular intact.  Overall presentation is most consistent with occult trauma such as mild meniscus or ligamentous injury.  Will obtain x-ray to rule out bony trauma.  No evidence of DVT.  ----------------------------------------- 2:48 AM on 02/14/2018 -----------------------------------------  X-ray shows no acute fracture.  Patient stable for discharge home.  RICE instructions and return precautions given.  Patient expresses  understanding. ____________________________________________   FINAL CLINICAL IMPRESSION(S) / ED DIAGNOSES  Final diagnoses:  Effusion of right knee      NEW MEDICATIONS STARTED DURING THIS VISIT:  Discharge Medication List as of 02/14/2018  2:02 AM       Note:  This document was prepared using Dragon voice recognition software and may include unintentional dictation errors.     Arta Silence, MD 02/14/18 [phone]

## 2018-02-14 NOTE — Discharge Instructions (Addendum)
Keep the leg elevated, and apply ice or a cold pack.  You should follow-up with an orthopedist within the next several weeks.  A referral has been provided.  Continue to take ibuprofen as needed for pain.  Return to the ER for any new or worsening swelling, pain, difficulty walking, or any other new or worsening symptoms that concern you.

## 2018-02-15 ENCOUNTER — Inpatient Hospital Stay: Payer: Medicaid Other

## 2018-02-15 ENCOUNTER — Other Ambulatory Visit: Payer: Self-pay | Admitting: Oncology

## 2018-02-15 VITALS — BP 130/82 | HR 78 | Temp 96.6°F | Resp 18

## 2018-02-15 DIAGNOSIS — D508 Other iron deficiency anemias: Secondary | ICD-10-CM | POA: Diagnosis not present

## 2018-02-15 DIAGNOSIS — C50812 Malignant neoplasm of overlapping sites of left female breast: Secondary | ICD-10-CM

## 2018-02-15 DIAGNOSIS — Z171 Estrogen receptor negative status [ER-]: Principal | ICD-10-CM

## 2018-02-15 MED ORDER — SODIUM CHLORIDE 0.9 % IV SOLN
Freq: Once | INTRAVENOUS | Status: AC
  Administered 2018-02-15: 12:00:00 via INTRAVENOUS
  Filled 2018-02-15: qty 1000

## 2018-02-15 MED ORDER — IRON SUCROSE 20 MG/ML IV SOLN
200.0000 mg | INTRAVENOUS | Status: DC
  Administered 2018-02-15: 200 mg via INTRAVENOUS
  Filled 2018-02-15: qty 10

## 2018-02-28 ENCOUNTER — Inpatient Hospital Stay: Payer: Medicaid Other

## 2018-02-28 ENCOUNTER — Other Ambulatory Visit: Payer: Self-pay

## 2018-02-28 ENCOUNTER — Encounter: Payer: Self-pay | Admitting: Oncology

## 2018-02-28 ENCOUNTER — Inpatient Hospital Stay (HOSPITAL_BASED_OUTPATIENT_CLINIC_OR_DEPARTMENT_OTHER): Payer: Medicaid Other | Admitting: Oncology

## 2018-02-28 VITALS — BP 145/85 | HR 61 | Resp 18

## 2018-02-28 VITALS — BP 159/102 | HR 74 | Temp 96.2°F | Wt 146.5 lb

## 2018-02-28 DIAGNOSIS — N920 Excessive and frequent menstruation with regular cycle: Secondary | ICD-10-CM | POA: Diagnosis not present

## 2018-02-28 DIAGNOSIS — D259 Leiomyoma of uterus, unspecified: Secondary | ICD-10-CM | POA: Diagnosis not present

## 2018-02-28 DIAGNOSIS — D509 Iron deficiency anemia, unspecified: Secondary | ICD-10-CM

## 2018-02-28 DIAGNOSIS — Z853 Personal history of malignant neoplasm of breast: Secondary | ICD-10-CM

## 2018-02-28 DIAGNOSIS — D508 Other iron deficiency anemias: Secondary | ICD-10-CM | POA: Diagnosis not present

## 2018-02-28 DIAGNOSIS — D500 Iron deficiency anemia secondary to blood loss (chronic): Secondary | ICD-10-CM

## 2018-02-28 DIAGNOSIS — R5383 Other fatigue: Secondary | ICD-10-CM | POA: Diagnosis not present

## 2018-02-28 DIAGNOSIS — N946 Dysmenorrhea, unspecified: Secondary | ICD-10-CM

## 2018-02-28 DIAGNOSIS — C50812 Malignant neoplasm of overlapping sites of left female breast: Secondary | ICD-10-CM

## 2018-02-28 DIAGNOSIS — Z171 Estrogen receptor negative status [ER-]: Secondary | ICD-10-CM

## 2018-02-28 DIAGNOSIS — E041 Nontoxic single thyroid nodule: Secondary | ICD-10-CM

## 2018-02-28 LAB — CBC WITH DIFFERENTIAL/PLATELET
Basophils Absolute: 0 10*3/uL (ref 0–0.1)
Basophils Relative: 1 %
Eosinophils Absolute: 0 10*3/uL (ref 0–0.7)
Eosinophils Relative: 1 %
HEMATOCRIT: 28.9 % — AB (ref 35.0–47.0)
Hemoglobin: 9 g/dL — ABNORMAL LOW (ref 12.0–16.0)
LYMPHS ABS: 1.3 10*3/uL (ref 1.0–3.6)
Lymphocytes Relative: 28 %
MCH: 27.2 pg (ref 26.0–34.0)
MCHC: 31 g/dL — AB (ref 32.0–36.0)
MCV: 87.9 fL (ref 80.0–100.0)
MONO ABS: 0.5 10*3/uL (ref 0.2–0.9)
MONOS PCT: 11 %
NEUTROS ABS: 2.7 10*3/uL (ref 1.4–6.5)
NEUTROS PCT: 59 %
Platelets: 276 10*3/uL (ref 150–440)
RBC: 3.29 MIL/uL — ABNORMAL LOW (ref 3.80–5.20)
RDW: 28.1 % — ABNORMAL HIGH (ref 11.5–14.5)
WBC: 4.5 10*3/uL (ref 3.6–11.0)

## 2018-02-28 LAB — IRON AND TIBC
IRON: 22 ug/dL — AB (ref 28–170)
Saturation Ratios: 8 % — ABNORMAL LOW (ref 10.4–31.8)
TIBC: 292 ug/dL (ref 250–450)
UIBC: 270 ug/dL

## 2018-02-28 LAB — FERRITIN: Ferritin: 46 ng/mL (ref 11–307)

## 2018-02-28 MED ORDER — SODIUM CHLORIDE 0.9 % IV SOLN: 200.0000 mg | INTRAVENOUS | Status: DC

## 2018-02-28 MED ORDER — SODIUM CHLORIDE 0.9 % IV SOLN
Freq: Once | INTRAVENOUS | Status: AC
  Administered 2018-02-28: 11:00:00 via INTRAVENOUS
  Filled 2018-02-28: qty 250

## 2018-02-28 MED ORDER — IRON SUCROSE 20 MG/ML IV SOLN
200.0000 mg | Freq: Once | INTRAVENOUS | Status: AC
  Administered 2018-02-28: 200 mg via INTRAVENOUS
  Filled 2018-02-28: qty 10

## 2018-02-28 NOTE — Progress Notes (Signed)
Patient here today for follow up and venofer.  Patient states no new concerns today

## 2018-02-28 NOTE — Progress Notes (Signed)
Hematology/Oncology Follow up note [facility] [phone] [phone]   Patient Care Team: Patient, No Pcp Per as PCP - General (General Practice) Patient, No Pcp Per (General Practice) REASON FOR VISIT Follow up for treatment of triple negative breast cancer and iron deficiency anemia.     HISTORY OF PRESENTING ILLNESS:  Cindy Brewer is a  47 y.o.  female with PMH listed below who was referred to me for evaluation of newly diagnosed breast cancer. Patient is a 47 year old female who felt a lump in the upper portion of the left breast about 3-4 months ago. Mammogram on August 03, 2017 showed 2 indeterminate hypoechoic masses at the 12:00 and 11:00.  These are the 2 masses corresponding to patient's palpable abnormality.There is also stable probably benign mass at the 2 o'clock position, possibly related to a complicated cyst or fibroadenoma.  #Patient underwent biopsy of the 12:00 and 11:00 mass and up pathology revealed both mass showed invasive ductal carcinoma, pathology commented that the carcinoma in the 2 specimen is somewhat similar and is grade 3.  The breast prognostic profile is performed on part 1, which showed ER PR negative and HER-2/neu negative, ki67 80%  #Patient supposed to have biopsy of the 2:00 mass which she missed the appointment.  Patient was seen and evaluated by surgeon Dr. Autumn Messing.  Given the size of the tumor and the fact that it is triple negative Dr. Marlou Starks referred the patient to see oncology to discuss about neoadjuvant chemotherapy.  Dr. Marlou Starks also ordered MRI of the breast and the patient has not have it done yet. Patient no showed a few appointment with me.  RN navigator [place] and I have tried multiple times to reach her.RN navigator [place] and I have tried multiple times to reach her.  # Triple negative breast cancer: she declined neoadjuvant chemotherapy and has lumpectomy with sentinel LN biopsy on  11/24/2017.  Pathology showed two separate foci of invasive mammary carcinoma with associated tumor necrosis, DCIS present, fibroadenoma 38m,  Foci 1 is 482m and foci 2 is 3580mmargins are negative. Sentinel LN 0/0 involved. Negative LVI Pathology Stage: mpT2 pN0.    INTERVAL HISTORY Cindy Brewer a 46 78o. female who has above history reviewed by me today present for follow-up visit for triple negative breast cancer and iron deficiency anemia.  #Triple negative breast cancer, declined neoadjuvant or adjuvant chemotherapy.  Reports doing well.  Denies any bone pain, new lumps or bumps.    # Fatigue: Improved after IV iron infusion. Iron deficiency anemia#, status post IV iron. # Menorrhagia, she has ongoing heavy menstrual period.  Has not establish care with GYN despite previous referral.     .  Review of Systems  Constitutional: Positive for malaise/fatigue. Negative for chills, fever and weight loss.  HENT: Negative for congestion, ear discharge, ear pain, hearing loss, nosebleeds, sinus pain, sore throat and tinnitus.   Eyes: Negative for double vision, photophobia, pain, discharge and redness.  Respiratory: Negative for cough, hemoptysis, sputum production, shortness of breath and wheezing.   Cardiovascular: Negative for chest pain, palpitations, orthopnea, claudication and leg swelling.  Gastrointestinal: Negative for abdominal pain, blood in stool, constipation, diarrhea, heartburn, melena, nausea and vomiting.  Genitourinary: Negative for dysuria, flank pain, frequency, hematuria and urgency.  Musculoskeletal: Negative for back pain, myalgias and neck pain.  Skin: Negative for itching and rash.  Neurological: Negative for dizziness, tingling, tremors, sensory change, focal weakness, weakness and headaches.  Endo/Heme/Allergies: Negative  for environmental allergies. Does not bruise/bleed easily.  Psychiatric/Behavioral: Negative for depression, hallucinations and  substance abuse. The patient is not nervous/anxious.     MEDICAL HISTORY:  Past Medical History:  Diagnosis Date  . Anemia   . Anxiety   . Cancer (HCC)    BREAST  . Depression   . Fibroid   . GERD (gastroesophageal reflux disease)     SURGICAL HISTORY: Past Surgical History:  Procedure Laterality Date  . BREAST LUMPECTOMY WITH AXILLARY LYMPH NODE BIOPSY Left 11/24/2017   Procedure: LEFT BREAST LUMPECTOMY WITH LEFT SENTINEL NODE LYMPH NODE BIOPSY;  Surgeon: Jovita Kussmaul, MD;  Location: [facility] ORS;  Service: General;  Laterality: Left;  . CESAREAN SECTION    . EYE SURGERY     DETACHED RETINA 4/519    SOCIAL HISTORY: Social History   Socioeconomic History  . Marital status: Divorced    Spouse name: Not on file  . Number of children: Not on file  . Years of education: Not on file  . Highest education level: Not on file  Occupational History  . Not on file  Social Needs  . Financial resource strain: Not on file  . Food insecurity:    Worry: Not on file    Inability: Not on file  . Transportation needs:    Medical: Not on file    Non-medical: Not on file  Tobacco Use  . Smoking status: Current Every Day Smoker  . Smokeless tobacco: Never Used  Substance and Sexual Activity  . Alcohol use: Yes    Comment: weekends  . Drug use: No  . Sexual activity: Not on file  Lifestyle  . Physical activity:    Days per week: 7 days    Minutes per session: 20 min  . Stress: Only a little  Relationships  . Social connections:    Talks on phone: Once a week    Gets together: Once a week    Attends religious service: Never    Active member of club or organization: No    Attends meetings of clubs or organizations: Never    Relationship status: Divorced  . Intimate partner violence:    Fear of current or ex partner: No    Emotionally abused: No    Physically abused: No    Forced sexual activity: No  Other Topics Concern  . Not on file  Social History Narrative   ** Merged  History Encounter **        FAMILY HISTORY: Family History  Problem Relation Age of Onset  . Stroke Maternal Grandmother     ALLERGIES:  is allergic to demerol [meperidine]; percocet [oxycodone-acetaminophen]; and sulfa antibiotics.  MEDICATIONS:  Current Outpatient Medications  Medication Sig Dispense Refill  . ALPRAZolam (XANAX) 0.25 MG tablet Take 1 tablet (0.25 mg total) by mouth 2 (two) times daily as needed for anxiety. 30 tablet 0  . cimetidine (TAGAMET HB) 200 MG tablet Take 200 mg by mouth daily as needed (acid reflux).    [name] diphenhydramine-acetaminophen (TYLENOL PM) 25-500 MG TABS tablet Take 2 tablets by mouth at bedtime as needed (sleep/pain).    [name] HYDROcodone-acetaminophen (NORCO/VICODIN) 5-325 MG tablet Take 1-2 tablets by mouth every 6 (six) hours as needed for moderate pain or severe pain. 15 tablet 0  . ibuprofen (ADVIL,MOTRIN) 600 MG tablet Take 1 tablet by mouth three times daily with meals 15 tablet 0  . Multiple Vitamin (MULTIVITAMIN WITH MINERALS) TABS tablet Take 1 tablet by mouth daily.    [name]  senna-docusate (SENOKOT-S) 8.6-50 MG tablet Take 2 tablets by mouth daily. 30 tablet 0  . traMADol (ULTRAM) 50 MG tablet Take 1-2 tablets (50-100 mg total) by mouth every 6 (six) hours as needed. 20 tablet 1   No current facility-administered medications for this visit.      PHYSICAL EXAMINATION: ECOG PERFORMANCE STATUS: 0 - Asymptomatic Vitals:   02/28/18 1035  BP: (!) 159/102  Pulse: 74  Temp: (!) 96.2 F (35.7 C)   Filed Weights   02/28/18 1035  Weight: 146 lb 8 oz (66.5 kg)    Physical Exam  Constitutional: She is oriented to person, place, and time and well-developed, well-nourished, and in no distress. No distress.  HENT:  Head: Normocephalic and atraumatic.  Nose: Nose normal.  Mouth/Throat: Oropharynx is clear and moist. No oropharyngeal exudate.  Eyes: Pupils are equal, round, and reactive to light. EOM are normal. Left eye exhibits no discharge. No  scleral icterus.  Neck: Normal range of motion. Neck supple. No JVD present.  Cardiovascular: Normal rate, regular rhythm and normal heart sounds.  No murmur heard. Pulmonary/Chest: Effort normal and breath sounds normal. No respiratory distress. She has no wheezes. She has no rales. She exhibits no tenderness.  Abdominal: Soft. Bowel sounds are normal. She exhibits no distension and no mass. There is no tenderness. There is no rebound.  Musculoskeletal: Normal range of motion. She exhibits no edema or tenderness.  Lymphadenopathy:    She has no cervical adenopathy.  Neurological: She is alert and oriented to person, place, and time. No cranial nerve deficit. She exhibits normal muscle tone. Coordination normal.  Skin: Skin is warm and dry. No rash noted. She is not diaphoretic. No erythema.  Psychiatric: Memory, affect and judgment normal.  Breast exam was performed in seated and lying down position. Patient is status post left lumpectomy with a well-healing surgical scar. No evidence of any palpable masses. No evidence of axillary adenopathy. No evidence of any palpable masses or lumps in the right breast. No evidence of right axillary adenopathy       LABORATORY DATA:  I have reviewed the data as listed Lab Results  Component Value Date   WBC 4.5 02/28/2018   HGB 9.0 (L) 02/28/2018   HCT 28.9 (L) 02/28/2018   MCV 87.9 02/28/2018   PLT 276 02/28/2018   Recent Labs    12/08/17 1210 01/20/18 0248 01/26/18 1044  NA 135 138 136  K 3.9 3.7 4.0  CL 104 109 105  CO2 21* 22 23  GLUCOSE 104* 115* 101*  BUN _0 CREATININE 0.66 0.82 0.72  CALCIUM 8.9 8.5* 8.8*  GFRNONAA >60 >60 >60  GFRAA >60 >60 >60  PROT 7.9 7.2 7.6  ALBUMIN 3.7 3.5 3.7  AST 19 14* 13*  ALT 7* <5 8  ALKPHOS 46 26* 34*  BILITOT 0.2* 0.3 0.2*    CT renal scan 01/20/2018  independently reviewed by me.  No evidence of metastasis.    ASSESSMENT & PLAN:  Cancer Staging Malignant neoplasm of overlapping  sites of left breast in female, estrogen receptor negative ([place]) Staging form: Breast, AJCC 8th Edition - Clinical stage from 08/25/2017: Stage IIB (cT2(2), cN0, cM0, G3, ER: Negative, PR: Negative, HER2: Negative) - Signed by Earlie Server, MD on 08/25/2017  1. Iron deficiency anemia, unspecified iron deficiency anemia type   2. Malignant neoplasm of overlapping sites of left breast in female, estrogen receptor negative ([place])   3. Menorrhalgia    # Multifocal  triple negative breast cancer clinically Stage IIB (pT2 pN0,cM0) Declined chemotherapy previously.  Reports doing well. Reports doing well.   Patient reports still feeling fatigue.  Was planning to order CT scan, she is not interested at this point.  Will order at next visit.  #Iron deficiency anemia, patient has received IV iron during the interval.  Iron panel reviewed.  Improved.  Saturation ratio 8.  We will proceed with IV Venofer today.  Patient has ongoing blood loss from menorrhagia.  Previously discussed with her and refer to GYN.  Patient has not established care yet Referred to [place] to establish care   # Thyroid nodule, previously ordered thyroid ultrasound not done.  # uterine Fibroids: likely the reason of iron deficiency.  GYN referral.    #Genetic testing, previously discussed with patient she declined. ll questions were answered. The patient knows to call the clinic with any problems questions or concerns.  Return of visit:  4 weeks with repeat labs.    Earlie Server, MD, PhD Hematology Oncology [facility] at [facility] Pager- [phone] 02/28/2018

## 2018-03-08 ENCOUNTER — Encounter: Payer: Self-pay | Admitting: Obstetrics and Gynecology

## 2018-03-08 ENCOUNTER — Other Ambulatory Visit (HOSPITAL_COMMUNITY)
Admission: RE | Admit: 2018-03-08 | Discharge: 2018-03-08 | Disposition: A | Discharge status: Home / Self Care | Payer: Medicaid Other | Source: Ambulatory Visit | Attending: Obstetrics and Gynecology | Admitting: Obstetrics and Gynecology

## 2018-03-08 ENCOUNTER — Ambulatory Visit (INDEPENDENT_AMBULATORY_CARE_PROVIDER_SITE_OTHER): Payer: Medicaid Other | Admitting: Obstetrics and Gynecology

## 2018-03-08 VITALS — BP 118/78 | Ht 66.0 in | Wt 150.0 lb

## 2018-03-08 DIAGNOSIS — F419 Anxiety disorder, unspecified: Secondary | ICD-10-CM | POA: Diagnosis not present

## 2018-03-08 DIAGNOSIS — N921 Excessive and frequent menstruation with irregular cycle: Secondary | ICD-10-CM

## 2018-03-08 DIAGNOSIS — D259 Leiomyoma of uterus, unspecified: Secondary | ICD-10-CM | POA: Diagnosis not present

## 2018-03-08 DIAGNOSIS — Z124 Encounter for screening for malignant neoplasm of cervix: Secondary | ICD-10-CM | POA: Insufficient documentation

## 2018-03-08 DIAGNOSIS — Z113 Encounter for screening for infections with a predominantly sexual mode of transmission: Secondary | ICD-10-CM

## 2018-03-08 DIAGNOSIS — Z79899 Other long term (current) drug therapy: Secondary | ICD-10-CM | POA: Insufficient documentation

## 2018-03-08 DIAGNOSIS — D500 Iron deficiency anemia secondary to blood loss (chronic): Secondary | ICD-10-CM

## 2018-03-08 DIAGNOSIS — K219 Gastro-esophageal reflux disease without esophagitis: Secondary | ICD-10-CM | POA: Insufficient documentation

## 2018-03-08 DIAGNOSIS — F17200 Nicotine dependence, unspecified, uncomplicated: Secondary | ICD-10-CM | POA: Insufficient documentation

## 2018-03-08 LAB — POCT WET PREP WITH KOH
Clue Cells Wet Prep HPF POC: NEGATIVE
KOH PREP POC: NEGATIVE
PH, VAGINAL: 4.5
Trichomonas, UA: NEGATIVE

## 2018-03-08 NOTE — Progress Notes (Signed)
Obstetrics & Gynecology Office Visit   Chief Complaint  Patient presents with  . Fibroids  . Referral  Referral from Dr. Earlie Server, MD, from the [facility] for menorrhagia, anemia, and fibroid uterus  History of Present Illness: 47 y.o. [phone] female who is seen in referral from Dr. Tasia Catchings, as above.  She describes her periods as a "crime scene every month."  She has to change a pad every 20-30 minutes.  She also has to change clothes.  This has been going on for about five years.  She has been evaluated by another gynecologist who wanted to do a hysterectomy.  Her gynecologist was in [facility]   She did not want a hysterectomy.  She has been on birth control to control her periods (she states she has been on lo loestrin).  This did not help. She did not try any other method.  She has never had to have a blood transfusion. However, she is receiving iron infusions for her low iron levels and anemia.   Last pelvic ultrasound in 11/2016: Per report (through [facility]): Enlarged uterus with multiple fibroids (largest measuring 6.1 cm).  Endometrial stripe was 3.4 mm.  Right ovary with 6.9 mm echogenic area.  Left ovary appeared normal.    Her periods come every 21-24 days, lasting variable amounts of time.  They can last from 3 days to 1 month.  She passes large clots.  This has been present for about 5 years.  She has to get an iron infusion every 3 weeks.   She was diagnosed with left breast triple negative breast cancer. She has refused neoadjuvant and adjuvant chemotherapy. She has had a lumpectomy.  She has also not had radiation therapy.      She has a history of trichomonas, diagnosed about two years ago.  She took treatment, but her partner did not. She has an abnormal discharge today. She has some vaginal irritation symptoms.   Her last pap smear was in February this year and she states it was normal.  [facility]    Past Medical History:    Diagnosis Date  . Anemia   . Anxiety   . Cancer (HCC)    BREAST  . Depression   . Fibroid   . GERD (gastroesophageal reflux disease)    Past Surgical History:  Procedure Laterality Date  . BREAST LUMPECTOMY WITH AXILLARY LYMPH NODE BIOPSY Left 11/24/2017   Procedure: LEFT BREAST LUMPECTOMY WITH LEFT SENTINEL NODE LYMPH NODE BIOPSY;  Surgeon: Jovita Kussmaul, MD;  Location: [facility] ORS;  Service: General;  Laterality: Left;  . CESAREAN SECTION    . EYE SURGERY     DETACHED RETINA 4/519    Gynecologic History: Patient's last menstrual period was 02/13/2018.  Obstetric History: [id number], s/p SVD x 4 and c-section x 1 with her last (due to placenta previa).  Family History  Problem Relation Age of Onset  . Stroke Maternal Grandmother     Social History   Socioeconomic History  . Marital status: Divorced    Spouse name: Not on file  . Number of children: Not on file  . Years of education: Not on file  . Highest education level: Not on file  Occupational History  . Not on file  Social Needs  . Financial resource strain: Not on file  . Food insecurity:    Worry: Not on file    Inability: Not on file  .  Transportation needs:    Medical: Not on file    Non-medical: Not on file  Tobacco Use  . Smoking status: Current Every Day Smoker  . Smokeless tobacco: Never Used  Substance and Sexual Activity  . Alcohol use: Yes    Comment: weekends  . Drug use: No  . Sexual activity: Yes    Birth control/protection: None  Lifestyle  . Physical activity:    Days per week: 7 days    Minutes per session: 20 min  . Stress: Only a little  Relationships  . Social connections:    Talks on phone: Once a week    Gets together: Once a week    Attends religious service: Never    Active member of club or organization: No    Attends meetings of clubs or organizations: Never    Relationship status: Divorced  . Intimate partner violence:    Fear of current or ex partner: No    Emotionally  abused: No    Physically abused: No    Forced sexual activity: No  Other Topics Concern  . Not on file  Social History Narrative   ** Merged History Encounter **       Allergies  Allergen Reactions  . Demerol [Meperidine] Anaphylaxis    Pt does not tolerate pain killers.  [name] Percocet [Oxycodone-Acetaminophen] Anaphylaxis  . Sulfa Antibiotics Anaphylaxis   Prior to Admission medications   Medication Sig Start Date End Date Taking? Authorizing Provider  ALPRAZolam (XANAX) 0.25 MG tablet Take 1 tablet (0.25 mg total) by mouth 2 (two) times daily as needed for anxiety. 09/05/17  Yes Earlie Server, MD  cimetidine (TAGAMET HB) 200 MG tablet Take 200 mg by mouth daily as needed (acid reflux).   Yes [provider]  diphenhydramine-acetaminophen (TYLENOL PM) 25-500 MG TABS tablet Take 2 tablets by mouth at bedtime as needed (sleep/pain).   Yes [provider]  HYDROcodone-acetaminophen (NORCO/VICODIN) 5-325 MG tablet Take 1-2 tablets by mouth every 6 (six) hours as needed for moderate pain or severe pain. 11/24/17  Yes Jovita Kussmaul, MD  ibuprofen (ADVIL,MOTRIN) 600 MG tablet Take 1 tablet by mouth three times daily with meals 01/20/18  Yes Hinda Kehr, MD  Multiple Vitamin (MULTIVITAMIN WITH MINERALS) TABS tablet Take 1 tablet by mouth daily.   Yes [provider]  senna-docusate (SENOKOT-S) 8.6-50 MG tablet Take 2 tablets by mouth daily. 01/26/18  Yes Earlie Server, MD  traMADol (ULTRAM) 50 MG tablet Take 1-2 tablets (50-100 mg total) by mouth every 6 (six) hours as needed. 01/20/18  Yes Hinda Kehr, MD   Review of Systems  Constitutional: Negative.   HENT: Negative.   Eyes: Negative.   Respiratory: Negative.   Cardiovascular: Negative.   Gastrointestinal: Negative.   Genitourinary: Negative.   Musculoskeletal: Negative.   Skin: Negative.   Neurological: Negative.   Psychiatric/Behavioral: Negative.      Physical Exam BP 118/78   Ht 5\' 6"  (1.676 m)   Wt 150 lb (68  kg)   LMP 02/13/2018   BMI 24.21 kg/m  Patient's last menstrual period was 02/13/2018. Physical Exam  Constitutional: She is oriented to person, place, and time. She appears well-developed and well-nourished. No distress.  Genitourinary: Pelvic exam was performed with patient supine. There is no rash, tenderness or lesion on the right labia. There is no rash, tenderness or lesion on the left labia. Vagina exhibits no lesion. No erythema or bleeding in the vagina. Vaginal discharge (milky and white) found.  Cervix does not exhibit motion tenderness, lesion or polyp.  Genitourinary Comments: Bimanual limited by size of uterus.  On bimanual uterus extends to nearly level of umbilicus on left with abnormal contour sloping downward toward the pelvis on the right. Size of uterus still increased on right. Posterior fullness, but uterus mobile.   HENT:  Head: Normocephalic and atraumatic.  Eyes: EOM are normal. No scleral icterus.  Neck: Normal range of motion. Neck supple. No thyromegaly present.  Cardiovascular: Normal rate and regular rhythm.  Pulmonary/Chest: Effort normal and breath sounds normal. No respiratory distress. She has no wheezes. She has no rales.  Abdominal: Soft. Bowel sounds are normal. She exhibits no distension and no mass. There is no tenderness. There is no rebound and no guarding.  Musculoskeletal: Normal range of motion. She exhibits no edema.  Lymphadenopathy:    She has no cervical adenopathy.  Neurological: She is alert and oriented to person, place, and time. No cranial nerve deficit.  Skin: Skin is warm and dry. No erythema.  Psychiatric: She has a normal mood and affect. Her behavior is normal. Judgment normal.   Female chaperone present for pelvic and breast  portions of the physical exam  Wet Prep: PH: 4.5 Clue Cells: Negative Fungal elements: Negative Trichomonas: Negative  Assessment: 47 y.o. [id number] female here for  1. Menorrhagia with irregular cycle   2.  Iron deficiency anemia due to chronic blood loss   3. Uterine leiomyoma, unspecified location   4. Pap smear for cervical cancer screening   5. Screen for STD (sexually transmitted disease)      Plan: Problem List Items Addressed This Visit      Genitourinary   Fibroid uterus   Relevant Orders   US PELVIS TRANSVANGINAL NON-OB (TV ONLY)   US PELVIS (TRANSABDOMINAL ONLY)     Other   Iron deficiency anemia   Relevant Orders   US PELVIS TRANSVANGINAL NON-OB (TV ONLY)   US PELVIS (TRANSABDOMINAL ONLY)   Menorrhagia with irregular cycle - Primary   Relevant Orders   US PELVIS TRANSVANGINAL NON-OB (TV ONLY)   US PELVIS (TRANSABDOMINAL ONLY)    Other Visit Diagnoses    Pap smear for cervical cancer screening       Relevant Orders   Cytology - PAP   Screen for STD (sexually transmitted disease)       Relevant Orders   Cervicovaginal ancillary only   POCT Wet Prep with KOH (Completed)     Long discussion regarding treatment options.  Options include, do nothing at this time, medication management with IUD or progesterone-only hormonal medication (I do not recommend estrogen therapy given her age, smoking status, and cancer status), surgery with hysterectomy (could likely accomplished laparoscopically robotically), uterine artery embolization.  She was provided information on [place] and hysterectomy. Will get pelvic ultrasound. Will also consider endometrial biopsy at next visit.  Pap smear and STD NAAT obtained today.    30 minutes spent in face to face discussion with > 50% spent in counseling,management, and coordination of care of her menorrhagia with irregular cycle, dysmenorrhea, fibroid uterus, and anemia.   Return in about 1 day (around 03/09/2018) for Pelvic u/s and follow up Dr. Glennon Mac as soon as opening available.   [name], MD 03/08/2018 5:36 PM     CC: Earlie Server, MD [address] [place], [place] [address]

## 2018-03-09 LAB — CERVICOVAGINAL ANCILLARY ONLY
CHLAMYDIA, DNA PROBE: NEGATIVE
NEISSERIA GONORRHEA: NEGATIVE
Trichomonas: NEGATIVE

## 2018-03-12 LAB — CYTOLOGY - PAP
DIAGNOSIS: NEGATIVE
HPV (WINDOPATH): DETECTED — AB
HPV 16/18/45 genotyping: NEGATIVE

## 2018-03-14 ENCOUNTER — Ambulatory Visit (INDEPENDENT_AMBULATORY_CARE_PROVIDER_SITE_OTHER): Payer: Medicaid Other | Admitting: Obstetrics and Gynecology

## 2018-03-14 ENCOUNTER — Ambulatory Visit (INDEPENDENT_AMBULATORY_CARE_PROVIDER_SITE_OTHER): Payer: Medicaid Other

## 2018-03-14 ENCOUNTER — Other Ambulatory Visit (HOSPITAL_COMMUNITY)
Admission: RE | Admit: 2018-03-14 | Discharge: 2018-03-14 | Disposition: A | Discharge status: Home / Self Care | Payer: Medicaid Other | Source: Ambulatory Visit | Attending: Obstetrics and Gynecology | Admitting: Obstetrics and Gynecology

## 2018-03-14 VITALS — BP 128/84 | Ht 66.0 in | Wt 147.0 lb

## 2018-03-14 DIAGNOSIS — D251 Intramural leiomyoma of uterus: Secondary | ICD-10-CM | POA: Diagnosis not present

## 2018-03-14 DIAGNOSIS — N921 Excessive and frequent menstruation with irregular cycle: Secondary | ICD-10-CM

## 2018-03-14 DIAGNOSIS — D500 Iron deficiency anemia secondary to blood loss (chronic): Secondary | ICD-10-CM | POA: Diagnosis not present

## 2018-03-14 DIAGNOSIS — D259 Leiomyoma of uterus, unspecified: Secondary | ICD-10-CM | POA: Diagnosis not present

## 2018-03-14 MED ORDER — MEDROXYPROGESTERONE ACETATE 10 MG PO TABS: 10.0000 mg | ORAL_TABLET | Freq: Three times a day (TID) | ORAL | 4 refills | Status: DC | PRN

## 2018-03-14 NOTE — Progress Notes (Signed)
Gynecology Ultrasound Follow Up   Chief Complaint  Patient presents with  . Follow-up  Menorrhagia, anemia, and fibroids  History of Present Illness: Patient is a 47 y.o. female who presents today for ultrasound evaluation of menorrhagia, anemia, and fibroids.  Ultrasound demonstrates the following findings Adnexa: right ovary appears normal, left ovary not seen. Uterus: retroverted with endometrial stripe  13.9 mm Additional: Multiple fibroids, see final report below.   Having some bleeding today. Pap smear from last visit with NILM cytology and +HPV (negative for HPV 16 and 18).  STD testing negative. She has not yet had an endometrial biopsy.   Past Medical History:  Diagnosis Date  . Anemia   . Anxiety   . Cancer (HCC)    BREAST  . Depression   . Fibroid   . GERD (gastroesophageal reflux disease)     Past Surgical History:  Procedure Laterality Date  . BREAST LUMPECTOMY WITH AXILLARY LYMPH NODE BIOPSY Left 11/24/2017   Procedure: LEFT BREAST LUMPECTOMY WITH LEFT SENTINEL NODE LYMPH NODE BIOPSY;  Surgeon: Jovita Kussmaul, MD;  Location: [facility] ORS;  Service: General;  Laterality: Left;  . CESAREAN SECTION    . EYE SURGERY     DETACHED RETINA 4/519    Family History  Problem Relation Age of Onset  . Stroke Maternal Grandmother     Social History   Socioeconomic History  . Marital status: Divorced    Spouse name: Not on file  . Number of children: Not on file  . Years of education: Not on file  . Highest education level: Not on file  Occupational History  . Not on file  Social Needs  . Financial resource strain: Not on file  . Food insecurity:    Worry: Not on file    Inability: Not on file  . Transportation needs:    Medical: Not on file    Non-medical: Not on file  Tobacco Use  . Smoking status: Current Every Day Smoker  . Smokeless tobacco: Never Used  Substance and Sexual Activity  . Alcohol use: Yes    Comment: weekends  . Drug use: No  .  Sexual activity: Yes    Birth control/protection: None  Lifestyle  . Physical activity:    Days per week: 7 days    Minutes per session: 20 min  . Stress: Only a little  Relationships  . Social connections:    Talks on phone: Once a week    Gets together: Once a week    Attends religious service: Never    Active member of club or organization: No    Attends meetings of clubs or organizations: Never    Relationship status: Divorced  . Intimate partner violence:    Fear of current or ex partner: No    Emotionally abused: No    Physically abused: No    Forced sexual activity: No  Other Topics Concern  . Not on file  Social History Narrative   ** Merged History Encounter **        Allergies  Allergen Reactions  . Demerol [Meperidine] Anaphylaxis    Pt does not tolerate pain killers.  [name] Percocet [Oxycodone-Acetaminophen] Anaphylaxis  . Sulfa Antibiotics Anaphylaxis    Prior to Admission medications   Medication Sig Start Date End Date Taking? Authorizing Provider  ALPRAZolam (XANAX) 0.25 MG tablet Take 1 tablet (0.25 mg total) by mouth 2 (two) times daily as needed for anxiety. 09/05/17   Earlie Server, MD  cimetidine (TAGAMET HB) 200 MG tablet Take 200 mg by mouth daily as needed (acid reflux).    [provider]  diphenhydramine-acetaminophen (TYLENOL PM) 25-500 MG TABS tablet Take 2 tablets by mouth at bedtime as needed (sleep/pain).    [provider]  HYDROcodone-acetaminophen (NORCO/VICODIN) 5-325 MG tablet Take 1-2 tablets by mouth every 6 (six) hours as needed for moderate pain or severe pain. 11/24/17   Jovita Kussmaul, MD  ibuprofen (ADVIL,MOTRIN) 600 MG tablet Take 1 tablet by mouth three times daily with meals 01/20/18   Hinda Kehr, MD  Multiple Vitamin (MULTIVITAMIN WITH MINERALS) TABS tablet Take 1 tablet by mouth daily.    [provider]  senna-docusate (SENOKOT-S) 8.6-50 MG tablet Take 2 tablets by mouth daily. 01/26/18   Earlie Server, MD    traMADol (ULTRAM) 50 MG tablet Take 1-2 tablets (50-100 mg total) by mouth every 6 (six) hours as needed. 01/20/18   Hinda Kehr, MD    Physical Exam BP 128/84   Ht 5\' 6"  (1.676 m)   Wt 147 lb (66.7 kg)   LMP 02/13/2018   BMI 23.73 kg/m    Physical Exam  Constitutional: She is oriented to person, place, and time. She appears well-developed and well-nourished. No distress.  Eyes: Conjunctivae are normal. No scleral icterus.  Genitourinary: Vagina normal. Pelvic exam was performed with patient supine. There is no rash, tenderness or lesion on the right labia. There is no rash, tenderness or lesion on the left labia. Cervix exhibits no motion tenderness. Right adnexum displays no mass, no tenderness and no fullness. Left adnexum displays no mass, no tenderness and no fullness.  Neurological: She is alert and oriented to person, place, and time. No cranial nerve deficit.  Skin: Skin is warm and dry.  Psychiatric: She has a normal mood and affect. Her behavior is normal. Judgment normal.   Endometrial Biopsy After discussion with the patient regarding her abnormal uterine bleeding I recommended that she proceed with an endometrial biopsy for further diagnosis. The risks, benefits, alternatives, and indications for an endometrial biopsy were discussed with the patient in detail. She understood the risks including infection, bleeding, cervical laceration and uterine perforation.  Verbal consent was obtained.   PROCEDURE NOTE:  Pipelle endometrial biopsy was performed using aseptic technique with iodine preparation.  The uterus was sounded to a length of 8 cm.  Adequate sampling was obtained with minimal blood loss.  The patient tolerated the procedure well.    Imaging Report: US Pelvis (transabdominal Only)  Result Date: 03/14/2018 Patient Name: Cindy Brewer DOB: 10/11/1970 MRN: [id number] ULTRASOUND REPORT Location: [facility] Date of Service: 03/14/2018 Indications:uterine  fibroids with heavy bleeding Findings: The uterus is retroverted and measures 12.18 x 10.61 cm x 11.2 cm. Echo texture is heterogenous with evidence of focal masses. Within the uterus are multiple suspected fibroids measuring: Fibroid 1:Intramural vs Subserosal, Left posterior uterine wall = 6.46 x 5.44 cm Fibroid 2:Intramural vs Subserosal, Right anterior uterine wall = 3.51 x 2.33 cm Fibroid 3:Intramural vs Subserosal, Right posterior uterine wall = 3.4 x 3.9 cm Fibroid 4: Intramural vs Submucosal, posterior uterine wall = 4.05 x 3.7 cm The Endometrium measures 13.87 mm. Right Ovary measures 4.8 x 4.8 x3.7 cm. It is normal in appearance, with 20 x 17 mm follicle Left Ovary not seen Survey of the adnexa demonstrates no adnexal masses. There is  free fluid in the post cul de sac. = 2.08 x 3.09 cm Impression: 1. Within the uterus are  multiple suspected fibroids measuring: Fibroid 1:Intramural vs Subserosal, Left posterior ut wall = 6.46 x 5.44 cm Fibroid 2:Intramural vs Subserosal, Right anterior ut wall = 3.51 x 2.33 cm Fibroid 3:Intramural vs Subserosal, Right posterior uterine wall = 3.4 x 3.9 cm Fibroid 4: Intramural vs Submucosal, posterior ut wall = 4.05 x 3.7 cm 2.  free fluid in the post cul de sac. = 2.08 x 3.09 cm Abeer Alsammarraie, RDMS The ultrasound images and findings were reviewed by me and I agree with the above report. [name], MD, [facility], [facility] 03/14/2018 12:09 PM     Assessment: 47 y.o. [id number]  1. Intramural leiomyoma of uterus   2. Menorrhagia with irregular cycle   3. Iron deficiency anemia due to chronic blood loss      Plan: Problem List Items Addressed This Visit      Genitourinary   Fibroid uterus - Primary   Relevant Medications   medroxyPROGESTERone (PROVERA) 10 MG tablet   Other Relevant Orders   Surgical pathology     Other   Iron deficiency anemia   Relevant Medications   medroxyPROGESTERone (PROVERA) 10 MG tablet   Other  Relevant Orders   Surgical pathology   Menorrhagia with irregular cycle   Relevant Medications   medroxyPROGESTERone (PROVERA) 10 MG tablet   Other Relevant Orders   Surgical pathology      Endometrial biopsy performed today.  New prescription medication given to help control symptoms while more data is collected to determine etiology and best choice of treatment.  Patient has expressed interest in past in uterine artery embolization. Discussed treatment options in detail in light of her ultrasound today.  However, there may be some limitations pending her biosy result.   15 minutes spent in face to face discussion with > 50% spent in counseling,management, and coordination of care of her menorrhagia, anemia, and fibroid uterus.  [name], MD, [facility], [facility] 03/15/2018 12:00 PM

## 2018-03-15 ENCOUNTER — Encounter: Payer: Self-pay | Admitting: Obstetrics and Gynecology

## 2018-03-19 ENCOUNTER — Other Ambulatory Visit: Payer: Self-pay | Admitting: Internal Medicine

## 2018-03-19 ENCOUNTER — Ambulatory Visit
Admission: RE | Admit: 2018-03-19 | Discharge: 2018-03-19 | Disposition: A | Discharge status: Home / Self Care | Payer: Disability Insurance | Source: Ambulatory Visit | Attending: Internal Medicine | Admitting: Internal Medicine

## 2018-03-19 DIAGNOSIS — M1990 Unspecified osteoarthritis, unspecified site: Secondary | ICD-10-CM | POA: Diagnosis not present

## 2018-03-19 DIAGNOSIS — M419 Scoliosis, unspecified: Secondary | ICD-10-CM | POA: Insufficient documentation

## 2018-03-19 DIAGNOSIS — R937 Abnormal findings on diagnostic imaging of other parts of musculoskeletal system: Secondary | ICD-10-CM | POA: Insufficient documentation

## 2018-03-28 ENCOUNTER — Other Ambulatory Visit: Payer: Self-pay

## 2018-03-28 ENCOUNTER — Inpatient Hospital Stay: Payer: Medicaid Other | Attending: Oncology | Admitting: Oncology

## 2018-03-28 ENCOUNTER — Inpatient Hospital Stay: Payer: Medicaid Other

## 2018-03-28 ENCOUNTER — Telehealth: Payer: Self-pay | Admitting: Obstetrics and Gynecology

## 2018-03-28 ENCOUNTER — Encounter: Payer: Self-pay | Admitting: Oncology

## 2018-03-28 VITALS — BP 134/83 | HR 83 | Temp 98.3°F | Resp 18 | Wt 147.4 lb

## 2018-03-28 VITALS — BP 142/82 | HR 82 | Temp 98.4°F | Resp 18

## 2018-03-28 DIAGNOSIS — R5383 Other fatigue: Secondary | ICD-10-CM

## 2018-03-28 DIAGNOSIS — C50812 Malignant neoplasm of overlapping sites of left female breast: Secondary | ICD-10-CM

## 2018-03-28 DIAGNOSIS — Z171 Estrogen receptor negative status [ER-]: Secondary | ICD-10-CM

## 2018-03-28 DIAGNOSIS — N946 Dysmenorrhea, unspecified: Secondary | ICD-10-CM

## 2018-03-28 DIAGNOSIS — D500 Iron deficiency anemia secondary to blood loss (chronic): Secondary | ICD-10-CM

## 2018-03-28 DIAGNOSIS — Z853 Personal history of malignant neoplasm of breast: Secondary | ICD-10-CM

## 2018-03-28 DIAGNOSIS — E041 Nontoxic single thyroid nodule: Secondary | ICD-10-CM | POA: Insufficient documentation

## 2018-03-28 DIAGNOSIS — N920 Excessive and frequent menstruation with regular cycle: Secondary | ICD-10-CM | POA: Diagnosis not present

## 2018-03-28 DIAGNOSIS — F17200 Nicotine dependence, unspecified, uncomplicated: Secondary | ICD-10-CM | POA: Insufficient documentation

## 2018-03-28 DIAGNOSIS — D508 Other iron deficiency anemias: Secondary | ICD-10-CM | POA: Insufficient documentation

## 2018-03-28 LAB — IRON AND TIBC
IRON: 21 ug/dL — AB (ref 28–170)
Saturation Ratios: 6 % — ABNORMAL LOW (ref 10.4–31.8)
TIBC: 385 ug/dL (ref 250–450)
UIBC: 364 ug/dL

## 2018-03-28 LAB — COMPREHENSIVE METABOLIC PANEL
ALBUMIN: 3.8 g/dL (ref 3.5–5.0)
ALK PHOS: 32 U/L — AB (ref 38–126)
ALT: 8 U/L (ref 0–44)
AST: 14 U/L — AB (ref 15–41)
Anion gap: 7 (ref 5–15)
BUN: 13 mg/dL (ref 6–20)
CALCIUM: 8.6 mg/dL — AB (ref 8.9–10.3)
CHLORIDE: 108 mmol/L (ref 98–111)
CO2: 26 mmol/L (ref 22–32)
CREATININE: 0.61 mg/dL (ref 0.44–1.00)
GFR calc Af Amer: >60 mL/min (ref >60)
GFR calc non Af Amer: >60 mL/min (ref >60)
GLUCOSE: 87 mg/dL (ref 70–99)
Potassium: 4.4 mmol/L (ref 3.5–5.1)
SODIUM: 141 mmol/L (ref 135–145)
Total Bilirubin: 0.4 mg/dL (ref 0.3–1.2)
Total Protein: 7.3 g/dL (ref 6.5–8.1)

## 2018-03-28 LAB — CBC WITH DIFFERENTIAL/PLATELET
BASOS ABS: 0 10*3/uL (ref 0–0.1)
Basophils Relative: 1 %
EOS ABS: 0.1 10*3/uL (ref 0–0.7)
EOS PCT: 2 %
HCT: 29.2 % — ABNORMAL LOW (ref 35.0–47.0)
HEMOGLOBIN: 9.4 g/dL — AB (ref 12.0–16.0)
LYMPHS ABS: 1.1 10*3/uL (ref 1.0–3.6)
Lymphocytes Relative: 31 %
MCH: 29.1 pg (ref 26.0–34.0)
MCHC: 32.2 g/dL (ref 32.0–36.0)
MCV: 90.2 fL (ref 80.0–100.0)
Monocytes Absolute: 0.3 10*3/uL (ref 0.2–0.9)
Monocytes Relative: 10 %
NEUTROS PCT: 56 %
Neutro Abs: 1.9 10*3/uL (ref 1.4–6.5)
PLATELETS: 303 10*3/uL (ref 150–440)
RBC: 3.24 MIL/uL — ABNORMAL LOW (ref 3.80–5.20)
RDW: 22.5 % — ABNORMAL HIGH (ref 11.5–14.5)
WBC: 3.4 10*3/uL — AB (ref 3.6–11.0)

## 2018-03-28 LAB — FERRITIN: Ferritin: 17 ng/mL (ref 11–307)

## 2018-03-28 MED ORDER — SODIUM CHLORIDE 0.9 % IV SOLN
Freq: Once | INTRAVENOUS | Status: AC
  Administered 2018-03-28: 15:00:00 via INTRAVENOUS
  Filled 2018-03-28: qty 250

## 2018-03-28 MED ORDER — SODIUM CHLORIDE 0.9 % IV SOLN: 200.0000 mg | INTRAVENOUS | Status: DC

## 2018-03-28 MED ORDER — IRON SUCROSE 20 MG/ML IV SOLN
200.0000 mg | Freq: Once | INTRAVENOUS | Status: AC
  Administered 2018-03-28: 200 mg via INTRAVENOUS
  Filled 2018-03-28: qty 10

## 2018-03-28 NOTE — Progress Notes (Signed)
Hematology/Oncology Follow up note [facility] [phone] [phone]   Patient Care Team: Patient, No Pcp Per as PCP - General (General Practice) Patient, No Pcp Per (General Practice) REASON FOR VISIT Follow up for treatment of triple negative breast cancer and iron deficiency anemia.     HISTORY OF PRESENTING ILLNESS:  Cindy Brewer is a  47 y.o.  female with PMH listed below who was referred to me for evaluation of newly diagnosed breast cancer. Patient is a 47 year old female who felt a lump in the upper portion of the left breast about 3-4 months ago. Mammogram on August 03, 2017 showed 2 indeterminate hypoechoic masses at the 12:00 and 11:00.  These are the 2 masses corresponding to patient's palpable abnormality.There is also stable probably benign mass at the 2 o'clock position, possibly related to a complicated cyst or fibroadenoma.  #Patient underwent biopsy of the 12:00 and 11:00 mass and up pathology revealed both mass showed invasive ductal carcinoma, pathology commented that the carcinoma in the 2 specimen is somewhat similar and is grade 3.  The breast prognostic profile is performed on part 1, which showed ER PR negative and HER-2/neu negative, ki67 80%  #Patient supposed to have biopsy of the 2:00 mass which she missed the appointment.  Patient was seen and evaluated by surgeon Dr. Autumn Messing.  Given the size of the tumor and the fact that it is triple negative Dr. Marlou Starks referred the patient to see oncology to discuss about neoadjuvant chemotherapy.  Dr. Marlou Starks also ordered MRI of the breast and the patient has not have it done yet. Patient no showed a few appointment with me.  RN navigator Briny Breezes and I have tried multiple times to reach her.RN navigator [place] and I have tried multiple times to reach her.  # Triple negative breast cancer: she declined neoadjuvant chemotherapy and has lumpectomy with sentinel LN biopsy on  11/24/2017.  Pathology showed two separate foci of invasive mammary carcinoma with associated tumor necrosis, DCIS present, fibroadenoma 30m,  Foci 1 is 463m and foci 2 is 3533mmargins are negative. Sentinel LN 0/0 involved. Negative LVI Pathology Stage: mpT2 pN0.    INTERVAL HISTORY SheIndya Brewer a 46 13o. female who has above history reviewed by me today presents for follow up visit for triple negative breast cancer and iron deficiency anemia.   # Triple negative breast cancer, declined neoadjuvant or adjuvant chemotherapy. Denies any bone pain, new lumps or bumps.   # Fatigue, chronic, ongoing. Improves after IV iron infusion, but gradually gets worse again  # Menorrhagia,continue to have heavy menstrual period, has not established care yet.  .  Review of Systems  Constitutional: Positive for malaise/fatigue. Negative for chills, fever and weight loss.  HENT: Negative for congestion, ear discharge, ear pain, hearing loss, nosebleeds, sinus pain, sore throat and tinnitus.   Eyes: Negative for double vision, photophobia, pain, discharge and redness.  Respiratory: Negative for cough, hemoptysis, sputum production, shortness of breath and wheezing.   Cardiovascular: Negative for chest pain, palpitations, orthopnea, claudication and leg swelling.  Gastrointestinal: Negative for abdominal pain, blood in stool, constipation, diarrhea, heartburn, melena, nausea and vomiting.  Genitourinary: Negative for dysuria, flank pain, frequency, hematuria and urgency.  Musculoskeletal: Negative for back pain, myalgias and neck pain.  Skin: Negative for itching and rash.  Neurological: Negative for dizziness, tingling, tremors, sensory change, focal weakness, weakness and headaches.  Endo/Heme/Allergies: Negative for environmental allergies. Does not bruise/bleed easily.  Psychiatric/Behavioral: Negative for  depression, hallucinations and substance abuse. The patient is not nervous/anxious.       MEDICAL HISTORY:  Past Medical History:  Diagnosis Date  . Anemia   . Anxiety   . Cancer (HCC)    BREAST  . Depression   . Fibroid   . GERD (gastroesophageal reflux disease)     SURGICAL HISTORY: Past Surgical History:  Procedure Laterality Date  . BREAST LUMPECTOMY WITH AXILLARY LYMPH NODE BIOPSY Left 11/24/2017   Procedure: LEFT BREAST LUMPECTOMY WITH LEFT SENTINEL NODE LYMPH NODE BIOPSY;  Surgeon: Jovita Kussmaul, MD;  Location: [facility] ORS;  Service: General;  Laterality: Left;  . CESAREAN SECTION    . EYE SURGERY     DETACHED RETINA 4/519    SOCIAL HISTORY: Social History   Socioeconomic History  . Marital status: Divorced    Spouse name: Not on file  . Number of children: Not on file  . Years of education: Not on file  . Highest education level: Not on file  Occupational History  . Not on file  Social Needs  . Financial resource strain: Not on file  . Food insecurity:    Worry: Not on file    Inability: Not on file  . Transportation needs:    Medical: Not on file    Non-medical: Not on file  Tobacco Use  . Smoking status: Current Every Day Smoker  . Smokeless tobacco: Never Used  Substance and Sexual Activity  . Alcohol use: Yes    Comment: weekends  . Drug use: No  . Sexual activity: Yes    Birth control/protection: None  Lifestyle  . Physical activity:    Days per week: 7 days    Minutes per session: 20 min  . Stress: Only a little  Relationships  . Social connections:    Talks on phone: Once a week    Gets together: Once a week    Attends religious service: Never    Active member of club or organization: No    Attends meetings of clubs or organizations: Never    Relationship status: Divorced  . Intimate partner violence:    Fear of current or ex partner: No    Emotionally abused: No    Physically abused: No    Forced sexual activity: No  Other Topics Concern  . Not on file  Social History Narrative   ** Merged History Encounter **         FAMILY HISTORY: Family History  Problem Relation Age of Onset  . Stroke Maternal Grandmother     ALLERGIES:  is allergic to demerol [meperidine]; percocet [oxycodone-acetaminophen]; and sulfa antibiotics.  MEDICATIONS:  Current Outpatient Medications  Medication Sig Dispense Refill  . ALPRAZolam (XANAX) 0.25 MG tablet Take 1 tablet (0.25 mg total) by mouth 2 (two) times daily as needed for anxiety. 30 tablet 0  . cimetidine (TAGAMET HB) 200 MG tablet Take 200 mg by mouth daily as needed (acid reflux).    [name] diphenhydramine-acetaminophen (TYLENOL PM) 25-500 MG TABS tablet Take 2 tablets by mouth at bedtime as needed (sleep/pain).    [name] HYDROcodone-acetaminophen (NORCO/VICODIN) 5-325 MG tablet Take 1-2 tablets by mouth every 6 (six) hours as needed for moderate pain or severe pain. 15 tablet 0  . ibuprofen (ADVIL,MOTRIN) 600 MG tablet Take 1 tablet by mouth three times daily with meals 15 tablet 0  . Multiple Vitamin (MULTIVITAMIN WITH MINERALS) TABS tablet Take 1 tablet by mouth daily.    [name] senna-docusate (SENOKOT-S) 8.6-50  MG tablet Take 2 tablets by mouth daily. 30 tablet 0  . traMADol (ULTRAM) 50 MG tablet Take 1-2 tablets (50-100 mg total) by mouth every 6 (six) hours as needed. 20 tablet 1  . medroxyPROGESTERone (PROVERA) 10 MG tablet Take 1 tablet (10 mg total) by mouth 3 (three) times daily as needed for up to 7 days (heavy menstrual bleeding). 21 tablet 4   No current facility-administered medications for this visit.      PHYSICAL EXAMINATION: ECOG PERFORMANCE STATUS: 1 - Symptomatic but completely ambulatory Vitals:   03/28/18 1403  BP: 134/83  Pulse: 83  Resp: 18  Temp: 98.3 F (36.8 C)   Filed Weights   03/28/18 1403  Weight: 147 lb 6.4 oz (66.9 kg)    Physical Exam  Constitutional: She is oriented to person, place, and time. No distress.  HENT:  Head: Normocephalic and atraumatic.  Nose: Nose normal.  Mouth/Throat: Oropharynx is clear and moist. No  oropharyngeal exudate.  Eyes: Pupils are equal, round, and reactive to light. EOM are normal. Left eye exhibits no discharge. No scleral icterus.  Neck: Normal range of motion. Neck supple. No JVD present.  Cardiovascular: Normal rate, regular rhythm and normal heart sounds.  No murmur heard. Pulmonary/Chest: Effort normal and breath sounds normal. No respiratory distress. She has no wheezes. She has no rales. She exhibits no tenderness.  Abdominal: Soft. Bowel sounds are normal. She exhibits no distension and no mass. There is no tenderness. There is no rebound.  Musculoskeletal: Normal range of motion. She exhibits no edema or tenderness.  Lymphadenopathy:    She has no cervical adenopathy.  Neurological: She is alert and oriented to person, place, and time. No cranial nerve deficit. She exhibits normal muscle tone. Coordination normal.  Skin: Skin is warm and dry. No rash noted. She is not diaphoretic. No erythema.  Psychiatric: Memory, affect and judgment normal.  Breast exam was performed in seated and lying down position. Patient is status post left lumpectomy with a well-healing surgical scar. No evidence of any palpable masses. No evidence of axillary adenopathy. No evidence of any palpable masses or lumps in the right breast. No evidence of right axillary adenopathy       LABORATORY DATA:  I have reviewed the data as listed Lab Results  Component Value Date   WBC 3.4 (L) 03/28/2018   HGB 9.4 (L) 03/28/2018   HCT 29.2 (L) 03/28/2018   MCV 90.2 03/28/2018   PLT 303 03/28/2018   Recent Labs    01/20/18 0248 01/26/18 1044 03/28/18 1420  NA 138 136 141  K 3.7 4.0 4.4  CL 109 105 108  CO2 _0 GLUCOSE 115* 101* 87  BUN _1 CREATININE 0.82 0.72 0.61  CALCIUM 8.5* 8.8* 8.6*  GFRNONAA >60 >60 >60  GFRAA >60 >60 >60  PROT 7.2 7.6 7.3  ALBUMIN 3.5 3.7 3.8  AST 14* 13* 14*  ALT <_2 ALKPHOS 26* 34* 32*  BILITOT 0.3 0.2* 0.4    CT renal scan 01/20/2018   independently reviewed by me.  No evidence of metastasis.    ASSESSMENT & PLAN:  Cancer Staging Malignant neoplasm of overlapping sites of left breast in female, estrogen receptor negative ([place]) Staging form: Breast, AJCC 8th Edition - Clinical stage from 08/25/2017: Stage IIB (cT2(2), cN0, cM0, G3, ER: Negative, PR: Negative, HER2: Negative) - Signed by Earlie Server, MD on 08/25/2017  1. Malignant neoplasm of overlapping sites of left  breast in female, estrogen receptor negative ([place])   2. Iron deficiency anemia due to chronic blood loss   3. Thyroid nodule   4. Menorrhalgia    # Multifocal triple negative breast cancer clinically Stage IIB (pT2 pN0,cM0) Declined chemotherapy previously. High recurrence risk.  Plan repeat diagnostic unilateral mammogram Refer to genetic counselor for discussion for genetic testing. Discussed with patient.    #Iron deficiency anemia,  Labs reviewed and discussed with patient. Ongoing blood loss. Hemoglobin stable in 9s.  Will proceed with one dose of IV venofer 259m x 1 as maintenance.  # Fatigue, likely due to chronic anemia.   # Menorrhagia, discussed with patient and encourage her to establish care with GYN.  # Thyroid nodule, previously ordered thyroid ultrasound not done. We spent sufficient time to discuss many aspect of care, questions were answered to patient's satisfaction.  Return of visit:  4 weeks with repeat labs.  Orders Placed This Encounter  Procedures  . MM Digital Diagnostic Unilat L    Standing Status:   Future    Standing Expiration Date:   03/28/2019    Order Specific Question:   Reason for Exam (SYMPTOM  OR DIAGNOSIS REQUIRED)    Answer:   triple negative breast cancer s/p surgery, declined adjuvant chemo    Order Specific Question:   Is the patient pregnant?    Answer:   No    Order Specific Question:   Preferred imaging location?    Answer:   [facility]  . [facility] [place]    Standing Status:    Future    Standing Expiration Date:   05/29/2019    Order Specific Question:   Reason for Exam (SYMPTOM  OR DIAGNOSIS REQUIRED)    Answer:   Breast cancer    Order Specific Question:   Preferred imaging location?    Answer:   [facility]  . MM DIAG BREAST TOMO BILATERAL    Standing Status:   Future    Standing Expiration Date:   03/29/2019    Order Specific Question:   Reason for Exam (SYMPTOM  OR DIAGNOSIS REQUIRED)    Answer:   Breast cancer    Order Specific Question:   Is the patient pregnant?    Answer:   No    Order Specific Question:   Preferred [facility] location?    [facility]  . Comprehensive metabolic panel    Standing Status:   Future    Number of Occurrences:   1    Standing Expiration Date:   03/29/2019  . CBC with Differential/Platelet    Standing Status:   Future    Standing Expiration Date:   03/28/2019  . Comprehensive metabolic panel    Standing Status:   Future    Standing Expiration Date:   03/28/2019  . Ferritin    Standing Status:   Future    Standing Expiration Date:   03/29/2019  . Iron and TIBC    Standing Status:   Future    Standing Expiration Date:   03/29/2019     ZEarlie Server MD, PhD Hematology Oncology [facility] LLCat AChildren'S National Emergency [facility] At United Medical CenterPager- [phone]/25/2019

## 2018-03-28 NOTE — Progress Notes (Signed)
Patient here for follow up. States she has been more tired due to lack of sleep at night.

## 2018-03-28 NOTE — Telephone Encounter (Signed)
Patient following up on referral for specialist in [place] regarding fibroids?

## 2018-03-29 NOTE — Telephone Encounter (Signed)
Would you mind contacting the patient once you've put in the referral? Thank you!

## 2018-03-30 NOTE — Telephone Encounter (Signed)
Patient returned the call and is aware to expect a call from [facility]. I apologized for the delay and the patient was very understanding and will await the call.

## 2018-03-30 NOTE — Telephone Encounter (Signed)
Referral faxed. [facility] to contact the patient directly to schedule an appointment. Attempted to contact the patient. No answer, v/m is full.

## 2018-04-02 ENCOUNTER — Other Ambulatory Visit: Payer: Self-pay | Admitting: Genetic Counselor

## 2018-04-02 ENCOUNTER — Telehealth: Payer: Self-pay | Admitting: Genetic Counselor

## 2018-04-02 ENCOUNTER — Encounter: Payer: Self-pay | Admitting: Genetic Counselor

## 2018-04-02 DIAGNOSIS — C50812 Malignant neoplasm of overlapping sites of left female breast: Secondary | ICD-10-CM

## 2018-04-02 DIAGNOSIS — Z171 Estrogen receptor negative status [ER-]: Principal | ICD-10-CM

## 2018-04-02 NOTE — Telephone Encounter (Signed)
Cancer Genetics            Telegenetics Initial Visit    Patient Name: Cindy Brewer Patient DOB: [birth year] Patient Age: 47 y.o. Phone Call Date: 04/02/2018  Referring Provider: Earlie Server, MD  Reason for Visit: Evaluate for hereditary susceptibility to cancer    Assessment and Plan:  . Cindy Brewer's history of triple negative breast cancer at age 18 warrants a genetics evaluation. She has very little information about paternal family history, which makes a risk assessment challenging.   . Testing is recommended to determine whether she has a pathogenic mutation that will impact her screening and risk-reduction for future cancer. A negative result will be reassuring.  . Cindy Brewer wished to pursue genetic testing, and stated she would like to get her blood drawn for this testing when she is scheduled to be in clinic next time: 04/27/18. Analysis will include the 84 genes on [facility]'s Multi-Cancer panel (AIP, ALK, APC, ATM, AXIN2, BAP1, BARD1, BLM, BMPR1A, BRCA1, BRCA2, BRIP1, CASR, CDC73, CDH1, CDK4, CDKN1B, CDKN1C, CDKN2A, CEBPA, CHEK2, CTNNA1, DICER1, DIS3L2, EGFR, EPCAM, FH, FLCN, GATA2, GPC3, GREM1, HOXB13, HRAS, KIT, MAX, MEN1, MET, MITF, MLH1, MSH2, MSH3, MSH6, MUTYH, NBN, NF1, NF2, NTHL1, PALB2, PDGFRA, PHOX2B, PMS2, POLD1, POLE, POT1, PRKAR1A, PTCH1, PTEN, RAD50, RAD51C, RAD51D, RB1, RECQL4, RET, RUNX1, SDHA, SDHAF2, SDHB, SDHC, SDHD, SMAD4, SMARCA4, SMARCB1, SMARCE1, STK11, SUFU, TERC, TERT, TMEM127, TP53, TSC1, TSC2, VHL, WRN, WT1).   . Once the lab receives her specimen, results should be available in approximately 2-3 weeks, at which point I will contact her and address implications for her as well as address genetic testing for at-risk family members, if needed.     Dr. Grayland Ormond was available for questions concerning this case. Total time spent by counseling by phone was approximately 20 minutes.    _____________________________________________________________________   History of Present Illness: Cindy Brewer, a 47 y.o. female, was referred for genetic counseling to discuss the possibility of a hereditary predisposition to cancer and discuss whether genetic testing is warranted. This was a telegenetics visit via phone.  Cindy Brewer was diagnosed with left breast cancer at the age of 69. She is s/p surgery and declined neoadjuvant chemotherapy.  The breast tumor was ER negative, PR negative, and HER2 negative.    Past Medical History:  Diagnosis Date  . Anemia   . Anxiety   . Cancer (HCC)    BREAST  . Depression   . Fibroid   . GERD (gastroesophageal reflux disease)     Past Surgical History:  Procedure Laterality Date  . BREAST LUMPECTOMY WITH AXILLARY LYMPH NODE BIOPSY Left 11/24/2017   Procedure: LEFT BREAST LUMPECTOMY WITH LEFT SENTINEL NODE LYMPH NODE BIOPSY;  Surgeon: Jovita Kussmaul, MD;  Location: [facility] ORS;  Service: General;  Laterality: Left;  . CESAREAN SECTION    . EYE SURGERY     DETACHED RETINA 4/519    Family History: There are no known cancers in Cindy Brewer's family history, but she knows very little about her paternal relatives.  Cindy Brewer has a son and 4 daughters (ages 76-28). She has one full-brother (age 64), a paternal half-brother, and a maternal half-brother and half-sister. Her mother (age 36) is cancer-free. Her mother had one brother. Her father (age 72) is cancer-free. He had siblings, but she has no information about them.  Cindy Brewer ancestry is African American. There is no known Jewish ancestry and no  consanguinity.  Discussion: We reviewed the characteristics, features and inheritance patterns of hereditary cancer syndromes. We discussed her risk of harboring a mutation in the context of her personal and family history. We discussed that her unknown paternal family and paucity of women makes risk  assessment challenging. We discussed the process of genetic testing, insurance coverage and implications of results: positive, negative and variant of uncertain significance (VUS).   Cindy Brewer questions were answered to her satisfaction today and she is welcome to call with any additional questions or concerns. Thank you for the referral and allowing [place] to share in the care of your patient.    Steele Berg, MS, [place] Certified Genetic Counselor phone: [phone]

## 2018-04-26 ENCOUNTER — Other Ambulatory Visit: Payer: Self-pay

## 2018-04-27 ENCOUNTER — Inpatient Hospital Stay: Payer: Medicaid Other

## 2018-04-27 ENCOUNTER — Encounter: Payer: Self-pay | Admitting: Oncology

## 2018-04-27 ENCOUNTER — Inpatient Hospital Stay: Payer: Medicaid Other | Attending: Oncology

## 2018-04-27 ENCOUNTER — Ambulatory Visit: Payer: Self-pay | Admitting: Oncology

## 2018-04-27 ENCOUNTER — Ambulatory Visit: Payer: Self-pay

## 2018-04-27 ENCOUNTER — Inpatient Hospital Stay (HOSPITAL_BASED_OUTPATIENT_CLINIC_OR_DEPARTMENT_OTHER): Payer: Medicaid Other | Admitting: Oncology

## 2018-04-27 ENCOUNTER — Other Ambulatory Visit: Payer: Self-pay

## 2018-04-27 VITALS — BP 143/97 | HR 73 | Temp 97.4°F | Resp 18 | Wt 148.1 lb

## 2018-04-27 VITALS — BP 146/85 | HR 65 | Temp 97.4°F | Resp 18

## 2018-04-27 DIAGNOSIS — Z79899 Other long term (current) drug therapy: Secondary | ICD-10-CM

## 2018-04-27 DIAGNOSIS — Z171 Estrogen receptor negative status [ER-]: Secondary | ICD-10-CM | POA: Diagnosis not present

## 2018-04-27 DIAGNOSIS — C50812 Malignant neoplasm of overlapping sites of left female breast: Secondary | ICD-10-CM

## 2018-04-27 DIAGNOSIS — N920 Excessive and frequent menstruation with regular cycle: Secondary | ICD-10-CM

## 2018-04-27 DIAGNOSIS — E041 Nontoxic single thyroid nodule: Secondary | ICD-10-CM | POA: Diagnosis not present

## 2018-04-27 DIAGNOSIS — D500 Iron deficiency anemia secondary to blood loss (chronic): Secondary | ICD-10-CM | POA: Diagnosis not present

## 2018-04-27 DIAGNOSIS — E876 Hypokalemia: Secondary | ICD-10-CM | POA: Diagnosis not present

## 2018-04-27 LAB — COMPREHENSIVE METABOLIC PANEL
ALBUMIN: 3.9 g/dL (ref 3.5–5.0)
ALK PHOS: 32 U/L — AB (ref 38–126)
ALT: 8 U/L (ref 0–44)
AST: 14 U/L — ABNORMAL LOW (ref 15–41)
Anion gap: 8 (ref 5–15)
BILIRUBIN TOTAL: 0.6 mg/dL (ref 0.3–1.2)
BUN: 13 mg/dL (ref 6–20)
CALCIUM: 9.2 mg/dL (ref 8.9–10.3)
CO2: 26 mmol/L (ref 22–32)
CREATININE: 0.64 mg/dL (ref 0.44–1.00)
Chloride: 107 mmol/L (ref 98–111)
GFR calc non Af Amer: >60 mL/min (ref >60)
GLUCOSE: 108 mg/dL — AB (ref 70–99)
Potassium: 3.2 mmol/L — ABNORMAL LOW (ref 3.5–5.1)
Sodium: 141 mmol/L (ref 135–145)
TOTAL PROTEIN: 7.4 g/dL (ref 6.5–8.1)

## 2018-04-27 LAB — CBC WITH DIFFERENTIAL/PLATELET
Abs Immature Granulocytes: 0 10*3/uL (ref 0.00–0.07)
BASOS PCT: 1 %
Basophils Absolute: 0 10*3/uL (ref 0.0–0.1)
EOS ABS: 0.1 10*3/uL (ref 0.0–0.5)
EOS PCT: 2 %
HEMATOCRIT: 31 % — AB (ref 36.0–46.0)
Hemoglobin: 9.7 g/dL — ABNORMAL LOW (ref 12.0–15.0)
Immature Granulocytes: 0 %
LYMPHS ABS: 1.6 10*3/uL (ref 0.7–4.0)
Lymphocytes Relative: 38 %
MCH: 28.7 pg (ref 26.0–34.0)
MCHC: 31.3 g/dL (ref 30.0–36.0)
MCV: 91.7 fL (ref 80.0–100.0)
MONOS PCT: 8 %
Monocytes Absolute: 0.3 10*3/uL (ref 0.1–1.0)
NRBC: 0 % (ref 0.0–0.2)
Neutro Abs: 2.1 10*3/uL (ref 1.7–7.7)
Neutrophils Relative %: 51 %
Platelets: 286 10*3/uL (ref 150–400)
RBC: 3.38 MIL/uL — ABNORMAL LOW (ref 3.87–5.11)
RDW: 16.1 % — AB (ref 11.5–15.5)
WBC: 4.2 10*3/uL (ref 4.0–10.5)

## 2018-04-27 LAB — IRON AND TIBC
IRON: 27 ug/dL — AB (ref 28–170)
Saturation Ratios: 8 % — ABNORMAL LOW (ref 10.4–31.8)
TIBC: 350 ug/dL (ref 250–450)
UIBC: 323 ug/dL

## 2018-04-27 LAB — FERRITIN: Ferritin: 17 ng/mL (ref 11–307)

## 2018-04-27 MED ORDER — IRON SUCROSE 20 MG/ML IV SOLN
200.0000 mg | Freq: Once | INTRAVENOUS | Status: AC
  Administered 2018-04-27: 200 mg via INTRAVENOUS
  Filled 2018-04-27: qty 10

## 2018-04-27 MED ORDER — SODIUM CHLORIDE 0.9 % IV SOLN
Freq: Once | INTRAVENOUS | Status: AC
  Administered 2018-04-27: 14:00:00 via INTRAVENOUS
  Filled 2018-04-27: qty 250

## 2018-04-27 MED ORDER — SODIUM CHLORIDE 0.9 % IV SOLN: 200.0000 mg | INTRAVENOUS | Status: DC

## 2018-04-27 NOTE — Progress Notes (Signed)
Patient here for follow up. Complains of daily nausea, constipation and having trouble swallowing liquids. Patient was having sever abdominal cramps this past weekend.

## 2018-04-28 MED ORDER — POTASSIUM CHLORIDE ER 10 MEQ PO TBCR: 10.0000 meq | EXTENDED_RELEASE_TABLET | Freq: Every day | ORAL | 0 refills | Status: DC

## 2018-04-28 NOTE — Progress Notes (Signed)
Hematology/Oncology Follow up note [facility] [phone] [phone]   Patient Care Team: Patient, No Pcp Per as PCP - General (General Practice) Patient, No Pcp Per (General Practice) REASON FOR VISIT Follow up for treatment of triple negative breast cancer and iron deficiency anemia.     HISTORY OF PRESENTING ILLNESS:  Cindy Brewer is a  47 y.o.  female with PMH listed below who was referred to me for evaluation of newly diagnosed breast cancer. Patient is a 47 year old female who felt a lump in the upper portion of the left breast about 3-4 months ago. Mammogram on August 03, 2017 showed 2 indeterminate hypoechoic masses at the 12:00 and 11:00.  These are the 2 masses corresponding to patient's palpable abnormality.There is also stable probably benign mass at the 2 o'clock position, possibly related to a complicated cyst or fibroadenoma.  #Patient underwent biopsy of the 12:00 and 11:00 mass and up pathology revealed both mass showed invasive ductal carcinoma, pathology commented that the carcinoma in the 2 specimen is somewhat similar and is grade 3.  The breast prognostic profile is performed on part 1, which showed ER PR negative and HER-2/neu negative, ki67 80%  #Patient supposed to have biopsy of the 2:00 mass which she missed the appointment.  Patient was seen and evaluated by surgeon Dr. Autumn Messing.  Given the size of the tumor and the fact that it is triple negative Dr. Marlou Starks referred the patient to see oncology to discuss about neoadjuvant chemotherapy.  Dr. Marlou Starks also ordered MRI of the breast and the patient has not have it done yet. Patient no showed a few appointment with me.  RN navigator Cindy Brewer and I have tried multiple times to reach her.RN navigator [place] and I have tried multiple times to reach her.  # Triple negative breast cancer: she declined neoadjuvant chemotherapy and has lumpectomy with sentinel LN biopsy on  11/24/2017.  Pathology showed two separate foci of invasive mammary carcinoma with associated tumor necrosis, DCIS present, fibroadenoma 36m,  Foci 1 is 429m and foci 2 is 3535mmargins are negative. Sentinel LN 0/0 involved. Negative LVI Pathology Stage: mpT2 pN0.    INTERVAL HISTORY SheErleen Brewer a 46 30o. female who has above history reviewed by me today presents for follow up visit for Triple negative breast cancer and iron deficiency anemia   # Continue to have heavy menstrual period, abdominal cramps. She was seen by Gyn Dr. JacGlennon Mache underwent endometrial biopsy.  Per patient that she is suppose [place] and has not heard back yet.   # Continues to feel tired and fatigue. Occasionally she has nausea.  Denies any bone pain, new lumps or bumps.  .  Review of Systems  Constitutional: Positive for malaise/fatigue. Negative for chills, fever and weight loss.  HENT: Negative for congestion, ear discharge, ear pain, hearing loss, nosebleeds, sinus pain, sore throat and tinnitus.   Eyes: Negative for double vision, photophobia, pain, discharge and redness.  Respiratory: Negative for cough, hemoptysis, sputum production, shortness of breath and wheezing.   Cardiovascular: Negative for chest pain, palpitations, orthopnea, claudication and leg swelling.  Gastrointestinal: Positive for abdominal pain, constipation and nausea. Negative for blood in stool, diarrhea, heartburn, melena and vomiting.  Genitourinary: Negative for dysuria, flank pain, frequency, hematuria and urgency.  Musculoskeletal: Negative for back pain, myalgias and neck pain.  Skin: Negative for itching and rash.  Neurological: Negative for dizziness, tingling, tremors,  sensory change, focal weakness, weakness and headaches.  Endo/Heme/Allergies: Negative for environmental allergies. Does not bruise/bleed easily.  Psychiatric/Behavioral: Negative for depression,  hallucinations and substance abuse. The patient is not nervous/anxious.     MEDICAL HISTORY:  Past Medical History:  Diagnosis Date  . Anemia   . Anxiety   . Cancer (HCC)    BREAST  . Depression   . Fibroid   . GERD (gastroesophageal reflux disease)     SURGICAL HISTORY: Past Surgical History:  Procedure Laterality Date  . BREAST LUMPECTOMY WITH AXILLARY LYMPH NODE BIOPSY Left 11/24/2017   Procedure: LEFT BREAST LUMPECTOMY WITH LEFT SENTINEL NODE LYMPH NODE BIOPSY;  Surgeon: Jovita Kussmaul, MD;  Location: [facility] ORS;  Service: General;  Laterality: Left;  . CESAREAN SECTION    . EYE SURGERY     DETACHED RETINA 4/519    SOCIAL HISTORY: Social History   Socioeconomic History  . Marital status: Divorced    Spouse name: Not on file  . Number of children: Not on file  . Years of education: Not on file  . Highest education level: Not on file  Occupational History  . Not on file  Social Needs  . Financial resource strain: Not on file  . Food insecurity:    Worry: Not on file    Inability: Not on file  . Transportation needs:    Medical: Not on file    Non-medical: Not on file  Tobacco Use  . Smoking status: Current Every Day Smoker  . Smokeless tobacco: Never Used  Substance and Sexual Activity  . Alcohol use: Yes    Comment: weekends  . Drug use: No  . Sexual activity: Yes    Birth control/protection: None  Lifestyle  . Physical activity:    Days per week: 7 days    Minutes per session: 20 min  . Stress: Only a little  Relationships  . Social connections:    Talks on phone: Once a week    Gets together: Once a week    Attends religious service: Never    Active member of club or organization: No    Attends meetings of clubs or organizations: Never    Relationship status: Divorced  . Intimate partner violence:    Fear of current or ex partner: No    Emotionally abused: No    Physically abused: No    Forced sexual activity: No  Other Topics Concern  . Not  on file  Social History Narrative   ** Merged History Encounter **        FAMILY HISTORY: Family History  Problem Relation Age of Onset  . Stroke Maternal Grandmother     ALLERGIES:  is allergic to demerol [meperidine]; percocet [oxycodone-acetaminophen]; and sulfa antibiotics.  MEDICATIONS:  Current Outpatient Medications  Medication Sig Dispense Refill  . ALPRAZolam (XANAX) 0.25 MG tablet Take 1 tablet (0.25 mg total) by mouth 2 (two) times daily as needed for anxiety. 30 tablet 0  . cimetidine (TAGAMET HB) 200 MG tablet Take 200 mg by mouth daily as needed (acid reflux).    [name] diphenhydramine-acetaminophen (TYLENOL PM) 25-500 MG TABS tablet Take 2 tablets by mouth at bedtime as needed (sleep/pain).    [name] HYDROcodone-acetaminophen (NORCO/VICODIN) 5-325 MG tablet Take 1-2 tablets by mouth every 6 (six) hours as needed for moderate pain or severe pain. 15 tablet 0  . ibuprofen (ADVIL,MOTRIN) 600 MG tablet Take 1 tablet by mouth three times daily with meals 15 tablet 0  .  senna-docusate (SENOKOT-S) 8.6-50 MG tablet Take 2 tablets by mouth daily. 30 tablet 0  . medroxyPROGESTERone (PROVERA) 10 MG tablet Take 1 tablet (10 mg total) by mouth 3 (three) times daily as needed for up to 7 days (heavy menstrual bleeding). 21 tablet 4  . Multiple Vitamin (MULTIVITAMIN WITH MINERALS) TABS tablet Take 1 tablet by mouth daily.    . traMADol (ULTRAM) 50 MG tablet Take 1-2 tablets (50-100 mg total) by mouth every 6 (six) hours as needed. (Patient not taking: Reported on 04/27/2018) 20 tablet 1   No current facility-administered medications for this visit.      PHYSICAL EXAMINATION: ECOG PERFORMANCE STATUS: 1 - Symptomatic but completely ambulatory Vitals:   04/27/18 1319  BP: (!) 143/97  Pulse: 73  Resp: 18  Temp: (!) 97.4 F (36.3 C)   Filed Weights   04/27/18 1319  Weight: 148 lb 1.6 oz (67.2 kg)    Physical Exam  Constitutional: She is oriented to person, place, and time. No  distress.  HENT:  Head: Normocephalic and atraumatic.  Nose: Nose normal.  Mouth/Throat: Oropharynx is clear and moist. No oropharyngeal exudate.  Eyes: Pupils are equal, round, and reactive to light. EOM are normal. Left eye exhibits no discharge. No scleral icterus.  Neck: Normal range of motion. Neck supple. No JVD present.  Cardiovascular: Normal rate, regular rhythm and normal heart sounds.  No murmur heard. Pulmonary/Chest: Effort normal and breath sounds normal. No respiratory distress. She has no wheezes. She has no rales. She exhibits no tenderness.  Abdominal: Soft. Bowel sounds are normal. She exhibits no distension and no mass. There is no tenderness. There is no rebound.  Musculoskeletal: Normal range of motion. She exhibits no edema or tenderness.  Lymphadenopathy:    She has no cervical adenopathy.  Neurological: She is alert and oriented to person, place, and time. No cranial nerve deficit. She exhibits normal muscle tone. Coordination normal.  Skin: Skin is warm and dry. No rash noted. She is not diaphoretic. No erythema.  Psychiatric: Memory, affect and judgment normal.  Breast exam was performed in seated and lying down position. Patient is status post left lumpectomy with a well-healing surgical scar. No evidence of any palpable masses. No evidence of axillary adenopathy. No evidence of any palpable masses or lumps in the right breast. No evidence of right axillary adenopathy       LABORATORY DATA:  I have reviewed the data as listed Lab Results  Component Value Date   WBC 4.2 04/27/2018   HGB 9.7 (L) 04/27/2018   HCT 31.0 (L) 04/27/2018   MCV 91.7 04/27/2018   PLT 286 04/27/2018   Recent Labs    01/26/18 1044 03/28/18 1420 04/27/18 1257  NA 136 141 141  K 4.0 4.4 3.2*  CL 105 108 107  CO2 _0 GLUCOSE 101* 87 108*  BUN _1 CREATININE 0.72 0.61 0.64  CALCIUM 8.8* 8.6* 9.2  GFRNONAA >60 >60 >60  GFRAA >60 >60 >60  PROT 7.6 7.3 7.4  ALBUMIN  3.7 3.8 3.9  AST 13* 14* 14*  ALT _2 ALKPHOS 34* 32* 32*  BILITOT 0.2* 0.4 0.6    CT renal scan 01/20/2018  independently reviewed by me.  No evidence of metastasis.    ASSESSMENT & PLAN:  Cancer Staging Malignant neoplasm of overlapping sites of left breast in female, estrogen receptor negative ([place]) Staging form: Breast, AJCC 8th Edition - Clinical stage from 08/25/2017: Stage IIB (  cT2(2), cN0, cM0, G3, ER: Negative, PR: Negative, HER2: Negative) - Signed by Earlie Server, MD on 08/25/2017  1. Malignant neoplasm of overlapping sites of left breast in female, estrogen receptor negative ([place])   2. Hypokalemia   3. Iron deficiency anemia due to chronic blood loss   4. Thyroid nodule    # 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 High recurrence risk.  Obtain diagnostic bilateral mammogram for surveillance.  Refer to genetic counselor for discussion for genetic testing. Testing was done today.   # Iron deficiency anemia due to ongoing menorrhagia.  Labs are reviewed and discussed with patient. Continue to be anemic. Proceed with IV venofer 256m x 1.  [facility]   # Thyroid nodule, previously ordered thyroid ultrasound not done. # hypokalemia, K-dur 268m daily.   Return of visit:  4 weeks with repeat labs.  Orders Placed This Encounter  Procedures  . MM DIAG BREAST TOMO BILATERAL    Standing Status:   Future    Standing Expiration Date:   04/28/2019    Order Specific Question:   Reason for Exam (SYMPTOM  OR DIAGNOSIS REQUIRED)    Answer:   Breast cancer    Order Specific Question:   Is the patient pregnant?    Answer:   No    Order Specific Question:   Preferred imaging location?    Answer:   [facility]  . [facility]    Standing Status:   Future    Standing Expiration Date:   06/28/2019    Order Specific  Question:   Reason for Exam (SYMPTOM  OR DIAGNOSIS REQUIRED)    Answer:   Breast cancer    Order Specific Question:   Preferred imaging location?    Answer:   [facility]  . [facility] [place]    Standing Status:   Future    Standing Expiration Date:   06/28/2019    Order Specific Question:   Reason for Exam (SYMPTOM  OR DIAGNOSIS REQUIRED)    Answer:   breast cancer    Order Specific Question:   Preferred imaging location?    Answer:   [facility]  . Iron and TIBC    Standing Status:   Future    Standing Expiration Date:   04/28/2019  . CBC with Differential/Platelet    Standing Status:   Future    Standing Expiration Date:   04/28/2019  . Ferritin    Standing Status:   Future    Standing Expiration Date:   04/28/2019   Total face to face encounter time for this patient visit was 25 min. >50% of the time was  spent in counseling and coordination of care.    ZhEarlie ServerMD, PhD Hematology Oncology [facility] Hospital[facility]- [phone]/26/2019

## 2018-05-04 ENCOUNTER — Telehealth: Payer: Self-pay | Admitting: Genetic Counselor

## 2018-05-04 NOTE — Telephone Encounter (Signed)
Cancer Genetics             Telegenetics Results Disclosure   Patient Name: Cindy Brewer Patient DOB: 1970/11/08 Patient Age: 47 y.o. Phone Call Date: 05/04/2018  Referring Provider: Earlie Server, MD    Cindy Brewer was called today to discuss genetic test results. Please see the Genetics telephone note from 04/02/2018 for a detailed discussion of her personal and family histories and the recommendations provided.  Genetic Testing: At the time of Cindy Brewer's telegenetics visit, she decided to pursue genetic testing of multiple genes associated with hereditary susceptibility to cancer. Testing included sequencing and deletion/duplication analysis. Testing did not reveal a pathogenic mutation in any of the genes analyzed.  A copy of the genetic test report will be scanned into Epic under the Media tab.  Interpretation: These results suggest that Cindy Brewer's cancer was most likely not due to an inherited predisposition. Most cancers happen by chance and this test, along with details of her family history, suggests that her cancer falls into this category.   Since the current test is not perfect, it is possible that there may be a gene mutation that current testing cannot detect, but that chance is small. It is possible that a different genetic factor, which has not yet been discovered or is not on this panel, is responsible for the cancer diagnoses in the family. Again, the likelihood of this is low. No additional testing is recommended at this time for Cindy Brewer.  Genes Analyzed: The genes analyzed were the 84 genes on [facility]'s Multi-Cancer panel (AIP, ALK, APC, ATM, AXIN2, BAP1, BARD1, BLM, BMPR1A, BRCA1, BRCA2, BRIP1, CASR, CDC73, CDH1, CDK4, CDKN1B, CDKN1C, CDKN2A, CEBPA, CHEK2, CTNNA1, DICER1, DIS3L2, EGFR, EPCAM, FH, FLCN, GATA2, GPC3, GREM1, HOXB13, HRAS, KIT, MAX, MEN1, MET, MITF, MLH1, MSH2, MSH3, MSH6, MUTYH, NBN, NF1, NF2, NTHL1,  PALB2, PDGFRA, PHOX2B, PMS2, POLD1, POLE, POT1, PRKAR1A, PTCH1, PTEN, RAD50, RAD51C, RAD51D, RB1, RECQL4, RET, RUNX1, SDHA, SDHAF2, SDHB, SDHC, SDHD, SMAD4, SMARCA4, SMARCB1, SMARCE1, STK11, SUFU, TERC, TERT, TMEM127, TP53, TSC1, TSC2, VHL, WRN, WT1).  Cancer Screening: Cindy Brewer is recommended to follow the cancer screening guidelines provided by her physicians.   Family Members: Given the young age of breast cancer in Cindy Brewer (age 89), women are recommended to speak with their own providers about having a yearly mammogram beginning at age 61, which is 10 years earlier than the youngest breast cancer in the family and discuss with their own provider whether there is a benefit to adding tomosynthesis to the mammogram. Women are recommended to also have a yearly clinical breast exam, a yearly gynecologic exam and perform monthly breast self-exams. Colon cancer screening is recommended to begin by age 46 in both men and women, unless there is a family history of colon cancer or colon polyps or an individual has a personal history to warrant initiating screening at a younger age.  Any relative who had cancer at a young age or had a particularly rare cancer may also wish to pursue genetic testing. Genetic counselors can be located in other cities, by visiting the website of the [employer] (ArtistMovie.se) and Field seismologist for a Dietitian by zip code.   Follow-Up: Cancer genetics is a rapidly advancing field and it is possible that new genetic tests will be appropriate for Cindy Brewer in the future. Cindy Brewer is encouraged  to remain in contact with Genetics on an annual basis so we can update her personal and family histories, and let her know of advances in cancer genetics that may benefit the family. Cindy Brewer questions were answered to her satisfaction today, and she knows she is welcome to call anytime with additional questions.    Steele Berg, MS, [place] Certified Genetic Counselor phone: [phone]

## 2018-05-11 ENCOUNTER — Other Ambulatory Visit: Payer: Self-pay

## 2018-05-25 ENCOUNTER — Encounter: Payer: Self-pay | Admitting: Oncology

## 2018-05-25 ENCOUNTER — Inpatient Hospital Stay: Payer: Medicaid Other

## 2018-05-25 ENCOUNTER — Inpatient Hospital Stay: Payer: Medicaid Other | Admitting: Oncology

## 2018-05-25 ENCOUNTER — Other Ambulatory Visit: Payer: Self-pay

## 2018-05-25 ENCOUNTER — Telehealth: Payer: Self-pay | Admitting: Obstetrics and Gynecology

## 2018-05-25 DIAGNOSIS — Z171 Estrogen receptor negative status [ER-]: Principal | ICD-10-CM

## 2018-05-25 DIAGNOSIS — C50812 Malignant neoplasm of overlapping sites of left female breast: Secondary | ICD-10-CM

## 2018-05-25 NOTE — Telephone Encounter (Signed)
Pt came in saying she wanted to know if SDJ would excuse her from work due to her issues shes still having from when she saw him back in September. I advised he will probably want to see her again since it's been so long and he can discuss then but wants to know if it is something she can do without an appointment.  I have made an appointment just in case. Please call pt.

## 2018-05-30 NOTE — Telephone Encounter (Signed)
Patient is returning missed call. Patient advise Dr. Glennon Mac isn't not in office message has been sent back for him to try to reach her at the end of the day next week when he is in the office. Please advise .Patient is still schedule 06/08/18

## 2018-06-04 NOTE — Telephone Encounter (Signed)
Does she need a call back?  If it is ok with her, i'll just see her 12/6.

## 2018-06-06 NOTE — Telephone Encounter (Signed)
Please make sure pt still has appt/keeps appt

## 2018-06-06 NOTE — Telephone Encounter (Signed)
Left voicemail for patient to call back to confirm appointment schedule

## 2018-06-08 ENCOUNTER — Ambulatory Visit (INDEPENDENT_AMBULATORY_CARE_PROVIDER_SITE_OTHER): Payer: Medicaid Other | Admitting: Obstetrics and Gynecology

## 2018-06-08 ENCOUNTER — Encounter: Payer: Self-pay | Admitting: Obstetrics and Gynecology

## 2018-06-08 VITALS — BP 122/74 | Ht 66.0 in | Wt 155.0 lb

## 2018-06-08 DIAGNOSIS — D251 Intramural leiomyoma of uterus: Secondary | ICD-10-CM | POA: Diagnosis not present

## 2018-06-08 DIAGNOSIS — D500 Iron deficiency anemia secondary to blood loss (chronic): Secondary | ICD-10-CM | POA: Diagnosis not present

## 2018-06-08 DIAGNOSIS — N921 Excessive and frequent menstruation with irregular cycle: Secondary | ICD-10-CM

## 2018-06-08 NOTE — Progress Notes (Signed)
Obstetrics & Gynecology Office Visit   Chief Complaint  Patient presents with  . Follow-up  heavy menstrual bleeding and fibroid uterus  History of Present Illness: 47 y.o. [id number] female who presents to discuss ongoing management of her heavy menstrual bleeding. She has an appointment with interventional radiology next week.  If a good candidate, she will undergo Uterine Artery Embolization.  In the mean time she would like to continue provera to help with her bleeding symptoms. She has lost her job due to her heavy menstrual bleeding, according to her, due to having to leave work for her symptoms.   Past Medical History:  Diagnosis Date  . Anemia   . Anxiety   . Cancer (HCC)    BREAST  . Depression   . Fibroid   . GERD (gastroesophageal reflux disease)     Past Surgical History:  Procedure Laterality Date  . BREAST LUMPECTOMY WITH AXILLARY LYMPH NODE BIOPSY Left 11/24/2017   Procedure: LEFT BREAST LUMPECTOMY WITH LEFT SENTINEL NODE LYMPH NODE BIOPSY;  Surgeon: Jovita Kussmaul, MD;  Location: [facility] ORS;  Service: General;  Laterality: Left;  . CESAREAN SECTION    . EYE SURGERY     DETACHED RETINA 4/519    Gynecologic History: Patient's last menstrual period was 06/05/2018.  Obstetric History: [id number]  Family History  Problem Relation Age of Onset  . Stroke Maternal Grandmother     Social History   Socioeconomic History  . Marital status: Divorced    Spouse name: Not on file  . Number of children: Not on file  . Years of education: Not on file  . Highest education level: Not on file  Occupational History  . Not on file  Social Needs  . Financial resource strain: Not on file  . Food insecurity:    Worry: Not on file    Inability: Not on file  . Transportation needs:    Medical: Not on file    Non-medical: Not on file  Tobacco Use  . Smoking status: Current Every Day Smoker  . Smokeless tobacco: Never Used  Substance and Sexual Activity  . Alcohol use: Yes    Comment: weekends  . Drug use: No  . Sexual activity: Yes    Birth control/protection: None  Lifestyle  . Physical activity:    Days per week: 7 days    Minutes per session: 20 min  . Stress: Only a little  Relationships  . Social connections:    Talks on phone: Once a week    Gets together: Once a week    Attends religious service: Never    Active member of club or organization: No    Attends meetings of clubs or organizations: Never    Relationship status: Divorced  . Intimate partner violence:    Fear of current or ex partner: No    Emotionally abused: No    Physically abused: No    Forced sexual activity: No  Other Topics Concern  . Not on file  Social History Narrative   ** Merged History Encounter **        Allergies  Allergen Reactions  . Demerol [Meperidine] Anaphylaxis    Pt does not tolerate pain killers.  [name] Percocet [Oxycodone-Acetaminophen] Anaphylaxis  . Sulfa Antibiotics Anaphylaxis    Prior to Admission medications   Medication Sig Start Date End Date Taking? Authorizing Provider  ALPRAZolam (XANAX) 0.25 MG tablet Take 1 tablet (0.25 mg total) by mouth 2 (two) times daily as needed for  anxiety. 09/05/17   Earlie Server, MD  cimetidine (TAGAMET HB) 200 MG tablet Take 200 mg by mouth daily as needed (acid reflux).    [provider]  diphenhydramine-acetaminophen (TYLENOL PM) 25-500 MG TABS tablet Take 2 tablets by mouth at bedtime as needed (sleep/pain).    [provider]  HYDROcodone-acetaminophen (NORCO/VICODIN) 5-325 MG tablet Take 1-2 tablets by mouth every 6 (six) hours as needed for moderate pain or severe pain. 11/24/17   Jovita Kussmaul, MD  ibuprofen (ADVIL,MOTRIN) 600 MG tablet Take 1 tablet by mouth three times daily with meals 01/20/18   Hinda Kehr, MD  medroxyPROGESTERone (PROVERA) 10 MG tablet Take 1 tablet (10 mg total) by mouth 3 (three) times daily as needed for up to 7 days (heavy menstrual bleeding). 03/14/18 03/21/18  Will Bonnet, MD  Multiple Vitamin (MULTIVITAMIN WITH MINERALS) TABS tablet Take 1 tablet by mouth daily.    [provider]  potassium chloride (K-DUR) 10 MEQ tablet Take 1 tablet (10 mEq total) by mouth daily. 04/28/18   Earlie Server, MD  senna-docusate (SENOKOT-S) 8.6-50 MG tablet Take 2 tablets by mouth daily. 01/26/18   Earlie Server, MD  traMADol (ULTRAM) 50 MG tablet Take 1-2 tablets (50-100 mg total) by mouth every 6 (six) hours as needed. Patient not taking: Reported on 04/27/2018 01/20/18   Hinda Kehr, MD    Review of Systems  Constitutional: Negative.   HENT: Negative.   Eyes: Negative.   Respiratory: Negative.   Cardiovascular: Negative.   Gastrointestinal: Negative.   Genitourinary: Negative.   Musculoskeletal: Negative.   Skin: Negative.   Neurological: Negative.   Psychiatric/Behavioral: Negative.      Physical Exam BP 122/74   Ht 5\' 6"  (1.676 m)   Wt 155 lb (70.3 kg)   LMP 06/05/2018   BMI 25.02 kg/m  Patient's last menstrual period was 06/05/2018. Physical Exam  Constitutional: She is oriented to person, place, and time. She appears well-developed and well-nourished. No distress.  HENT:  Head: Normocephalic and atraumatic.  Eyes: Conjunctivae are normal. No scleral icterus.  Pulmonary/Chest: No respiratory distress.  Musculoskeletal: Normal range of motion. She exhibits no edema.  Neurological: She is alert and oriented to person, place, and time. No cranial nerve deficit.  Skin: Skin is warm and dry. No erythema.  Psychiatric: She has a normal mood and affect. Her behavior is normal. Judgment normal.   Assessment: 47 y.o. [id number] female here for  1. Intramural leiomyoma of uterus   2. Iron deficiency anemia due to chronic blood loss   3. Menorrhagia with irregular cycle      Plan: Problem List Items Addressed This Visit      Genitourinary   Fibroid uterus - Primary   Relevant Medications   medroxyPROGESTERone (PROVERA) 10 MG tablet     Other    Iron deficiency anemia   Relevant Medications   medroxyPROGESTERone (PROVERA) 10 MG tablet   Menorrhagia with irregular cycle   Relevant Medications   medroxyPROGESTERone (PROVERA) 10 MG tablet     Prescription for Provera to minimize bleeding until she has had her consultation and/or procedure.    She requests a note for work, even though she is no longer employed.  Will provide her with a letter.   15 minutes spent in face to face discussion with > 50% spent in counseling,management, and coordination of care of her menorrhagia with irregular cycle, anemia due to chronic blood loss, and fibroid uterus with intramural fibroids.   Annie Main  Glennon Mac, MD 06/08/2018 4:23 PM

## 2018-06-11 ENCOUNTER — Encounter: Payer: Self-pay | Admitting: Obstetrics and Gynecology

## 2018-06-11 MED ORDER — MEDROXYPROGESTERONE ACETATE 10 MG PO TABS: 20.0000 mg | ORAL_TABLET | Freq: Every day | ORAL | 0 refills | Status: DC

## 2018-06-14 ENCOUNTER — Encounter: Payer: Self-pay | Admitting: *Deleted

## 2018-06-14 NOTE — Progress Notes (Signed)
  Oncology Nurse Navigator Documentation  Navigator Location: [facility] (06/14/18 1500)   )Navigator Encounter Type: Telephone (06/14/18 1500) Telephone: Lahoma Crocker Call (06/14/18 1500)                                                  Time Spent with Patient: 15 (06/14/18 1500)   Called and informed patient that I could not renew her [place] Medicaid because her treatment for breast cancer was complete.  She states she has surgery planned.  Informed patient the Medicaid is good until 08/03/18.  Informed patient she could apply for regular medicaid, but [employer] Medicaid is only for the duration of treatment for breast or cervical cancer.

## 2018-07-03 DIAGNOSIS — D251 Intramural leiomyoma of uterus: Secondary | ICD-10-CM

## 2018-08-29 ENCOUNTER — Other Ambulatory Visit: Payer: Self-pay

## 2018-10-17 ENCOUNTER — Encounter: Payer: Self-pay | Admitting: Oncology

## 2018-11-28 ENCOUNTER — Encounter: Payer: Self-pay | Admitting: *Deleted

## 2018-11-28 NOTE — Progress Notes (Signed)
  Oncology Nurse Navigator Documentation  Navigator Location: [facility] (11/28/18 1100)   )Navigator Encounter Type: Telephone (11/28/18 1100) Telephone: Outgoing Call (11/28/18 1100)                                                  Time Spent with Patient: 15 (11/28/18 1100)   Patient called and left a message that she wanted to get back in to see Dr. Tasia Catchings for her Iron infusions.  Tried to call her back.  Left message for her to return my call.  I will send message to Dr. Collie Siad team to work on getting her an appointment.  I added her new cell number to the demographics sheet.

## 2018-12-26 ENCOUNTER — Other Ambulatory Visit: Payer: Self-pay

## 2018-12-26 ENCOUNTER — Inpatient Hospital Stay: Payer: BC Managed Care – PPO | Attending: Oncology

## 2018-12-26 DIAGNOSIS — C50812 Malignant neoplasm of overlapping sites of left female breast: Secondary | ICD-10-CM

## 2018-12-26 DIAGNOSIS — Z171 Estrogen receptor negative status [ER-]: Secondary | ICD-10-CM

## 2018-12-26 DIAGNOSIS — Z791 Long term (current) use of non-steroidal anti-inflammatories (NSAID): Secondary | ICD-10-CM | POA: Diagnosis not present

## 2018-12-26 DIAGNOSIS — Z79899 Other long term (current) drug therapy: Secondary | ICD-10-CM | POA: Insufficient documentation

## 2018-12-26 DIAGNOSIS — E041 Nontoxic single thyroid nodule: Secondary | ICD-10-CM | POA: Diagnosis not present

## 2018-12-26 DIAGNOSIS — E876 Hypokalemia: Secondary | ICD-10-CM | POA: Insufficient documentation

## 2018-12-26 DIAGNOSIS — F17210 Nicotine dependence, cigarettes, uncomplicated: Secondary | ICD-10-CM | POA: Insufficient documentation

## 2018-12-26 DIAGNOSIS — N920 Excessive and frequent menstruation with regular cycle: Secondary | ICD-10-CM | POA: Diagnosis not present

## 2018-12-26 DIAGNOSIS — Z793 Long term (current) use of hormonal contraceptives: Secondary | ICD-10-CM | POA: Insufficient documentation

## 2018-12-26 DIAGNOSIS — D500 Iron deficiency anemia secondary to blood loss (chronic): Secondary | ICD-10-CM | POA: Diagnosis not present

## 2018-12-26 DIAGNOSIS — D259 Leiomyoma of uterus, unspecified: Secondary | ICD-10-CM | POA: Insufficient documentation

## 2018-12-26 LAB — CBC WITH DIFFERENTIAL/PLATELET
Abs Immature Granulocytes: 0.03 10*3/uL (ref 0.00–0.07)
Basophils Absolute: 0 10*3/uL (ref 0.0–0.1)
Basophils Relative: 1 %
Eosinophils Absolute: 0.2 10*3/uL (ref 0.0–0.5)
Eosinophils Relative: 3 %
HCT: 29.6 % — ABNORMAL LOW (ref 36.0–46.0)
Hemoglobin: 8.6 g/dL — ABNORMAL LOW (ref 12.0–15.0)
Immature Granulocytes: 1 %
Lymphocytes Relative: 26 %
Lymphs Abs: 1.4 10*3/uL (ref 0.7–4.0)
MCH: 24 pg — ABNORMAL LOW (ref 26.0–34.0)
MCHC: 29.1 g/dL — ABNORMAL LOW (ref 30.0–36.0)
MCV: 82.5 fL (ref 80.0–100.0)
Monocytes Absolute: 0.6 10*3/uL (ref 0.1–1.0)
Monocytes Relative: 11 %
Neutro Abs: 3.2 10*3/uL (ref 1.7–7.7)
Neutrophils Relative %: 58 %
Platelets: 280 10*3/uL (ref 150–400)
RBC: 3.59 MIL/uL — ABNORMAL LOW (ref 3.87–5.11)
RDW: 19.5 % — ABNORMAL HIGH (ref 11.5–15.5)
WBC: 5.5 10*3/uL (ref 4.0–10.5)
nRBC: 0 % (ref 0.0–0.2)

## 2018-12-26 LAB — IRON AND TIBC
Iron: 12 ug/dL — ABNORMAL LOW (ref 28–170)
Saturation Ratios: 3 % — ABNORMAL LOW (ref 10.4–31.8)
TIBC: 437 ug/dL (ref 250–450)
UIBC: 425 ug/dL

## 2018-12-26 LAB — FERRITIN: Ferritin: 5 ng/mL — ABNORMAL LOW (ref 11–307)

## 2018-12-28 ENCOUNTER — Inpatient Hospital Stay (HOSPITAL_BASED_OUTPATIENT_CLINIC_OR_DEPARTMENT_OTHER): Payer: BC Managed Care – PPO | Admitting: Oncology

## 2018-12-28 ENCOUNTER — Encounter: Payer: Self-pay | Admitting: Oncology

## 2018-12-28 ENCOUNTER — Inpatient Hospital Stay: Payer: BC Managed Care – PPO

## 2018-12-28 ENCOUNTER — Other Ambulatory Visit: Payer: Self-pay

## 2018-12-28 VITALS — BP 123/73 | HR 103 | Temp 99.1°F | Wt 151.6 lb

## 2018-12-28 VITALS — BP 122/83 | HR 73 | Resp 18

## 2018-12-28 DIAGNOSIS — F17210 Nicotine dependence, cigarettes, uncomplicated: Secondary | ICD-10-CM

## 2018-12-28 DIAGNOSIS — E041 Nontoxic single thyroid nodule: Secondary | ICD-10-CM

## 2018-12-28 DIAGNOSIS — C50812 Malignant neoplasm of overlapping sites of left female breast: Secondary | ICD-10-CM

## 2018-12-28 DIAGNOSIS — D500 Iron deficiency anemia secondary to blood loss (chronic): Secondary | ICD-10-CM | POA: Diagnosis not present

## 2018-12-28 DIAGNOSIS — Z793 Long term (current) use of hormonal contraceptives: Secondary | ICD-10-CM

## 2018-12-28 DIAGNOSIS — N920 Excessive and frequent menstruation with regular cycle: Secondary | ICD-10-CM

## 2018-12-28 DIAGNOSIS — E876 Hypokalemia: Secondary | ICD-10-CM

## 2018-12-28 DIAGNOSIS — D259 Leiomyoma of uterus, unspecified: Secondary | ICD-10-CM

## 2018-12-28 DIAGNOSIS — Z171 Estrogen receptor negative status [ER-]: Secondary | ICD-10-CM

## 2018-12-28 DIAGNOSIS — Z79899 Other long term (current) drug therapy: Secondary | ICD-10-CM

## 2018-12-28 DIAGNOSIS — Z791 Long term (current) use of non-steroidal anti-inflammatories (NSAID): Secondary | ICD-10-CM

## 2018-12-28 MED ORDER — IRON SUCROSE 20 MG/ML IV SOLN
200.0000 mg | Freq: Once | INTRAVENOUS | Status: AC
  Administered 2018-12-28: 200 mg via INTRAVENOUS
  Filled 2018-12-28: qty 10

## 2018-12-28 MED ORDER — SODIUM CHLORIDE 0.9 % IV SOLN
Freq: Once | INTRAVENOUS | Status: AC
  Administered 2018-12-28: 14:00:00 via INTRAVENOUS
  Filled 2018-12-28: qty 250

## 2018-12-28 NOTE — Progress Notes (Signed)
Hematology/Oncology Follow up note [facility] [phone] [phone]   Patient Care Team: Patient, No Pcp Per as PCP - General (General Practice) Patient, No Pcp Per (General Practice) REASON FOR VISIT Follow up for treatment of triple negative breast cancer and iron deficiency anemia.     HISTORY OF PRESENTING ILLNESS:  Cindy Brewer is a  48 y.o.  female with PMH listed below who was referred to me for evaluation of newly diagnosed breast cancer.  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 two separate foci of invasive mammary carcinoma with associated tumor necrosis, DCIS present, fibroadenoma 49m, Foci 1 is 486m and foci 2 is 3539mmargins are negative. Sentinel LN 0/0 involved. Negative LVI declined neoadjuvant or adjuvant chemotherapy  # Genetic testing: BRCA neg Testing did not reveal a pathogenic mutation in any of the genes analyzed.  #Iron deficiency anemia status post multiple IV iron infusions. # Uterus Fibroid disease;  underwent endometrial biopsy    INTERVAL HISTORY Cindy Brewer a 47 63o. female who has above history reviewed by me today presents for follow up visit for Triple negative breast cancer and iron deficiency anemia   # Feel tired and fatigued. Heavy menstrual period.  She has not followed up with Gyn recently.   # Denies any bone pain, new concerns of breasts.  Previously no showed to mammogram.   .  Review of Systems  Constitutional: Positive for fatigue. Negative for appetite change, chills and fever.  HENT:   Negative for hearing loss and voice change.   Eyes: Negative for eye problems.  Respiratory: Negative for chest tightness and cough.   Cardiovascular: Negative for chest pain.  Gastrointestinal: Negative for abdominal distention, abdominal pain and blood in stool.   Endocrine: Negative for hot flashes.  Genitourinary: Negative for difficulty urinating and frequency.   Musculoskeletal: Negative for arthralgias.  Skin: Negative for itching and rash.  Neurological: Negative for extremity weakness.  Hematological: Negative for adenopathy.  Psychiatric/Behavioral: Negative for confusion.     MEDICAL HISTORY:  Past Medical History:  Diagnosis Date  . Anemia   . Anxiety   . Cancer (HCC)    BREAST  . Depression   . Fibroid   . GERD (gastroesophageal reflux disease)     SURGICAL HISTORY: Past Surgical History:  Procedure Laterality Date  . BREAST LUMPECTOMY WITH AXILLARY LYMPH NODE BIOPSY Left 11/24/2017   Procedure: LEFT BREAST LUMPECTOMY WITH LEFT SENTINEL NODE LYMPH NODE BIOPSY;  Surgeon: TotJovita KussmaulD;  Location: [facility] ORS;  Service: General;  Laterality: Left;  . CESAREAN SECTION    . EYE SURGERY     DETACHED RETINA 4/519    SOCIAL HISTORY: Social History   Socioeconomic History  . Marital status: Divorced    Spouse name: Not on file  . Number of children: Not on file  . Years of education: Not on file  . Highest education level: Not on file  Occupational History  . Not on file  Social Needs  . Financial resource strain: Not on file  . Food insecurity    Worry: Not on file    Inability: Not on file  . Transportation needs    Medical: Not on file    Non-medical: Not on file  Tobacco Use  . Smoking status: Current Every Day Smoker  . Smokeless tobacco: Never Used  Substance and Sexual Activity  .  Alcohol use: Yes    Comment: weekends  . Drug use: No  . Sexual activity: Yes    Birth control/protection: None  Lifestyle  . Physical activity    Days per week: 7 days    Minutes per session: 20 min  . Stress: Only a little  Relationships  . Social Herbalist on phone: Once a week    Gets together: Once a week    Attends religious service: Never    Active member of club or organization: No    Attends  meetings of clubs or organizations: Never    Relationship status: Divorced  . Intimate partner violence    Fear of current or ex partner: No    Emotionally abused: No    Physically abused: No    Forced sexual activity: No  Other Topics Concern  . Not on file  Social History Narrative   ** Merged History Encounter **        FAMILY HISTORY: Family History  Problem Relation Age of Onset  . Stroke Maternal Grandmother     ALLERGIES:  is allergic to demerol [meperidine]; percocet [oxycodone-acetaminophen]; and sulfa antibiotics.  MEDICATIONS:  Current Outpatient Medications  Medication Sig Dispense Refill  . ALPRAZolam (XANAX) 0.25 MG tablet Take 1 tablet (0.25 mg total) by mouth 2 (two) times daily as needed for anxiety. 30 tablet 0  . cimetidine (TAGAMET HB) 200 MG tablet Take 200 mg by mouth daily as needed (acid reflux).    [name] diphenhydramine-acetaminophen (TYLENOL PM) 25-500 MG TABS tablet Take 2 tablets by mouth at bedtime as needed (sleep/pain).    [name] HYDROcodone-acetaminophen (NORCO/VICODIN) 5-325 MG tablet Take 1-2 tablets by mouth every 6 (six) hours as needed for moderate pain or severe pain. 15 tablet 0  . ibuprofen (ADVIL,MOTRIN) 600 MG tablet Take 1 tablet by mouth three times daily with meals 15 tablet 0  . medroxyPROGESTERone (PROVERA) 10 MG tablet Take 2 tablets (20 mg total) by mouth daily. 60 tablet 0  . Multiple Vitamin (MULTIVITAMIN WITH MINERALS) TABS tablet Take 1 tablet by mouth daily.    . potassium chloride (K-DUR) 10 MEQ tablet Take 1 tablet (10 mEq total) by mouth daily. 30 tablet 0  . senna-docusate (SENOKOT-S) 8.6-50 MG tablet Take 2 tablets by mouth daily. 30 tablet 0  . traMADol (ULTRAM) 50 MG tablet Take 1-2 tablets (50-100 mg total) by mouth every 6 (six) hours as needed. (Patient not taking: Reported on 04/27/2018) 20 tablet 1   No current facility-administered medications for this visit.      PHYSICAL EXAMINATION: ECOG PERFORMANCE STATUS: 1 -  Symptomatic but completely ambulatory Vitals:   12/28/18 1324  BP: 123/73  Pulse: (!) 103  Temp: 99.1 F (37.3 C)   Filed Weights   12/28/18 1324  Weight: 151 lb 9 oz (68.7 kg)    Physical Exam  Constitutional: She is oriented to person, place, and time. No distress.  HENT:  Head: Normocephalic and atraumatic.  Nose: Nose normal.  Mouth/Throat: Oropharynx is clear and moist. No oropharyngeal exudate.  Eyes: Pupils are equal, round, and reactive to light. EOM are normal. Left eye exhibits no discharge. No scleral icterus.  Neck: Normal range of motion. Neck supple. No JVD present.  Cardiovascular: Normal rate, regular rhythm and normal heart sounds.  No murmur heard. Pulmonary/Chest: Effort normal and breath sounds normal. No respiratory distress. She has no wheezes. She has no rales. She exhibits no tenderness.  Abdominal: Soft. Bowel sounds are  normal. She exhibits no distension and no mass. There is no abdominal tenderness. There is no rebound.  Musculoskeletal: Normal range of motion.        General: No tenderness or edema.  Lymphadenopathy:    She has no cervical adenopathy.  Neurological: She is alert and oriented to person, place, and time. No cranial nerve deficit. She exhibits normal muscle tone. Coordination normal.  Skin: Skin is warm and dry. No rash noted. She is not diaphoretic. No erythema.  Psychiatric: Memory, affect and judgment normal.  Breast exam was performed in seated and lying down position. Patient is status post left lumpectomy with a well-healing surgical scar. No evidence of any palpable masses. No evidence of axillary adenopathy. No evidence of any palpable masses or lumps in the right breast. No evidence of right axillary adenopathy       LABORATORY DATA:  I have reviewed the data as listed Lab Results  Component Value Date   WBC 5.5 12/26/2018   HGB 8.6 (L) 12/26/2018   HCT 29.6 (L) 12/26/2018   MCV 82.5 12/26/2018   PLT 280 12/26/2018    Recent Labs    01/26/18 1044 03/28/18 1420 04/27/18 1257  NA 136 141 141  K 4.0 4.4 3.2*  CL 105 108 107  CO2 _0 GLUCOSE 101* 87 108*  BUN _1 CREATININE 0.72 0.61 0.64  CALCIUM 8.8* 8.6* 9.2  GFRNONAA >60 >60 >60  GFRAA >60 >60 >60  PROT 7.6 7.3 7.4  ALBUMIN 3.7 3.8 3.9  AST 13* 14* 14*  ALT _2 ALKPHOS 34* 32* 32*  BILITOT 0.2* 0.4 0.6    CT renal scan 01/20/2018  independently reviewed by me.  No evidence of metastasis.    ASSESSMENT & PLAN:  Cancer Staging Malignant neoplasm of overlapping sites of left breast in female, estrogen receptor negative ([place]) Staging form: Breast, AJCC 8th Edition - Clinical stage from 08/25/2017: Stage IIB (cT2(2), cN0, cM0, G3, ER: Negative, PR: Negative, HER2: Negative) - Signed by Earlie Server, MD on 08/25/2017  1. Malignant neoplasm of overlapping sites of left breast in female, estrogen receptor negative ([place])   2. Iron deficiency anemia due to chronic blood loss    # 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 High recurrence risk.  Previously no-show to mammogram. She is overdue for diagnostic mammogram.  Will obtain. Discussed with patient about importance of surveillance. .   # Iron deficiency anemia due to ongoing menorrhagia.  Labs are reviewed and discussed with patient. Consistent with severe iron deficiency. Recommend proceed IV Venofer 200 mg x 4  #Uterus fibroid disease/menorrhagia, recommend patient to follow-up with GYN for further management.  # Thyroid nodule, previously ordered thyroid ultrasound not done. # hypokalemia, K-dur 19mq daily.   Return of visit:  8 weeks with repeat labs.  Orders Placed This Encounter  Procedures  . CBC with Differential/Platelet    Standing Status:   Future    Standing Expiration Date:   12/28/2019  . Ferritin    Standing Status:   Future    Standing Expiration Date:   12/28/2019  . Iron and TIBC     Standing Status:   Future    Standing Expiration Date:   12/28/2019   We spent sufficient time to discuss many aspect of care, questions were answered to patient's satisfaction. Total face to face encounter time for this patient visit was 25 min. >50% of  the time was  spent in counseling and coordination of care.     Earlie Server, MD, PhD 12/28/2018

## 2018-12-28 NOTE — Progress Notes (Signed)
Patient here today for follow up.  Patient c/o fatigue, heart palpitations and SOB.

## 2019-01-02 ENCOUNTER — Encounter: Payer: Self-pay | Admitting: Pharmacy Technician

## 2019-01-02 NOTE — Progress Notes (Signed)
Patient has been approved for drug assistance by [employer] for Venofer. The enrollment period is from 01/01/19-12/31/19 based on self pay. First DOS covered is 01/03/2019.

## 2019-01-03 ENCOUNTER — Inpatient Hospital Stay: Payer: BC Managed Care – PPO | Attending: Oncology

## 2019-01-03 ENCOUNTER — Other Ambulatory Visit: Payer: Self-pay

## 2019-01-03 VITALS — BP 130/86 | HR 78 | Temp 98.0°F | Resp 18

## 2019-01-03 DIAGNOSIS — D509 Iron deficiency anemia, unspecified: Secondary | ICD-10-CM | POA: Insufficient documentation

## 2019-01-03 DIAGNOSIS — C50812 Malignant neoplasm of overlapping sites of left female breast: Secondary | ICD-10-CM

## 2019-01-03 DIAGNOSIS — Z171 Estrogen receptor negative status [ER-]: Secondary | ICD-10-CM

## 2019-01-03 MED ORDER — IRON SUCROSE 20 MG/ML IV SOLN
200.0000 mg | Freq: Once | INTRAVENOUS | Status: AC
  Administered 2019-01-03: 14:00:00 200 mg via INTRAVENOUS
  Filled 2019-01-03: qty 10

## 2019-01-03 MED ORDER — SODIUM CHLORIDE 0.9 % IV SOLN
INTRAVENOUS | Status: DC
  Administered 2019-01-03: 14:00:00 via INTRAVENOUS
  Filled 2019-01-03: qty 250

## 2019-01-07 ENCOUNTER — Emergency Department: Payer: BC Managed Care – PPO

## 2019-01-07 ENCOUNTER — Emergency Department
Admission: EM | Admit: 2019-01-07 | Discharge: 2019-01-07 | Disposition: A | Discharge status: Home / Self Care | Payer: BC Managed Care – PPO | Attending: Emergency Medicine | Admitting: Emergency Medicine

## 2019-01-07 ENCOUNTER — Encounter: Payer: Self-pay | Admitting: Emergency Medicine

## 2019-01-07 ENCOUNTER — Other Ambulatory Visit: Payer: Self-pay

## 2019-01-07 DIAGNOSIS — Y998 Other external cause status: Secondary | ICD-10-CM | POA: Diagnosis not present

## 2019-01-07 DIAGNOSIS — IMO0002 Reserved for concepts with insufficient information to code with codable children: Secondary | ICD-10-CM

## 2019-01-07 DIAGNOSIS — F17210 Nicotine dependence, cigarettes, uncomplicated: Secondary | ICD-10-CM | POA: Insufficient documentation

## 2019-01-07 DIAGNOSIS — S6991XA Unspecified injury of right wrist, hand and finger(s), initial encounter: Secondary | ICD-10-CM | POA: Diagnosis present

## 2019-01-07 DIAGNOSIS — S63609A Unspecified sprain of unspecified thumb, initial encounter: Secondary | ICD-10-CM

## 2019-01-07 DIAGNOSIS — Y9289 Other specified places as the place of occurrence of the external cause: Secondary | ICD-10-CM | POA: Diagnosis not present

## 2019-01-07 DIAGNOSIS — X509XXA Other and unspecified overexertion or strenuous movements or postures, initial encounter: Secondary | ICD-10-CM | POA: Diagnosis not present

## 2019-01-07 DIAGNOSIS — Z79899 Other long term (current) drug therapy: Secondary | ICD-10-CM | POA: Diagnosis not present

## 2019-01-07 DIAGNOSIS — S63601A Unspecified sprain of right thumb, initial encounter: Secondary | ICD-10-CM | POA: Insufficient documentation

## 2019-01-07 DIAGNOSIS — Y9389 Activity, other specified: Secondary | ICD-10-CM | POA: Diagnosis not present

## 2019-01-07 MED ORDER — NAPROXEN 500 MG PO TABS: 500.0000 mg | ORAL_TABLET | Freq: Two times a day (BID) | ORAL | 0 refills | Status: DC

## 2019-01-07 NOTE — ED Notes (Signed)
See triage note    Presents with pain to right thumb   States pain started yesterday w/o injury  No deformity noted

## 2019-01-07 NOTE — ED Provider Notes (Signed)
The Cindy Brewer ____________________________________________  Time seen: Approximately 12:19 PM  I have reviewed the triage vital signs and the nursing notes.   HISTORY  Chief Complaint Hand Pain    HPI Cindy Brewer is a 48 y.o. female who presents to the emergency department for evaluation and treatment of right thumb pain. No known injury. Symptoms started yesterday and have progressively worsened. No relief with rest and ice.  Past Medical History:  Diagnosis Date  . Anemia   . Anxiety   . Cancer (HCC)    BREAST  . Depression   . Fibroid   . GERD (gastroesophageal reflux disease)     Patient Active Problem List   Diagnosis Date Noted  . Fibroid uterus 03/08/2018  . Menorrhagia with irregular cycle 03/08/2018  . Iron deficiency anemia 02/28/2018  . Malignant neoplasm of overlapping sites of left breast in female, estrogen receptor negative ([place]) 08/25/2017    Past Surgical History:  Procedure Laterality Date  . BREAST LUMPECTOMY WITH AXILLARY LYMPH NODE BIOPSY Left 11/24/2017   Procedure: LEFT BREAST LUMPECTOMY WITH LEFT SENTINEL NODE LYMPH NODE BIOPSY;  Surgeon: Cindy Brewer;  Location: [facility] ORS;  Service: General;  Laterality: Left;  . CESAREAN SECTION    . EYE SURGERY     DETACHED RETINA 4/519    Prior to Admission medications   Medication Sig Start Date End Date Taking? Authorizing Provider  ALPRAZolam (XANAX) 0.25 MG tablet Take 1 tablet (0.25 mg total) by mouth 2 (two) times daily as needed for anxiety. 09/05/17   Earlie Server, Brewer  cimetidine (TAGAMET HB) 200 MG tablet Take 200 mg by mouth daily as needed (acid reflux).    Provider, Historical, Brewer  diphenhydramine-acetaminophen (TYLENOL PM) 25-500 MG TABS tablet Take 2 tablets by mouth at bedtime as needed (sleep/pain).    Provider, Historical, Brewer  HYDROcodone-acetaminophen (NORCO/VICODIN) 5-325 MG tablet Take 1-2 tablets by mouth every 6 (six)  hours as needed for moderate pain or severe pain. 11/24/17   Cindy Brewer  ibuprofen (ADVIL,MOTRIN) 600 MG tablet Take 1 tablet by mouth three times daily with meals 01/20/18   Hinda Kehr, Brewer  medroxyPROGESTERone (PROVERA) 10 MG tablet Take 2 tablets (20 mg total) by mouth daily. 06/11/18   Will Bonnet, Brewer  Multiple Vitamin (MULTIVITAMIN WITH MINERALS) TABS tablet Take 1 tablet by mouth daily.    Provider, Historical, Brewer  potassium chloride (K-DUR) 10 MEQ tablet Take 1 tablet (10 mEq total) by mouth daily. 04/28/18   Earlie Server, Brewer  senna-docusate (SENOKOT-S) 8.6-50 MG tablet Take 2 tablets by mouth daily. 01/26/18   Earlie Server, Brewer  traMADol (ULTRAM) 50 MG tablet Take 1-2 tablets (50-100 mg total) by mouth every 6 (six) hours as needed. 01/20/18   Hinda Kehr, Brewer    Allergies Demerol [meperidine], Percocet [oxycodone-acetaminophen], and Sulfa antibiotics  Family History  Problem Relation Age of Onset  . Stroke Maternal Grandmother     Social History Social History   Tobacco Use  . Smoking status: Current Every Day Smoker  . Smokeless tobacco: Never Used  Substance Use Topics  . Alcohol use: Yes    Comment: weekends  . Drug use: No    Review of Systems Constitutional: Negative for fever. Cardiovascular: Negative for chest pain. Respiratory: Negative for shortness of breath. Musculoskeletal: Positive for right thumb pain. Skin: Positive for swelling in the right thumb at PIP.  Neurological: Negative for decrease in sensation  ____________________________________________  PHYSICAL EXAM:  VITAL SIGNS: ED Triage Vitals  Enc Vitals Group     BP 01/07/19 1212 (!) 141/89     Pulse Rate 01/07/19 1212 86     Resp 01/07/19 1212 18     Temp 01/07/19 1212 98.8 F (37.1 C)     Temp Source 01/07/19 1212 Oral     SpO2 01/07/19 1212 98 %     Weight 01/07/19 1159 151 lb (68.5 kg)     Height 01/07/19 1159 5\' 6"  (1.676 m)     Head Circumference --      Peak Flow --       Pain Score 01/07/19 1159 7     Pain Loc --      Pain Edu? --      Excl. in [place]? --     Constitutional: Alert and oriented. Well appearing and in no acute distress. Eyes: Conjunctivae are clear without discharge or drainage Head: Atraumatic Neck: Supple.  Respiratory: No cough. Respirations are even and unlabored. Musculoskeletal: Limited flexion and extension of the right thumb due to pain and swelling. No deformity. Neurologic: Motor and sensory function is intact.  Skin: Mild swelling over the PIP. Finger pad is soft and nontender. No paronychia noted.  Psychiatric: Affect and behavior are appropriate.  ____________________________________________   LABS (all labs ordered are listed, but only abnormal results are displayed)  Labs Reviewed - No data to display ____________________________________________  RADIOLOGY  Image of the right thumb shows no evidence of fracture, dislocation, or retained foreign body. ____________________________________________   PROCEDURES  Procedures  ____________________________________________   INITIAL IMPRESSION / ASSESSMENT AND PLAN / ED COURSE  Cindy Brewer is a 48 y.o. who presents to the emergency department for treatment and evaluation of right thumb pain.  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  X-ray of the thumb is negative.  She is likely strained her thumb and will be placed in a thumb splint.  She will be given a prescription for Naprosyn and encouraged to follow-up with orthopedics if not improving over the week.  She was encouraged to return to the emergency department for symptoms of change or worsen if she is unable to schedule an appointment.  Medications - No data to display  Pertinent labs & imaging results that were available during my care of the patient were reviewed by me and considered in my medical decision making (see chart for  details).  _________________________________________   FINAL CLINICAL IMPRESSION(S) / ED DIAGNOSES  Final diagnoses:  None    ED Discharge Orders    None       If controlled substance prescribed during this visit, 12 month history viewed on the [place] prior to issuing an initial prescription for Schedule II or III opiod.   Victorino Dike, FNP 01/07/19 1622    Nena Polio, Brewer 01/07/19 2119

## 2019-01-07 NOTE — Discharge Instructions (Signed)
Take the anti-inflammatory as prescribed.  Wear the splint for the next few days. If pain and swelling persists, follow up with orthopedics.  Return to the ER for symptoms that change or worsen if unable to schedule an appointment.

## 2019-01-07 NOTE — ED Triage Notes (Signed)
Pt states last pm started with pain to her right hand thumb. Pt denies injuries.

## 2019-01-08 ENCOUNTER — Other Ambulatory Visit: Payer: Medicaid Other

## 2019-01-11 ENCOUNTER — Encounter: Payer: Self-pay | Admitting: Pharmacy Technician

## 2019-01-11 ENCOUNTER — Inpatient Hospital Stay: Payer: BC Managed Care – PPO

## 2019-01-11 ENCOUNTER — Other Ambulatory Visit: Payer: Self-pay

## 2019-01-11 VITALS — BP 142/84 | HR 78 | Resp 18

## 2019-01-11 DIAGNOSIS — D509 Iron deficiency anemia, unspecified: Secondary | ICD-10-CM | POA: Diagnosis not present

## 2019-01-11 DIAGNOSIS — C50812 Malignant neoplasm of overlapping sites of left female breast: Secondary | ICD-10-CM

## 2019-01-11 MED ORDER — SODIUM CHLORIDE 0.9 % IV SOLN
INTRAVENOUS | Status: DC
  Administered 2019-01-11: 14:00:00 via INTRAVENOUS
  Filled 2019-01-11 (×2): qty 250

## 2019-01-11 MED ORDER — IRON SUCROSE 20 MG/ML IV SOLN
200.0000 mg | Freq: Once | INTRAVENOUS | Status: AC
  Administered 2019-01-11: 200 mg via INTRAVENOUS
  Filled 2019-01-11: qty 10

## 2019-01-11 NOTE — Progress Notes (Signed)
Patient states she has Medicaid Family Planning and provided [place] prescription card that is good for retail or mail order only. Neither of these cards cover her Venofer injections so we will proceed with patient assistance until new insurance information is provided.

## 2019-01-17 ENCOUNTER — Other Ambulatory Visit: Payer: Medicaid Other

## 2019-01-18 ENCOUNTER — Encounter: Payer: Self-pay | Admitting: Pharmacy Technician

## 2019-01-18 ENCOUNTER — Inpatient Hospital Stay: Payer: BC Managed Care – PPO

## 2019-01-18 NOTE — Progress Notes (Signed)
Patient no longer getting Venofer from [employer] based on BCBS coverage. No dates of service need covered.

## 2019-01-30 ENCOUNTER — Ambulatory Visit
Admission: RE | Admit: 2019-01-30 | Discharge: 2019-01-30 | Disposition: A | Discharge status: Home / Self Care | Payer: BC Managed Care – PPO | Source: Ambulatory Visit | Attending: Oncology | Admitting: Oncology

## 2019-01-30 DIAGNOSIS — C50812 Malignant neoplasm of overlapping sites of left female breast: Secondary | ICD-10-CM | POA: Insufficient documentation

## 2019-01-30 DIAGNOSIS — Z171 Estrogen receptor negative status [ER-]: Secondary | ICD-10-CM | POA: Insufficient documentation

## 2019-02-14 ENCOUNTER — Encounter: Payer: Self-pay | Admitting: Obstetrics and Gynecology

## 2019-02-14 ENCOUNTER — Other Ambulatory Visit: Payer: Self-pay

## 2019-02-14 ENCOUNTER — Ambulatory Visit (INDEPENDENT_AMBULATORY_CARE_PROVIDER_SITE_OTHER): Payer: BC Managed Care – PPO | Admitting: Obstetrics and Gynecology

## 2019-02-14 VITALS — BP 147/91 | Wt 155.0 lb

## 2019-02-14 DIAGNOSIS — D251 Intramural leiomyoma of uterus: Secondary | ICD-10-CM

## 2019-02-14 DIAGNOSIS — D500 Iron deficiency anemia secondary to blood loss (chronic): Secondary | ICD-10-CM

## 2019-02-14 DIAGNOSIS — N921 Excessive and frequent menstruation with irregular cycle: Secondary | ICD-10-CM

## 2019-02-14 NOTE — Progress Notes (Signed)
Obstetrics & Gynecology Office Visit   Chief Complaint  Patient presents with  . Follow-up  heavy menstrual bleeding and fibroid uterus, resulting in anemia  History of Present Illness: 48 y.o. [id number] female who presents in follow up for heavy menstrual bleeding and pelvic pain. She states that she underwent uterine fibroid embolization late last year.  She had several months with minimal bleeding.  Then, in April or May she began to have heavy bleeding again with associated pain.  She has been passing clots.  She is unable to wear anything but dark-colored clothing.  Due to her bleeding, she has had to miss work and has been out of work for the past month.  She presents inquiring into her options of treatment at this point. (note: her Uterine Fibroid Embolization occurred at [facility].  See scanned documentation of this under the "Media" tab dated 07/25/2018. The date of her procedure was 07/25/2018).  Furthermore, she is anemic and has been requiring iron infusions.    Past Medical History:  Diagnosis Date  . Anemia   . Anxiety   . Cancer ([place]) 2019   BREAST- Left  . Depression   . Fibroid   . GERD (gastroesophageal reflux disease)     Past Surgical History:  Procedure Laterality Date  . BREAST BIOPSY Left 2019   [facility]  . BREAST LUMPECTOMY WITH AXILLARY LYMPH NODE BIOPSY Left 11/24/2017   Procedure: LEFT BREAST LUMPECTOMY WITH LEFT SENTINEL NODE LYMPH NODE BIOPSY;  Surgeon: Jovita Kussmaul, MD;  Location: [facility] ORS;  Service: General;  Laterality: Left;  . CESAREAN SECTION    . EYE SURGERY     DETACHED RETINA 4/519  . IR EMBO TUMOR ORGAN ISCHEMIA INFARCT INC GUIDE ROADMAPPING      Gynecologic History: Patient's last menstrual period was 02/12/2019.  Obstetric History: [id number]  Family History  Problem Relation Age of Onset  . Stroke Maternal Grandmother   . Breast cancer Neg Hx     Social History   Socioeconomic History  . Marital status: Divorced   Spouse name: Not on file  . Number of children: Not on file  . Years of education: Not on file  . Highest education level: Not on file  Occupational History  . Not on file  Social Needs  . Financial resource strain: Not on file  . Food insecurity    Worry: Not on file    Inability: Not on file  . Transportation needs    Medical: Not on file    Non-medical: Not on file  Tobacco Use  . Smoking status: Current Every Day Smoker  . Smokeless tobacco: Never Used  Substance and Sexual Activity  . Alcohol use: Yes    Comment: weekends  . Drug use: No  . Sexual activity: Yes    Birth control/protection: None  Lifestyle  . Physical activity    Days per week: 7 days    Minutes per session: 20 min  . Stress: Only a little  Relationships  . Social Herbalist on phone: Once a week    Gets together: Once a week    Attends religious service: Never    Active member of club or organization: No    Attends meetings of clubs or organizations: Never    Relationship status: Divorced  . Intimate partner violence    Fear of current or ex partner: No    Emotionally abused: No    Physically abused: No  Forced sexual activity: No  Other Topics Concern  . Not on file  Social History Narrative   ** Merged History Encounter **        Allergies  Allergen Reactions  . Demerol [Meperidine] Anaphylaxis    Pt does not tolerate pain killers.  [name] Percocet [Oxycodone-Acetaminophen] Anaphylaxis  . Sulfa Antibiotics Anaphylaxis    Prior to Admission medications   Medication Sig Start Date End Date Taking? Authorizing Provider  ALPRAZolam (XANAX) 0.25 MG tablet Take 1 tablet (0.25 mg total) by mouth 2 (two) times daily as needed for anxiety. 09/05/17  Yes Earlie Server, MD  cimetidine (TAGAMET HB) 200 MG tablet Take 200 mg by mouth daily as needed (acid reflux).   Yes [provider]  diphenhydramine-acetaminophen (TYLENOL PM) 25-500 MG TABS tablet Take 2 tablets by mouth at bedtime as  needed (sleep/pain).   Yes [provider]  ibuprofen (ADVIL,MOTRIN) 600 MG tablet Take 1 tablet by mouth three times daily with meals 01/20/18  Yes Hinda Kehr, MD  medroxyPROGESTERone (PROVERA) 10 MG tablet Take 2 tablets (20 mg total) by mouth daily. 06/11/18  Yes Will Bonnet, MD  Multiple Vitamin (MULTIVITAMIN WITH MINERALS) TABS tablet Take 1 tablet by mouth daily.   Yes [provider]  naproxen (NAPROSYN) 500 MG tablet Take 1 tablet (500 mg total) by mouth 2 (two) times daily with a meal. 01/07/19  Yes Triplett, Cari B, FNP  potassium chloride (K-DUR) 10 MEQ tablet Take 1 tablet (10 mEq total) by mouth daily. 04/28/18  Yes Earlie Server, MD  senna-docusate (SENOKOT-S) 8.6-50 MG tablet Take 2 tablets by mouth daily. 01/26/18  Yes Earlie Server, MD    Review of Systems  Constitutional: Positive for malaise/fatigue. Negative for chills, diaphoresis, fever and weight loss.  HENT: Negative.   Eyes: Negative.   Respiratory: Negative.   Cardiovascular: Negative.   Gastrointestinal: Negative.   Genitourinary: Negative.   Musculoskeletal: Negative.   Skin: Negative.   Neurological: Negative.   Psychiatric/Behavioral: Positive for depression. Negative for hallucinations, memory loss, substance abuse and suicidal ideas. The patient is nervous/anxious. The patient does not have insomnia.      Physical Exam BP (!) 147/91   Wt 155 lb (70.3 kg)   LMP 02/12/2019   BMI 25.02 kg/m  Patient's last menstrual period was 02/12/2019. Physical Exam Constitutional:      General: She is not in acute distress.    Appearance: Normal appearance.  HENT:     Head: Normocephalic and atraumatic.  Eyes:     General: No scleral icterus.    Conjunctiva/sclera: Conjunctivae normal.  Neurological:     General: No focal deficit present.     Mental Status: She is alert and oriented to person, place, and time.     Cranial Nerves: No cranial nerve deficit.  Psychiatric:        Mood and Affect:  Mood normal.        Behavior: Behavior normal.        Judgment: Judgment normal.    Assessment: 48 y.o. [id number] female here for  1. Intramural leiomyoma of uterus   2. Menorrhagia with irregular cycle   3. Iron deficiency anemia due to chronic blood loss      Plan: Problem List Items Addressed This Visit      Genitourinary   Fibroid uterus - Primary   Relevant Orders   US PELVIS TRANSVAGINAL NON-OB (TV ONLY)     Other   Iron deficiency anemia   Menorrhagia  with irregular cycle   Relevant Orders   US PELVIS TRANSVAGINAL NON-OB (TV ONLY)     Discussed management options for abnormal uterine bleeding including expectant, NSAIDs, tranexamic acid (Lysteda), oral progesterone (Provera, norethindrone, megace), Depo Provera, Levonorgestrel containing IUD, endometrial ablation (Novasure) or hysterectomy as definitive surgical management.  Discussed risks and benefits of each method.   Final management decision will hinge on results of patient's work up and whether an underlying etiology for the patients bleeding symptoms can be discerned.  We will obtain a pelvic ultrasound to see what is causing her pain on her right side and for potential preoperative planning.  We discussed an intrauterine device as well as hysterectomy for treatment options today.  We further discussed hormonal medication that is not an intrauterine device.  She will consider these and we will discuss further at her next appointment after her ultrasound.  20 minutes spent in face to face discussion with > 50% spent in counseling,management, and coordination of care of her menorrhagia with irregular cycle, fibroid uterus, and anemia due to chronic blood loss.    [name], MD 02/14/2019 5:58 PM

## 2019-02-18 ENCOUNTER — Ambulatory Visit (INDEPENDENT_AMBULATORY_CARE_PROVIDER_SITE_OTHER): Payer: BC Managed Care – PPO

## 2019-02-18 ENCOUNTER — Other Ambulatory Visit: Payer: Self-pay

## 2019-02-18 ENCOUNTER — Encounter: Payer: Medicaid Other | Admitting: Obstetrics and Gynecology

## 2019-02-18 DIAGNOSIS — D251 Intramural leiomyoma of uterus: Secondary | ICD-10-CM | POA: Diagnosis not present

## 2019-02-18 DIAGNOSIS — D252 Subserosal leiomyoma of uterus: Secondary | ICD-10-CM | POA: Diagnosis not present

## 2019-02-18 DIAGNOSIS — D250 Submucous leiomyoma of uterus: Secondary | ICD-10-CM | POA: Diagnosis not present

## 2019-02-18 DIAGNOSIS — N921 Excessive and frequent menstruation with irregular cycle: Secondary | ICD-10-CM

## 2019-02-18 NOTE — Progress Notes (Deleted)
Virtual Visit via Telephone Note  I connected with Cindy Brewer on 02/18/19 at  4:10 PM EDT by telephone and verified that I am speaking with the correct person using two identifiers.   I discussed the limitations, risks, security and privacy concerns of performing an evaluation and management service by telephone and the availability of in person appointments. I also discussed with the patient that there may be a patient responsible charge related to this service. The patient expressed understanding and agreed to proceed.  The patient was at *** I spoke with the patient from my  *** The names of people involved in this encounter were: *** , and *** , and *** .      Gynecology Ultrasound Follow Up  Chief Complaint:  Chief Complaint  Patient presents with  . Follow-up     History of Present Illness: Patient is a 48 y.o. female who presents today for ultrasound evaluation of *** .  Ultrasound demonstrates the following findings Adnexa: {adnexa findings ultrasound:314145} {Desc; adnexal mass:12229} Uterus: *** with endometrial stripe  {Numbers; 0-20:10930}.{Numbers; 0-20:10930} {Desc; mm/cm/in:30693} Additional: ***  Past Medical History:  Diagnosis Date  . Anemia   . Anxiety   . Cancer ([place]) 2019   BREAST- Left  . Depression   . Fibroid   . GERD (gastroesophageal reflux disease)     Past Surgical History:  Procedure Laterality Date  . BRE[facility] BIOPSY Left 2019   [facility]  . BREAST LUMPECTOMY WITH AXILLARY LYMPH NODE BIOPSY Left 11/24/2017   Procedure: LEFT BREAST LUMPECTOMY WITH LEFT SENTINEL NODE LYMPH NODE BIOPSY;  Surgeon: Jovita Kussmaul, MD;  Location: [facility] ORS;  Service: General;  Laterality: Left;  . CESAREAN SECTION    . EYE SURGERY     DETACHED RETINA 4/519  . IR EMBO TUMOR ORGAN ISCHEMIA INFARCT INC GUIDE ROADMAPPING      Gynecologic History:  Patient's last menstrual period was 02/12/2019. Contraception: {method:5051} Last Pap: ***. Results were:  .{Findings; lab pap smear results:16707::"no abnormalities"}  Family History  Problem Relation Age of Onset  . Stroke Maternal Grandmother   . Breast cancer Neg Hx     Social History   Socioeconomic History  . Marital status: Divorced    Spouse name: Not on file  . Number of children: Not on file  . Years of education: Not on file  . Highest education level: Not on file  Occupational History  . Not on file  Social Needs  . Financial resource strain: Not on file  . Food insecurity    Worry: Not on file    Inability: Not on file  . Transportation needs    Medical: Not on file    Non-medical: Not on file  Tobacco Use  . Smoking status: Current Every Day Smoker  . Smokeless tobacco: Never Used  Substance and Sexual Activity  . Alcohol use: Yes    Comment: weekends  . Drug use: No  . Sexual activity: Yes    Birth control/protection: None  Lifestyle  . Physical activity    Days per week: 7 days    Minutes per session: 20 min  . Stress: Only a little  Relationships  . Social Herbalist on phone: Once a week    Gets together: Once a week    Attends religious service: Never    Active member of club or organization: No    Attends meetings of clubs or organizations: Never    Relationship status: Divorced  .  Intimate partner violence    Fear of current or ex partner: No    Emotionally abused: No    Physically abused: No    Forced sexual activity: No  Other Topics Concern  . Not on file  Social History Narrative   ** Merged History Encounter **        Allergies  Allergen Reactions  . Demerol [Meperidine] Anaphylaxis    Pt does not tolerate pain killers.  [name] Percocet [Oxycodone-Acetaminophen] Anaphylaxis  . Sulfa Antibiotics Anaphylaxis    Prior to Admission medications   Medication Sig Start Date End Date Taking? Authorizing Provider  ALPRAZolam (XANAX) 0.25 MG tablet Take 1 tablet (0.25 mg total) by mouth 2 (two) times daily as needed for anxiety.  09/05/17   Earlie Server, MD  cimetidine (TAGAMET HB) 200 MG tablet Take 200 mg by mouth daily as needed (acid reflux).    [provider]  diphenhydramine-acetaminophen (TYLENOL PM) 25-500 MG TABS tablet Take 2 tablets by mouth at bedtime as needed (sleep/pain).    [provider]  ibuprofen (ADVIL,MOTRIN) 600 MG tablet Take 1 tablet by mouth three times daily with meals 01/20/18   Hinda Kehr, MD  medroxyPROGESTERone (PROVERA) 10 MG tablet Take 2 tablets (20 mg total) by mouth daily. 06/11/18   Will Bonnet, MD  Multiple Vitamin (MULTIVITAMIN WITH MINERALS) TABS tablet Take 1 tablet by mouth daily.    [provider]  naproxen (NAPROSYN) 500 MG tablet Take 1 tablet (500 mg total) by mouth 2 (two) times daily with a meal. 01/07/19   Triplett, Cari B, FNP  potassium chloride (K-DUR) 10 MEQ tablet Take 1 tablet (10 mEq total) by mouth daily. 04/28/18   Earlie Server, MD  senna-docusate (SENOKOT-S) 8.6-50 MG tablet Take 2 tablets by mouth daily. 01/26/18   Earlie Server, MD    Physical Exam LMP 02/12/2019    General: NAD HEENT: normocephalic, anicteric Pulmonary: No increased work of breathing Extremities: no edema, erythema, or tenderness Neurologic: Grossly intact, normal gait Psychiatric: mood appropriate, affect full   Assessment: 48 y.o. [id number] No diagnosis found.   Plan: Problem List Items Addressed This Visit    None      1)      Follow Up Instructions: ***   I discussed the assessment and treatment plan with the patient. The patient was provided an opportunity to ask questions and all were answered. The patient agreed with the plan and demonstrated an understanding of the instructions.   The patient was advised to call back or seek an in-person evaluation if the symptoms worsen or if the condition fails to improve as anticipated.  I provided *** minutes of non-face-to-face time during this encounter.  Adrian Prows MD [facility] 02/18/2019 5:01 PM

## 2019-02-18 NOTE — Progress Notes (Signed)
Unable to connect with patient for telemedicine review of her ultrasound and discussion.

## 2019-02-26 ENCOUNTER — Telehealth: Payer: Self-pay | Admitting: Obstetrics and Gynecology

## 2019-02-26 NOTE — Telephone Encounter (Signed)
Patient stopped by office today, she was following up on results from last visit she hadn't heard back about and also states she needs a note releasing her to return to work.  [phone] best cb.  Aware SDJ not in office today, returning tomorrow.

## 2019-02-27 NOTE — Telephone Encounter (Signed)
Called pts cell phone again and she answered. Pt is aware SDj will call after clinic hours. She also stated the paperwork that was given to julie is not correct and she will bring another letter that was emailed to her by her HR department sometime soon.

## 2019-02-27 NOTE — Telephone Encounter (Signed)
Please advise. Thank you

## 2019-02-27 NOTE — Telephone Encounter (Signed)
Discussed u/s and next steps.  She would like to proceed with myomectomy, hysteroscopically.  We discussed hysterectomy. But, doesn't mind a two-step process, if she needs a hysterectomy later on.

## 2019-02-27 NOTE — Telephone Encounter (Signed)
Can you call her and let her know that I'll call her after clinic, if that works for her.  I did try to call her the day she got the u/s, but I didn't document it. So, I can't prove it.

## 2019-02-27 NOTE — Telephone Encounter (Signed)
I tried to call the cell phone and it was off and the vm was full so I could not leave a message. I called the home phone number and asked if it was Cindy Brewer, the women on the phone stated that she wasn't and that I had the wrong number and hung up so I was unable to reach the pt with both numbers

## 2019-02-28 ENCOUNTER — Telehealth: Payer: Self-pay | Admitting: Obstetrics and Gynecology

## 2019-02-28 NOTE — Telephone Encounter (Signed)
Patient is aware of H&P at [facility] on 03/07/19 @ 2:30pm w/ Dr. Glennon Mac, Pre-admit testing to be scheduled, Covid testing on 03/08/19, and OR on 03/14/19. Patient is aware she will be asked to quarantine. Patient is aware she may receive calls from the [place] and [facility]. Patient confirmed BCBS and secondary Medicaid FPW. Patient is aware FPW will not pay.

## 2019-02-28 NOTE — Telephone Encounter (Signed)
-----   Message from Will Bonnet, MD sent at 02/27/2019  6:44 PM EDT ----- Regarding: Schedule Surgery Surgery Booking Request Patient Full Name:  Cindy Brewer  MRN: [id number]  DOB: [birth year]  Surgeon: [name], MD  Requested Surgery Date and Time: TBD Primary Diagnosis AND Code: Fibroid Uterus (D25.1), Menorrhagia with irregular cycle (N92.1), Iron deficiency anemia due to chronic blood loss (D50.0) Secondary Diagnosis and Code:  Surgical Procedure: hysteroscopy, myomectomy, Dilation and curettage L&D Notification: No Admission Status: same day surgery Length of Surgery: 60 min Special Case Needs: MyoSure (large MyoSure device) H&P: TBD (date) Phone Interview???: no Interpreter: Language:  Medical Clearance: no Special Scheduling Instructions: no Acuity: P3

## 2019-03-07 ENCOUNTER — Other Ambulatory Visit: Payer: Self-pay

## 2019-03-07 ENCOUNTER — Ambulatory Visit (INDEPENDENT_AMBULATORY_CARE_PROVIDER_SITE_OTHER): Payer: BC Managed Care – PPO | Admitting: Obstetrics and Gynecology

## 2019-03-07 ENCOUNTER — Encounter: Payer: Self-pay | Admitting: Obstetrics and Gynecology

## 2019-03-07 VITALS — BP 141/99 | HR 82 | Wt 155.0 lb

## 2019-03-07 DIAGNOSIS — N921 Excessive and frequent menstruation with irregular cycle: Secondary | ICD-10-CM

## 2019-03-07 DIAGNOSIS — D500 Iron deficiency anemia secondary to blood loss (chronic): Secondary | ICD-10-CM

## 2019-03-07 DIAGNOSIS — D251 Intramural leiomyoma of uterus: Secondary | ICD-10-CM

## 2019-03-07 DIAGNOSIS — D259 Leiomyoma of uterus, unspecified: Secondary | ICD-10-CM

## 2019-03-07 NOTE — H&P (View-Only) (Signed)
Preoperative History and Physical  Cindy Brewer is a 48 y.o. [id number] here for surgical management of Menorrhagia with irregular cycle, fibroid uterus, and iron deficiency anemia due to chronic blood loss.   No significant preoperative concerns.  History of Present Illness: 48 y.o. [id number] female who presents for pre-operative visit for heavy menstrual bleeding and pelvic pain. She states that she underwent uterine fibroid embolization late last year.  She had several months with minimal bleeding.  Then, in April or May she began to have heavy bleeding again with associated pain.  She has been passing clots.  She is unable to wear anything but dark-colored clothing.  Due to her bleeding, she has had to miss work and has been out of work for the past month.  She presents inquiring into her options of treatment at this point. (note: her Uterine Fibroid Embolization occurred at [facility].  See scanned documentation of this under the "Media" tab dated 07/25/2018. The date of her procedure was 07/25/2018).  Furthermore, she is anemic and has been requiring iron infusions.  Pelvic ultrasound recently showed a submucosal fibroid.  We discussed hysterectomy versus IUD versus myomectomy. The patient is trying avoid hysterectomy.  We discussed that myomectomy might be of limited benefit, but it would be impossible to predict.  She would like to proceed.   Proposed surgery: Hysteroscopy, dilation and curettage, myomectomy  Past Medical History:  Diagnosis Date  . Anemia   . Anxiety   . Cancer ([place]) 2019   BREAST- Left  . Depression   . Fibroid   . GERD (gastroesophageal reflux disease)    Past Surgical History:  Procedure Laterality Date  . BREAST BIOPSY Left 2019   [facility]  . BREAST LUMPECTOMY WITH AXILLARY LYMPH NODE BIOPSY Left 11/24/2017   Procedure: LEFT BREAST LUMPECTOMY WITH LEFT SENTINEL NODE LYMPH NODE BIOPSY;  Surgeon: Jovita Kussmaul, MD;  Location: [facility] ORS;  Service:  General;  Laterality: Left;  . CESAREAN SECTION    . EYE SURGERY     DETACHED RETINA 4/519  . IR EMBO TUMOR ORGAN ISCHEMIA INFARCT INC GUIDE ROADMAPPING     OB History  Gravida Para Term Preterm AB Living  6 0 0 0 1 5  SAB TAB Ectopic Multiple Live Births  1 0 0   5    # Outcome Date GA Lbr Len/2nd Weight Sex Delivery Anes PTL Lv  6 Gravida           5 Gravida           4 Gravida           3 Gravida           2 Gravida           1 SAB           Patient denies any other pertinent gynecologic issues.   Current Outpatient Medications on File Prior to Visit  Medication Sig Dispense Refill  . cimetidine (TAGAMET HB) 200 MG tablet Take 200 mg by mouth daily as needed (acid reflux).     Current Facility-Administered Medications on File Prior to Visit  Medication Dose Route Frequency Provider Last Rate Last Dose  . 0.9 %  sodium chloride infusion   Intravenous Continuous Earlie Server, MD 10 mL/hr at 01/11/19 1412     Allergies  Allergen Reactions  . Demerol [Meperidine] Anaphylaxis    Pt does not tolerate pain killers.  . Oxycodone Anaphylaxis  . Sulfa Antibiotics Anaphylaxis  Social History:   reports that she has been smoking. She has never used smokeless tobacco. She reports current alcohol use. She reports that she does not use drugs.  Family History  Problem Relation Age of Onset  . Stroke Maternal Grandmother   . Breast cancer Neg Hx     Review of Systems: Noncontributory  PHYSICAL EXAM: Blood pressure (!) 141/99, pulse 82, weight 155 lb (70.3 kg), last menstrual period 02/12/2019. CONSTITUTIONAL: Well-developed, well-nourished female in no acute distress.  HENT:  Normocephalic, atraumatic, External right and left ear normal. Oropharynx is clear and moist EYES: Conjunctivae and EOM are normal. Pupils are equal, round, and reactive to light. No scleral icterus.  NECK: Normal range of motion, supple, no masses SKIN: Skin is warm and dry. No rash noted. Not diaphoretic.  No erythema. No pallor. [place]: Alert and oriented to person, place, and time. Normal reflexes, muscle tone coordination. No cranial nerve deficit noted. PSYCHIATRIC: Normal mood and affect. Normal behavior. Normal judgment and thought content. CARDIOVASCULAR: Normal heart rate noted, regular rhythm RESPIRATORY: Effort and breath sounds normal, no problems with respiration noted ABDOMEN: Soft, nontender, nondistended. PELVIC: Deferred MUSCULOSKELETAL: Normal range of motion. No edema and no tenderness. 2+ distal pulses.  Labs: No results found for this or any previous visit (from the past 336 hour(s)).  Imaging Studies: US Pelvis Transvaginal Non-ob (tv Only)  Result Date: 02/18/2019 Patient Name: Cindy Brewer DOB: July 09, 1970 MRN: [id number] ULTRASOUND REPORT Location: [facility] Date of Service: 02/18/2019 Indications:Pelvic Pain Findings: The uterus is retroverted and measures 8.9 x 9.2 x 7.4 cm. Echo texture is heterogenous with evidence of focal masses. Within the uterus are multiple suspected fibroids measuring: Fibroid 1: 22.2 x 19.7 x 25.6 mm subserosal right Fibroid 2:26.0 x 29.9 x 27.9 mm subserosal right Fibroid 3: 50 x 50 x 54 mm intramural left Fibroid 4: 19.9 x 18.4 x 21.2 mm subserosal fundal Fibroid 5: 18.8 x 14.0 x 11.4 mm submucosal The Endometrium is not well visualized Right Ovary: measures 5.6 x 2.4 x 2.9 cm  It is normal in appearance. Left Ovary: measures 3.5 x 1.6 x 1.5 cm. It is normal in appearance. Survey of the adnexa demonstrates no adnexal masses. There is scant free fluid adjacent to the right ovary measures 25 x 18 x 13 mm Impression: 1. The endometrium is not clearly visualized. 2. There are multiple uterine fibroids. The fibroids have calcified rims and no blood flow within. There is one fibroid that may be subserosal it is calcified. 3. Normal ovaries. 4. Scant free fluid around the right ovary. [name], RT The ultrasound images and findings  were reviewed by me and I agree with the above report. [name], MD, [facility], [facility] 02/18/2019 5:02 PM      Assessment: Patient Active Problem List   Diagnosis Date Noted  . Fibroid uterus 03/08/2018  . Menorrhagia with irregular cycle 03/08/2018  . Iron deficiency anemia 02/28/2018    Plan: Patient will undergo surgical management with the above-noted surgery.   The risks of surgery were discussed in detail with the patient including but not limited to: bleeding which may require transfusion or reoperation; infection which may require antibiotics; injury to surrounding organs which may involve bowel, bladder, ureters ; need for additional procedures including laparoscopy or laparotomy; thromboembolic phenomenon, surgical site problems and other postoperative/anesthesia complications. Likelihood of success in alleviating the patient's condition was discussed. Routine postoperative instructions will be reviewed with the patient and  her family in detail after surgery.  The patient concurred with the proposed plan, giving informed written consent for the surgery.  Preoperative prophylactic antibiotics, as indicated, and SCDs ordered on call to the OR.    [name], MD 03/07/2019 2:41 PM

## 2019-03-07 NOTE — Progress Notes (Signed)
Preoperative History and Physical  Cindy Brewer is a 48 y.o. [id number] here for surgical management of Menorrhagia with irregular cycle, fibroid uterus, and iron deficiency anemia due to chronic blood loss.   No significant preoperative concerns.  History of Present Illness: 48 y.o. [id number] female who presents for pre-operative visit for heavy menstrual bleeding and pelvic pain. She states that she underwent uterine fibroid embolization late last year.  She had several months with minimal bleeding.  Then, in April or May she began to have heavy bleeding again with associated pain.  She has been passing clots.  She is unable to wear anything but dark-colored clothing.  Due to her bleeding, she has had to miss work and has been out of work for the past month.  She presents inquiring into her options of treatment at this point. (note: her Uterine Fibroid Embolization occurred at [facility].  See scanned documentation of this under the "Media" tab dated 07/25/2018. The date of her procedure was 07/25/2018).  Furthermore, she is anemic and has been requiring iron infusions.  Pelvic ultrasound recently showed a submucosal fibroid.  We discussed hysterectomy versus IUD versus myomectomy. The patient is trying avoid hysterectomy.  We discussed that myomectomy might be of limited benefit, but it would be impossible to predict.  She would like to proceed.   Proposed surgery: Hysteroscopy, dilation and curettage, myomectomy  Past Medical History:  Diagnosis Date  . Anemia   . Anxiety   . Cancer ([place]) 2019   BREAST- Left  . Depression   . Fibroid   . GERD (gastroesophageal reflux disease)    Past Surgical History:  Procedure Laterality Date  . BREAST BIOPSY Left 2019   [facility]  . BREAST LUMPECTOMY WITH AXILLARY LYMPH NODE BIOPSY Left 11/24/2017   Procedure: LEFT BREAST LUMPECTOMY WITH LEFT SENTINEL NODE LYMPH NODE BIOPSY;  Surgeon: Jovita Kussmaul, MD;  Location: [facility] ORS;  Service:  General;  Laterality: Left;  . CESAREAN SECTION    . EYE SURGERY     DETACHED RETINA 4/519  . IR EMBO TUMOR ORGAN ISCHEMIA INFARCT INC GUIDE ROADMAPPING     OB History  Gravida Para Term Preterm AB Living  6 0 0 0 1 5  SAB TAB Ectopic Multiple Live Births  1 0 0   5    # Outcome Date GA Lbr Len/2nd Weight Sex Delivery Anes PTL Lv  6 Gravida           5 Gravida           4 Gravida           3 Gravida           2 Gravida           1 SAB           Patient denies any other pertinent gynecologic issues.   Current Outpatient Medications on File Prior to Visit  Medication Sig Dispense Refill  . cimetidine (TAGAMET HB) 200 MG tablet Take 200 mg by mouth daily as needed (acid reflux).     Current Facility-Administered Medications on File Prior to Visit  Medication Dose Route Frequency Provider Last Rate Last Dose  . 0.9 %  sodium chloride infusion   Intravenous Continuous Earlie Server, MD 10 mL/hr at 01/11/19 1412     Allergies  Allergen Reactions  . Demerol [Meperidine] Anaphylaxis    Pt does not tolerate pain killers.  . Oxycodone Anaphylaxis  . Sulfa Antibiotics Anaphylaxis  Social History:   reports that she has been smoking. She has never used smokeless tobacco. She reports current alcohol use. She reports that she does not use drugs.  Family History  Problem Relation Age of Onset  . Stroke Maternal Grandmother   . Breast cancer Neg Hx     Review of Systems: Noncontributory  PHYSICAL EXAM: Blood pressure (!) 141/99, pulse 82, weight 155 lb (70.3 kg), last menstrual period 02/12/2019. CONSTITUTIONAL: Well-developed, well-nourished female in no acute distress.  HENT:  Normocephalic, atraumatic, External right and left ear normal. Oropharynx is clear and moist EYES: Conjunctivae and EOM are normal. Pupils are equal, round, and reactive to light. No scleral icterus.  NECK: Normal range of motion, supple, no masses SKIN: Skin is warm and dry. No rash noted. Not diaphoretic.  No erythema. No pallor. [place]: Alert and oriented to person, place, and time. Normal reflexes, muscle tone coordination. No cranial nerve deficit noted. PSYCHIATRIC: Normal mood and affect. Normal behavior. Normal judgment and thought content. CARDIOVASCULAR: Normal heart rate noted, regular rhythm RESPIRATORY: Effort and breath sounds normal, no problems with respiration noted ABDOMEN: Soft, nontender, nondistended. PELVIC: Deferred MUSCULOSKELETAL: Normal range of motion. No edema and no tenderness. 2+ distal pulses.  Labs: No results found for this or any previous visit (from the past 336 hour(s)).  Imaging Studies: US Pelvis Transvaginal Non-ob (tv Only)  Result Date: 02/18/2019 Patient Name: Cindy Brewer DOB: [birth year] MRN: [id number] ULTRASOUND REPORT Location: [facility] Date of Service: 02/18/2019 Indications:Pelvic Pain Findings: The uterus is retroverted and measures 8.9 x 9.2 x 7.4 cm. Echo texture is heterogenous with evidence of focal masses. Within the uterus are multiple suspected fibroids measuring: Fibroid 1: 22.2 x 19.7 x 25.6 mm subserosal right Fibroid 2:26.0 x 29.9 x 27.9 mm subserosal right Fibroid 3: 50 x 50 x 54 mm intramural left Fibroid 4: 19.9 x 18.4 x 21.2 mm subserosal fundal Fibroid 5: 18.8 x 14.0 x 11.4 mm submucosal The Endometrium is not well visualized Right Ovary: measures 5.6 x 2.4 x 2.9 cm  It is normal in appearance. Left Ovary: measures 3.5 x 1.6 x 1.5 cm. It is normal in appearance. Survey of the adnexa demonstrates no adnexal masses. There is scant free fluid adjacent to the right ovary measures 25 x 18 x 13 mm Impression: 1. The endometrium is not clearly visualized. 2. There are multiple uterine fibroids. The fibroids have calcified rims and no blood flow within. There is one fibroid that may be subserosal it is calcified. 3. Normal ovaries. 4. Scant free fluid around the right ovary. [name], RT The ultrasound images and findings  were reviewed by me and I agree with the above report. [name], MD, [facility], [facility] 02/18/2019 5:02 PM      Assessment: Patient Active Problem List   Diagnosis Date Noted  . Fibroid uterus 03/08/2018  . Menorrhagia with irregular cycle 03/08/2018  . Iron deficiency anemia 02/28/2018    Plan: Patient will undergo surgical management with the above-noted surgery.   The risks of surgery were discussed in detail with the patient including but not limited to: bleeding which may require transfusion or reoperation; infection which may require antibiotics; injury to surrounding organs which may involve bowel, bladder, ureters ; need for additional procedures including laparoscopy or laparotomy; thromboembolic phenomenon, surgical site problems and other postoperative/anesthesia complications. Likelihood of success in alleviating the patient's condition was discussed. Routine postoperative instructions will be reviewed with the patient and  her family in detail after surgery.  The patient concurred with the proposed plan, giving informed written consent for the surgery.  Preoperative prophylactic antibiotics, as indicated, and SCDs ordered on call to the OR.    [name], MD 03/07/2019 2:41 PM

## 2019-03-08 ENCOUNTER — Other Ambulatory Visit: Payer: Self-pay

## 2019-03-08 ENCOUNTER — Encounter
Admission: RE | Admit: 2019-03-08 | Discharge: 2019-03-08 | Disposition: A | Discharge status: Home / Self Care | Payer: BC Managed Care – PPO | Source: Ambulatory Visit | Attending: Obstetrics and Gynecology | Admitting: Obstetrics and Gynecology

## 2019-03-08 DIAGNOSIS — Z01812 Encounter for preprocedural laboratory examination: Secondary | ICD-10-CM | POA: Diagnosis present

## 2019-03-08 DIAGNOSIS — N921 Excessive and frequent menstruation with irregular cycle: Secondary | ICD-10-CM | POA: Diagnosis not present

## 2019-03-08 DIAGNOSIS — D500 Iron deficiency anemia secondary to blood loss (chronic): Secondary | ICD-10-CM | POA: Diagnosis not present

## 2019-03-08 DIAGNOSIS — D251 Intramural leiomyoma of uterus: Secondary | ICD-10-CM | POA: Insufficient documentation

## 2019-03-08 HISTORY — DX: Unspecified osteoarthritis, unspecified site: M19.90

## 2019-03-08 LAB — CBC
HCT: 32.4 % — ABNORMAL LOW (ref 36.0–46.0)
Hemoglobin: 9.8 g/dL — ABNORMAL LOW (ref 12.0–15.0)
MCH: 26.3 pg (ref 26.0–34.0)
MCHC: 30.2 g/dL (ref 30.0–36.0)
MCV: 86.9 fL (ref 80.0–100.0)
Platelets: 268 10*3/uL (ref 150–400)
RBC: 3.73 MIL/uL — ABNORMAL LOW (ref 3.87–5.11)
RDW: 18.7 % — ABNORMAL HIGH (ref 11.5–15.5)
WBC: 4.7 10*3/uL (ref 4.0–10.5)
nRBC: 0 % (ref 0.0–0.2)

## 2019-03-08 LAB — TYPE AND SCREEN
ABO/RH(D): O POS
Antibody Screen: NEGATIVE

## 2019-03-08 NOTE — Patient Instructions (Addendum)
Your procedure is scheduled on: 03-14-19 THURSDAY Report to Same Day Surgery 2nd floor medical mall [facility] Entrance-take elevator on left to 2nd floor.  Check in with surgery information desk.) To find out your arrival time please call [phone] between 1PM - 3PM on 03-13-19 [facility]  Remember: Instructions that are not followed completely may result in serious medical risk, up to and including death, or upon the discretion of your surgeon and anesthesiologist your surgery may need to be rescheduled.    _x___ 1. Do not eat food after midnight the night before your procedure. NO GUM OR CANDY AFTER MIDNIGHT.  You may drink clear liquids up to 2 hours before you are scheduled to arrive at the hospital for your procedure.  Do not drink clear liquids within 2 hours of your scheduled arrival to the hospital.  Clear liquids include  --Water or Apple juice without pulp  --Gatorade  --Black Coffee or Clear Tea (No milk, no creamers, do not add anything to the coffee or Tea   ____Ensure clear carbohydrate drink on the way to the hospital for bariatric patients  ____Ensure clear carbohydrate drink 3 hours before surgery.      __x__ 2. No Alcohol for 24 hours before or after surgery.   __x__3. No Smoking or e-cigarettes for 24 prior to surgery.  Do not use any chewable tobacco products for at least 6 hour prior to surgery   ____  4. Bring all medications with you on the day of surgery if instructed.    __x__ 5. Notify your doctor if there is any change in your medical condition     (cold, fever, infections).    x___6. On the morning of surgery brush your teeth with toothpaste and water.  You may rinse your mouth with mouth wash if you wish.  Do not swallow any toothpaste or mouthwash.   Do not wear jewelry, make-up, hairpins, clips or nail polish.  Do not wear lotions, powders, or perfumes. You may wear deodorant.  Do not shave 48 hours prior to surgery. Men may shave face and  neck.  Do not bring valuables to the hospital.    [facility] is not responsible for any belongings or valuables.               Contacts, dentures or bridgework may not be worn into surgery.  Leave your suitcase in the car. After surgery it may be brought to your room.  For patients admitted to the hospital, discharge time is determined by your treatment team.  _  Patients discharged the day of surgery will not be allowed to drive home.  You will need someone to drive you home and stay with you the night of your procedure.    Please read over the following fact sheets that you were given:   [facility] Preparing for Surgery and or MRSA Information   ____ Take anti-hypertensive listed below, cardiac, seizure, asthma, anti-reflux and psychiatric medicines. These include:  1. NONE  2.  3.  4.  5.  6.  ____Fleets enema or Magnesium Citrate as directed.   ____ Use CHG Soap or sage wipes as directed on instruction sheet   ____ Use inhalers on the day of surgery and bring to hospital day of surgery  ____ Stop Metformin and Janumet 2 days prior to surgery.    ____ Take 1/2 of usual insulin dose the night before surgery and none on the morning surgery.   ____ Follow recommendations  from Cardiologist, Pulmonologist or PCP regarding stopping Aspirin, Coumadin, Plavix ,Eliquis, Effient, or Pradaxa, and Pletal.  X____Stop Anti-inflammatories such as Advil, Aleve, Ibuprofen, Motrin, Naproxen, Naprosyn, Goodies powders or aspirin products NOW-OK to take Tylenol    ____ Stop supplements until after surgery.    ____ Bring C-Pap to the hospital.

## 2019-03-12 ENCOUNTER — Other Ambulatory Visit
Admission: RE | Admit: 2019-03-12 | Discharge: 2019-03-12 | Disposition: A | Discharge status: Home / Self Care | Payer: BC Managed Care – PPO | Source: Ambulatory Visit | Attending: Obstetrics and Gynecology | Admitting: Obstetrics and Gynecology

## 2019-03-12 ENCOUNTER — Other Ambulatory Visit: Payer: Self-pay

## 2019-03-12 DIAGNOSIS — Z20828 Contact with and (suspected) exposure to other viral communicable diseases: Secondary | ICD-10-CM | POA: Diagnosis present

## 2019-03-12 DIAGNOSIS — Z01812 Encounter for preprocedural laboratory examination: Secondary | ICD-10-CM | POA: Insufficient documentation

## 2019-03-12 LAB — SARS CORONAVIRUS 2 (TAT 6-24 HRS): SARS Coronavirus 2: NEGATIVE

## 2019-03-14 ENCOUNTER — Ambulatory Visit: Payer: BC Managed Care – PPO | Admitting: Anesthesiology

## 2019-03-14 ENCOUNTER — Other Ambulatory Visit: Payer: Self-pay

## 2019-03-14 ENCOUNTER — Encounter
Admission: RE | Disposition: A | Discharge status: Home / Self Care | Payer: Self-pay | Source: Home / Self Care | Attending: Obstetrics and Gynecology

## 2019-03-14 ENCOUNTER — Ambulatory Visit
Admission: RE | Admit: 2019-03-14 | Discharge: 2019-03-14 | Disposition: A | Discharge status: Home / Self Care | Payer: BC Managed Care – PPO | Attending: Obstetrics and Gynecology | Admitting: Obstetrics and Gynecology

## 2019-03-14 DIAGNOSIS — Z79899 Other long term (current) drug therapy: Secondary | ICD-10-CM | POA: Insufficient documentation

## 2019-03-14 DIAGNOSIS — D251 Intramural leiomyoma of uterus: Secondary | ICD-10-CM

## 2019-03-14 DIAGNOSIS — N921 Excessive and frequent menstruation with irregular cycle: Secondary | ICD-10-CM | POA: Diagnosis not present

## 2019-03-14 DIAGNOSIS — K219 Gastro-esophageal reflux disease without esophagitis: Secondary | ICD-10-CM | POA: Diagnosis not present

## 2019-03-14 DIAGNOSIS — D509 Iron deficiency anemia, unspecified: Secondary | ICD-10-CM | POA: Diagnosis present

## 2019-03-14 DIAGNOSIS — F17200 Nicotine dependence, unspecified, uncomplicated: Secondary | ICD-10-CM | POA: Insufficient documentation

## 2019-03-14 DIAGNOSIS — Z853 Personal history of malignant neoplasm of breast: Secondary | ICD-10-CM | POA: Diagnosis not present

## 2019-03-14 DIAGNOSIS — N920 Excessive and frequent menstruation with regular cycle: Secondary | ICD-10-CM | POA: Diagnosis not present

## 2019-03-14 DIAGNOSIS — F419 Anxiety disorder, unspecified: Secondary | ICD-10-CM | POA: Insufficient documentation

## 2019-03-14 DIAGNOSIS — D500 Iron deficiency anemia secondary to blood loss (chronic): Secondary | ICD-10-CM | POA: Diagnosis not present

## 2019-03-14 DIAGNOSIS — D250 Submucous leiomyoma of uterus: Secondary | ICD-10-CM

## 2019-03-14 DIAGNOSIS — D259 Leiomyoma of uterus, unspecified: Secondary | ICD-10-CM | POA: Diagnosis not present

## 2019-03-14 DIAGNOSIS — D252 Subserosal leiomyoma of uterus: Secondary | ICD-10-CM

## 2019-03-14 HISTORY — PX: HYSTEROSCOPY WITH D & C: SHX1775

## 2019-03-14 LAB — POCT PREGNANCY, URINE: Preg Test, Ur: NEGATIVE

## 2019-03-14 LAB — ABO/RH: ABO/RH(D): O POS

## 2019-03-14 SURGERY — DILATATION AND CURETTAGE /HYSTEROSCOPY
Anesthesia: General

## 2019-03-14 MED ORDER — FENTANYL CITRATE (PF) 100 MCG/2ML IJ SOLN
INTRAMUSCULAR | Status: AC
  Filled 2019-03-14: qty 2

## 2019-03-14 MED ORDER — ONDANSETRON HCL 4 MG/2ML IJ SOLN
INTRAMUSCULAR | Status: DC | PRN
  Administered 2019-03-14: 4 mg via INTRAVENOUS

## 2019-03-14 MED ORDER — LABETALOL HCL 5 MG/ML IV SOLN
INTRAVENOUS | Status: DC | PRN
  Administered 2019-03-14: 5 mg via INTRAVENOUS

## 2019-03-14 MED ORDER — PROPOFOL 10 MG/ML IV BOLUS
INTRAVENOUS | Status: DC | PRN
  Administered 2019-03-14: 140 mg via INTRAVENOUS
  Administered 2019-03-14: 60 mg via INTRAVENOUS

## 2019-03-14 MED ORDER — SUGAMMADEX SODIUM 200 MG/2ML IV SOLN
INTRAVENOUS | Status: DC | PRN
  Administered 2019-03-14: 150 mg via INTRAVENOUS

## 2019-03-14 MED ORDER — SUCCINYLCHOLINE CHLORIDE 20 MG/ML IJ SOLN
INTRAMUSCULAR | Status: AC
  Filled 2019-03-14: qty 1

## 2019-03-14 MED ORDER — MIDAZOLAM HCL 2 MG/2ML IJ SOLN
INTRAMUSCULAR | Status: AC
  Filled 2019-03-14: qty 2

## 2019-03-14 MED ORDER — FAMOTIDINE 20 MG PO TABS
ORAL_TABLET | ORAL | Status: AC
  Administered 2019-03-14: 10:00:00 20 mg via ORAL
  Filled 2019-03-14: qty 1

## 2019-03-14 MED ORDER — DEXAMETHASONE SODIUM PHOSPHATE 10 MG/ML IJ SOLN
INTRAMUSCULAR | Status: AC
  Filled 2019-03-14: qty 1

## 2019-03-14 MED ORDER — LACTATED RINGERS IV SOLN: INTRAVENOUS | Status: DC

## 2019-03-14 MED ORDER — SUGAMMADEX SODIUM 200 MG/2ML IV SOLN
INTRAVENOUS | Status: AC
  Filled 2019-03-14: qty 2

## 2019-03-14 MED ORDER — FENTANYL CITRATE (PF) 100 MCG/2ML IJ SOLN: 25.0000 ug | INTRAMUSCULAR | Status: DC | PRN

## 2019-03-14 MED ORDER — FENTANYL CITRATE (PF) 100 MCG/2ML IJ SOLN
INTRAMUSCULAR | Status: DC | PRN
  Administered 2019-03-14: 25 ug via INTRAVENOUS
  Administered 2019-03-14: 50 ug via INTRAVENOUS
  Administered 2019-03-14: 25 ug via INTRAVENOUS

## 2019-03-14 MED ORDER — IBUPROFEN 600 MG PO TABS: 600.0000 mg | ORAL_TABLET | Freq: Four times a day (QID) | ORAL | 0 refills | Status: DC | PRN

## 2019-03-14 MED ORDER — ROCURONIUM BROMIDE 50 MG/5ML IV SOLN
INTRAVENOUS | Status: AC
  Filled 2019-03-14: qty 1

## 2019-03-14 MED ORDER — ROCURONIUM BROMIDE 100 MG/10ML IV SOLN
INTRAVENOUS | Status: DC | PRN
  Administered 2019-03-14: 10 mg via INTRAVENOUS
  Administered 2019-03-14: 20 mg via INTRAVENOUS

## 2019-03-14 MED ORDER — ONDANSETRON HCL 4 MG/2ML IJ SOLN: 4.0000 mg | Freq: Once | INTRAMUSCULAR | Status: DC | PRN

## 2019-03-14 MED ORDER — ONDANSETRON HCL 4 MG/2ML IJ SOLN
INTRAMUSCULAR | Status: AC
  Filled 2019-03-14: qty 2

## 2019-03-14 MED ORDER — IBUPROFEN 600 MG PO TABS
ORAL_TABLET | ORAL | Status: AC
  Administered 2019-03-14: 13:00:00 600 mg via ORAL
  Filled 2019-03-14: qty 1

## 2019-03-14 MED ORDER — LIDOCAINE HCL (PF) 2 % IJ SOLN
INTRAMUSCULAR | Status: AC
  Filled 2019-03-14: qty 10

## 2019-03-14 MED ORDER — MIDAZOLAM HCL 2 MG/2ML IJ SOLN
INTRAMUSCULAR | Status: DC | PRN
  Administered 2019-03-14: 2 mg via INTRAVENOUS

## 2019-03-14 MED ORDER — LACTATED RINGERS IV SOLN
INTRAVENOUS | Status: DC
  Administered 2019-03-14: 10:00:00 via INTRAVENOUS

## 2019-03-14 MED ORDER — SUCCINYLCHOLINE CHLORIDE 20 MG/ML IJ SOLN
INTRAMUSCULAR | Status: DC | PRN
  Administered 2019-03-14: 100 mg via INTRAVENOUS

## 2019-03-14 MED ORDER — FAMOTIDINE 20 MG PO TABS
20.0000 mg | ORAL_TABLET | Freq: Once | ORAL | Status: AC
  Administered 2019-03-14: 10:00:00 20 mg via ORAL

## 2019-03-14 MED ORDER — SILVER NITRATE-POT NITRATE 75-25 % EX MISC
CUTANEOUS | Status: DC | PRN
  Administered 2019-03-14: 2

## 2019-03-14 MED ORDER — DEXAMETHASONE SODIUM PHOSPHATE 10 MG/ML IJ SOLN
INTRAMUSCULAR | Status: DC | PRN
  Administered 2019-03-14: 5 mg via INTRAVENOUS

## 2019-03-14 MED ORDER — IBUPROFEN 600 MG PO TABS
600.0000 mg | ORAL_TABLET | Freq: Once | ORAL | Status: AC
  Administered 2019-03-14: 13:00:00 600 mg via ORAL

## 2019-03-14 MED ORDER — LABETALOL HCL 5 MG/ML IV SOLN
INTRAVENOUS | Status: AC
  Filled 2019-03-14: qty 4

## 2019-03-14 MED ORDER — LIDOCAINE HCL (CARDIAC) PF 100 MG/5ML IV SOSY
PREFILLED_SYRINGE | INTRAVENOUS | Status: DC | PRN
  Administered 2019-03-14: 100 mg via INTRAVENOUS

## 2019-03-14 SURGICAL SUPPLY — 29 items
BAG URINE DRAINAGE (UROLOGICAL SUPPLIES) IMPLANT
CANISTER SUCT 3000ML PPV (MISCELLANEOUS) IMPLANT
CATH FOLEY 2WAY  5CC 16FR (CATHETERS)
CATH ROBINSON RED A/P 16FR (CATHETERS) ×3 IMPLANT
CATH URTH 16FR FL 2W BLN LF (CATHETERS) IMPLANT
COVER WAND RF STERILE (DRAPES) IMPLANT
DEVICE MYOSURE LITE (MISCELLANEOUS) IMPLANT
DEVICE MYOSURE REACH (MISCELLANEOUS) ×3 IMPLANT
ELECT REM PT RETURN 9FT ADLT (ELECTROSURGICAL)
ELECTRODE REM PT RTRN 9FT ADLT (ELECTROSURGICAL) IMPLANT
GLOVE BIO SURGEON STRL SZ7 (GLOVE) ×9 IMPLANT
GLOVE BIOGEL PI IND STRL 7.5 (GLOVE) ×2 IMPLANT
GLOVE BIOGEL PI INDICATOR 7.5 (GLOVE) ×4
GOWN STRL REUS W/ TWL LRG LVL3 (GOWN DISPOSABLE) ×3 IMPLANT
GOWN STRL REUS W/TWL LRG LVL3 (GOWN DISPOSABLE) ×6
IV LACTATED RINGERS 1000ML (IV SOLUTION) IMPLANT
IV NS 1000ML (IV SOLUTION) ×2
IV NS 1000ML BAXH (IV SOLUTION) ×1 IMPLANT
IV NS IRRIG 3000ML ARTHROMATIC (IV SOLUTION) ×3 IMPLANT
KIT PROCEDURE FLUENT (KITS) ×3 IMPLANT
KIT TURNOVER CYSTO (KITS) ×3 IMPLANT
MYOSURE XL FIBROID (MISCELLANEOUS)
PACK DNC HYST (MISCELLANEOUS) ×3 IMPLANT
PAD OB MATERNITY 4.3X12.25 (PERSONAL CARE ITEMS) ×3 IMPLANT
PAD PREP 24X41 OB/GYN DISP (PERSONAL CARE ITEMS) ×3 IMPLANT
SEAL ROD LENS SCOPE MYOSURE (ABLATOR) ×3 IMPLANT
SYSTEM TISS REMOVAL MYOSURE XL (MISCELLANEOUS) IMPLANT
TUBING CONNECTING 10 (TUBING) ×2 IMPLANT
TUBING CONNECTING 10' (TUBING) ×1

## 2019-03-14 NOTE — Transfer of Care (Signed)
Immediate Anesthesia Transfer of Care Note  Patient: Cindy Brewer  Procedure(s) Performed: DILATATION AND CURETTAGE /HYSTEROSCOPY, MYOMECTOMY (N/A )  Patient Location: PACU  Anesthesia Type:General  Level of Consciousness: awake  Airway & Oxygen Therapy: Patient connected to face mask oxygen  Post-op Assessment: Post -op Vital signs reviewed and stable  Post vital signs: stable  Last Vitals:  Vitals Value Taken Time  BP 137/84 03/14/19 1149  Temp 36.6 C 03/14/19 1149  Pulse 75 03/14/19 1150  Resp 19 03/14/19 1150  SpO2 100 % 03/14/19 1150  Vitals shown include unvalidated device data.  Last Pain:  Vitals:   03/14/19 0933  TempSrc: Tympanic  PainSc: 0-No pain         Complications: No apparent anesthesia complications

## 2019-03-14 NOTE — Anesthesia Post-op Follow-up Note (Signed)
Anesthesia QCDR form completed.        

## 2019-03-14 NOTE — Progress Notes (Signed)
Spoke with Pts. Daughter regarding pts transportation home . Daughter has arranged for [facility] transport to transport Pt. To her home in [place].

## 2019-03-14 NOTE — Discharge Instructions (Signed)

## 2019-03-14 NOTE — Anesthesia Preprocedure Evaluation (Addendum)
Anesthesia Evaluation  Patient identified by MRN, date of birth, ID band Patient awake    Reviewed: Allergy & Precautions, NPO status , Patient's Chart, lab work & pertinent test results  Airway Mallampati: II  TM Distance: >3 FB     Dental  (+) Teeth Intact   Pulmonary Current Smoker,    Pulmonary exam normal        Cardiovascular negative cardio ROS Normal cardiovascular exam     Neuro/Psych PSYCHIATRIC DISORDERS Anxiety Depression negative neurological ROS     GI/Hepatic Neg liver ROS, GERD  Poorly Controlled,  Endo/Other  negative endocrine ROS  Renal/GU negative Renal ROS  negative genitourinary   Musculoskeletal negative musculoskeletal ROS (+) Arthritis , Osteoarthritis,    Abdominal Normal abdominal exam  (+)   Peds negative pediatric ROS (+)  Hematology  (+) anemia ,   Anesthesia Other Findings Past Medical History: No date: Anemia No date: Anxiety No date: Cancer ([place])     Comment:  BREAST No date: Depression No date: Fibroid No date: GERD (gastroesophageal reflux disease)  Reproductive/Obstetrics                             Anesthesia Physical  Anesthesia Plan  ASA: II  Anesthesia Plan: General   Post-op Pain Management:    Induction: Intravenous, Rapid sequence and Cricoid pressure planned  PONV Risk Score and Plan:   Airway Management Planned: Oral ETT  Additional Equipment:   Intra-op Plan:   Post-operative Plan: Extubation in OR  Informed Consent: I have reviewed the patients History and Physical, chart, labs and discussed the procedure including the risks, benefits and alternatives for the proposed anesthesia with the patient or authorized representative who has indicated his/her understanding and acceptance.     Dental advisory given  Plan Discussed with: CRNA and Anesthesiologist  Anesthesia Plan Comments: (Patient has used fentanyl with the  past breast surgery without problem.)       Anesthesia Quick Evaluation

## 2019-03-14 NOTE — Op Note (Signed)
  Operative Note    Pre-Operative Diagnosis:  1) Menorrhagia with irregular cycle 2) Fibroid uterus 3) iron deficiency anemia due to chronic blood loss  Post-Operative Diagnosis:  1) Menorrhagia with irregular cycle 2) Fibroid uterus 3) iron deficiency anemia due to chronic blood loss  Procedures:  1. Hysteroscopy, dilation and curettage 2. Submucosal myomectomy using MyoSure  Primary Surgeon: [name], MD   EBL: 100 mL   IVF: 600 mL   Urine output: 50 mL  Fluid Deficit: 790 mL  Specimens: Endometrial curetting and submucosal fibroid  Drains: none  Complications: None   Disposition: PACU   Condition: Stable   Findings:  1) Overall grossly normal-appearing uterine cavity  2) Small yellow dense blebs of tissue located near the left tubal ostium. 3) Approximately 2-3 cm posterior right submucosal fibroid  Procedure Summary:   After informed consent was obtained, the patient was taken to the operating room where anesthesia was obtained without difficulty. The patient was positioned in the dorsal lithotomy position in [place].  The patient's bladder was catheterized with an in-and-out foley catheter.  The patient was examined under anesthesia, with the above noted findings.  The bi-valved speculum was placed inside the patient's vagina, and the the anterior lip of the cervix was seen and grasped with the tenaculum.  The cervix was progressively dilated to a 7 mm Hegar dilator.  The hysteroscope was introduced, with the above noted findings.  The MyoSure device was introduced and the fibroid and yellow tissue lesions near the left tubal ostium were remove without difficulty.  The hystersocope was removed and the uterine cavity was curetted until a gritty texture was noted, yielding small endometrial curettings.  The hysteroscope was re-introduced and hemostasis was noted, even after dropping pressure to 40 mmHg (MAP 82 mmHg).  All instruments were removed, with  excellent hemostasis noted at the tenaculum entry site after application of silver nitrate.    The patient tolerated the procedure well.  Sponge, lap, needle, and instrument counts were correct x 2.  VTE prophylaxis: SCDs. Antibiotic prophylaxis: none indicated and none given. She was awakened in the operating room and was taken to the PACU in stable condition.   [name], MD 03/14/2019 11:46 AM

## 2019-03-14 NOTE — Anesthesia Procedure Notes (Signed)
Procedure Name: Intubation Date/Time: 03/14/2019 10:36 AM Performed by: Aline Brochure, CRNA Pre-anesthesia Checklist: Patient identified, Emergency Drugs available, Suction available and Patient being monitored Patient Re-evaluated:Patient Re-evaluated prior to induction Oxygen Delivery Method: Circle system utilized Preoxygenation: Pre-oxygenation with 100% oxygen Induction Type: IV induction Ventilation: Mask ventilation without difficulty Laryngoscope Size: Mac and 3 Grade View: Grade II Tube type: Oral Tube size: 7.0 mm Number of attempts: 1 Airway Equipment and Method: Stylet Placement Confirmation: ETT inserted through vocal cords under direct vision,  positive ETCO2 and breath sounds checked- equal and bilateral Secured at: 21 cm Tube secured with: Tape Dental Injury: Teeth and Oropharynx as per pre-operative assessment

## 2019-03-14 NOTE — Interval H&P Note (Signed)
History and Physical Interval Note:  03/14/2019 9:57 AM  Cindy Brewer  has presented today for surgery, with the diagnosis of D25.1 FIBROID UTERUS N92.1 MENORRHAGIA WITH IRREGULAR CYCLE D50.0 IRON DEFICIENCY ANEMIA DUE TO CHRONIC BLOOD LOSS.  The various methods of treatment have been discussed with the patient and family. After consideration of risks, benefits and other options for treatment, the patient has consented to  Procedure(s): DILATATION AND CURETTAGE /HYSTEROSCOPY, MYOMECTOMY (N/A) as a surgical intervention.  The patient's history has been reviewed, patient examined, no change in status, stable for surgery.  I have reviewed the patient's chart and labs.  Questions were answered to the patient's satisfaction.    [name], MD, [facility], [facility] 03/14/2019 9:57 AM

## 2019-03-15 ENCOUNTER — Encounter: Payer: Self-pay | Admitting: Obstetrics and Gynecology

## 2019-03-15 NOTE — Anesthesia Postprocedure Evaluation (Signed)
Anesthesia Post Note  Patient: Cindy Brewer  Procedure(s) Performed: DILATATION AND CURETTAGE /HYSTEROSCOPY, MYOMECTOMY (N/A )  Patient location during evaluation: PACU Anesthesia Type: General Level of consciousness: awake and alert and oriented Pain management: pain level controlled Vital Signs Assessment: post-procedure vital signs reviewed and stable Respiratory status: spontaneous breathing Cardiovascular status: blood pressure returned to baseline Anesthetic complications: no     Last Vitals:  Vitals:   03/14/19 1238 03/14/19 1345  BP: (!) 143/72 (!) 146/85  Pulse: 67 70  Resp: 16 16  Temp: (!) 36.4 C   SpO2: 100% 100%    Last Pain:  Vitals:   03/14/19 1345  TempSrc:   PainSc: 0-No pain                 Rukaya Kleinschmidt

## 2019-03-18 LAB — SURGICAL PATHOLOGY

## 2019-03-20 ENCOUNTER — Encounter: Payer: Self-pay | Admitting: Obstetrics and Gynecology

## 2019-03-27 ENCOUNTER — Ambulatory Visit (INDEPENDENT_AMBULATORY_CARE_PROVIDER_SITE_OTHER): Payer: BC Managed Care – PPO | Admitting: Obstetrics and Gynecology

## 2019-03-27 ENCOUNTER — Other Ambulatory Visit: Payer: Self-pay

## 2019-03-27 ENCOUNTER — Other Ambulatory Visit (HOSPITAL_COMMUNITY)
Admission: RE | Admit: 2019-03-27 | Discharge: 2019-03-27 | Disposition: A | Discharge status: Home / Self Care | Payer: BC Managed Care – PPO | Source: Ambulatory Visit | Attending: Obstetrics and Gynecology | Admitting: Obstetrics and Gynecology

## 2019-03-27 ENCOUNTER — Encounter: Payer: Self-pay | Admitting: Obstetrics and Gynecology

## 2019-03-27 VITALS — BP 150/96 | HR 80 | Wt 156.0 lb

## 2019-03-27 DIAGNOSIS — B9689 Other specified bacterial agents as the cause of diseases classified elsewhere: Secondary | ICD-10-CM

## 2019-03-27 DIAGNOSIS — N760 Acute vaginitis: Secondary | ICD-10-CM

## 2019-03-27 DIAGNOSIS — Z113 Encounter for screening for infections with a predominantly sexual mode of transmission: Secondary | ICD-10-CM | POA: Diagnosis not present

## 2019-03-27 DIAGNOSIS — Z09 Encounter for follow-up examination after completed treatment for conditions other than malignant neoplasm: Secondary | ICD-10-CM

## 2019-03-27 MED ORDER — METRONIDAZOLE 500 MG PO TABS: 500.0000 mg | ORAL_TABLET | Freq: Two times a day (BID) | ORAL | 0 refills | Status: AC

## 2019-03-27 NOTE — Progress Notes (Signed)
   Postoperative Follow-up Patient presents post op from hysteroscopy, dilation and curettage, polypectomy, myomectomy 2 weeks ago for menorrhagia with irregular cycle, fibroid uterus, iron deficiency anemia due to chronic blood loss.  Subjective: Patient reports some improvement in her preop symptoms. Eating a regular diet without difficulty. The patient is not having any pain.  Activity: normal activities of daily living. She notes an irregular smell to her discharge. She has irritative symptoms, as well.   Objective: Vitals:   03/27/19 1435  BP: (!) 150/96  Pulse: 80   Vital Signs: BP (!) 150/96   Pulse 80   Wt 156 lb (70.8 kg)   BMI 25.18 kg/m  Constitutional: Well nourished, well developed female in no acute distress.  HEENT: normal Skin: Warm and dry.  Extremity: no edema  Abdomen: Soft, non-tender, normal bowel sounds; no bruits, organomegaly or masses.   Pelvic exam: (female chaperone present) is not limited by body habitus EGBUS: within normal limits Vagina: within normal limits and with no blood in the vault Cervix: closed, without lesions, non-tender    Assessment: 48 y.o. s/p hysteroscopy, D&C, polypectomy, myomectomy progressing well Bacterial vaginosis: flagyl rx  Plan: Patient has done well after surgery with no apparent complications.  I have discussed the post-operative course to date, and the expected progress moving forward.  The patient understands what complications to be concerned about.  I will see the patient in routine follow up, or sooner if needed.    Activity plan: No restriction.  STD screen, per patient request  [name] 03/27/2019, 3:00 PM

## 2019-03-29 ENCOUNTER — Encounter: Payer: Self-pay | Admitting: Oncology

## 2019-03-29 ENCOUNTER — Telehealth: Payer: BC Managed Care – PPO | Admitting: Family

## 2019-03-29 ENCOUNTER — Other Ambulatory Visit: Payer: Self-pay

## 2019-03-29 ENCOUNTER — Inpatient Hospital Stay: Payer: BC Managed Care – PPO | Attending: Oncology

## 2019-03-29 DIAGNOSIS — D500 Iron deficiency anemia secondary to blood loss (chronic): Secondary | ICD-10-CM | POA: Insufficient documentation

## 2019-03-29 DIAGNOSIS — E041 Nontoxic single thyroid nodule: Secondary | ICD-10-CM | POA: Insufficient documentation

## 2019-03-29 DIAGNOSIS — F419 Anxiety disorder, unspecified: Secondary | ICD-10-CM | POA: Diagnosis not present

## 2019-03-29 DIAGNOSIS — D259 Leiomyoma of uterus, unspecified: Secondary | ICD-10-CM | POA: Diagnosis not present

## 2019-03-29 DIAGNOSIS — R109 Unspecified abdominal pain: Secondary | ICD-10-CM

## 2019-03-29 DIAGNOSIS — F17210 Nicotine dependence, cigarettes, uncomplicated: Secondary | ICD-10-CM | POA: Diagnosis not present

## 2019-03-29 DIAGNOSIS — Z171 Estrogen receptor negative status [ER-]: Secondary | ICD-10-CM | POA: Diagnosis not present

## 2019-03-29 DIAGNOSIS — R030 Elevated blood-pressure reading, without diagnosis of hypertension: Secondary | ICD-10-CM | POA: Diagnosis not present

## 2019-03-29 DIAGNOSIS — N920 Excessive and frequent menstruation with regular cycle: Secondary | ICD-10-CM | POA: Diagnosis not present

## 2019-03-29 DIAGNOSIS — R399 Unspecified symptoms and signs involving the genitourinary system: Secondary | ICD-10-CM

## 2019-03-29 DIAGNOSIS — C50812 Malignant neoplasm of overlapping sites of left female breast: Secondary | ICD-10-CM | POA: Insufficient documentation

## 2019-03-29 LAB — FERRITIN: Ferritin: 10 ng/mL — ABNORMAL LOW (ref 11–307)

## 2019-03-29 LAB — CBC WITH DIFFERENTIAL/PLATELET
Abs Immature Granulocytes: 0.01 10*3/uL (ref 0.00–0.07)
Basophils Absolute: 0 10*3/uL (ref 0.0–0.1)
Basophils Relative: 1 %
Eosinophils Absolute: 0.1 10*3/uL (ref 0.0–0.5)
Eosinophils Relative: 2 %
HCT: 30.4 % — ABNORMAL LOW (ref 36.0–46.0)
Hemoglobin: 8.9 g/dL — ABNORMAL LOW (ref 12.0–15.0)
Immature Granulocytes: 0 %
Lymphocytes Relative: 22 %
Lymphs Abs: 0.8 10*3/uL (ref 0.7–4.0)
MCH: 25.4 pg — ABNORMAL LOW (ref 26.0–34.0)
MCHC: 29.3 g/dL — ABNORMAL LOW (ref 30.0–36.0)
MCV: 86.6 fL (ref 80.0–100.0)
Monocytes Absolute: 0.4 10*3/uL (ref 0.1–1.0)
Monocytes Relative: 11 %
Neutro Abs: 2.4 10*3/uL (ref 1.7–7.7)
Neutrophils Relative %: 64 %
Platelets: 252 10*3/uL (ref 150–400)
RBC: 3.51 MIL/uL — ABNORMAL LOW (ref 3.87–5.11)
RDW: 18.1 % — ABNORMAL HIGH (ref 11.5–15.5)
WBC: 3.8 10*3/uL — ABNORMAL LOW (ref 4.0–10.5)
nRBC: 0 % (ref 0.0–0.2)

## 2019-03-29 LAB — IRON AND TIBC
Iron: 31 ug/dL (ref 28–170)
Saturation Ratios: 8 % — ABNORMAL LOW (ref 10.4–31.8)
TIBC: 394 ug/dL (ref 250–450)
UIBC: 363 ug/dL

## 2019-03-29 LAB — CERVICOVAGINAL ANCILLARY ONLY
Chlamydia: NEGATIVE
Neisseria Gonorrhea: NEGATIVE
Trichomonas: NEGATIVE

## 2019-03-29 NOTE — Progress Notes (Signed)
Based on what you shared with me, I feel your condition warrants further evaluation and I recommend that you be seen for a face to face office visit.  Given your symptoms of a UTI with back pain you need to be seen face-to-face to rule out a more serious infection.  NOTE: If you entered your credit card information for this eVisit, you will not be charged. You may see a "hold" on your card for the $35 but that hold will drop off and you will not have a charge processed.  If you are having a true medical emergency please call 911.     For an urgent face to face visit, [place] has four urgent care centers for your convenience:   . [facility]    [phone]                  Get Driving Directions  [id number] [place], [place] [address] . 10 am to 8 pm Monday-Friday . 12 pm to 8 pm Saturday-Sunday   . [facility] [place]                  Get Driving Directions  [id number] [place], [place], [place] [address] . 8 am to 8 pm Monday-Friday . 9 am to 6 pm Saturday . 11 am to 6 pm Sunday   . [facility] [place]                  Get Driving Directions   [address] [place], [place] [address] . 8 am to 8 pm Monday-Friday . 8 am to 4 pm Saturday-Sunday    . [facility] [place]                    Get Driving Directions  [id number]  [address] [place], [place] [address]  . Monday-Friday, 12 PM to 6 PM    Your e-visit answers were reviewed by a board certified advanced clinical practitioner to complete your personal care plan.  Thank you for using e-Visits.

## 2019-03-29 NOTE — Progress Notes (Signed)
Patient pre screened for office appointment, no questions or concerns today. 

## 2019-04-01 ENCOUNTER — Encounter: Payer: Self-pay | Admitting: Oncology

## 2019-04-01 ENCOUNTER — Inpatient Hospital Stay (HOSPITAL_BASED_OUTPATIENT_CLINIC_OR_DEPARTMENT_OTHER): Payer: BC Managed Care – PPO | Admitting: Oncology

## 2019-04-01 ENCOUNTER — Inpatient Hospital Stay: Payer: BC Managed Care – PPO

## 2019-04-01 ENCOUNTER — Other Ambulatory Visit: Payer: Self-pay

## 2019-04-01 VITALS — BP 164/110 | HR 60 | Temp 98.5°F | Resp 18 | Wt 155.7 lb

## 2019-04-01 DIAGNOSIS — C50812 Malignant neoplasm of overlapping sites of left female breast: Secondary | ICD-10-CM

## 2019-04-01 DIAGNOSIS — Z171 Estrogen receptor negative status [ER-]: Secondary | ICD-10-CM | POA: Diagnosis not present

## 2019-04-01 DIAGNOSIS — D500 Iron deficiency anemia secondary to blood loss (chronic): Secondary | ICD-10-CM

## 2019-04-01 DIAGNOSIS — R030 Elevated blood-pressure reading, without diagnosis of hypertension: Secondary | ICD-10-CM

## 2019-04-01 NOTE — Progress Notes (Signed)
Hematology/Oncology Follow up note [facility] ([facility] [phone] [phone]   Patient Care Team: Patient, No Pcp Per as PCP - General (General Practice) Patient, No Pcp Per (General Practice) REASON FOR VISIT Follow up for treatment of triple negative breast cancer and iron deficiency anemia.     HISTORY OF PRESENTING ILLNESS:  Cindy Brewer is a  48 y.o.  female with PMH listed below who was referred to me for evaluation of newly diagnosed breast cancer.  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 two separate foci of invasive mammary carcinoma with associated tumor necrosis, DCIS present, fibroadenoma 26m, Foci 1 is 48m and foci 2 is 358mmargins are negative. Sentinel LN 0/0 involved. Negative LVI declined neoadjuvant or adjuvant chemotherapy  # Genetic testing: BRCA neg Testing did not reveal a pathogenic mutation in any of the genes analyzed.  #Iron deficiency anemia status post multiple IV iron infusions. # Uterus Fibroid disease;  underwent endometrial biopsy    INTERVAL HISTORY SheCristianna Brewer a 47 29o. female who has above history reviewed by me today presents for follow up visit for Triple negative breast cancer and iron deficiency anemia \ Patient continues to feel very fatigued and tired. Heavy menstrual.,  Patient establish care with GYN Dr. JacGlennon Macd had D&C, polypectomy, myomectomy recently. Denies any new breast concerns except that there is asymmetry left in the right breast. Blood pressure is high in the clinic.  She admits to smoke right before coming to the clinic.  Denies any headache Denies any new bone pain, new concerns of her breast. Annual diagnostic mammogram was done on 01/30/2019 which showed no mammographic evidence of malignancy in either breast.  .  Review of Systems   Constitutional: Positive for fatigue. Negative for appetite change, chills and fever.  HENT:   Negative for hearing loss and voice change.   Eyes: Negative for eye problems.  Respiratory: Negative for chest tightness and cough.   Cardiovascular: Negative for chest pain.  Gastrointestinal: Negative for abdominal distention, abdominal pain and blood in stool.  Endocrine: Negative for hot flashes.  Genitourinary: Negative for difficulty urinating and frequency.   Musculoskeletal: Negative for arthralgias.  Skin: Negative for itching and rash.  Neurological: Negative for extremity weakness.  Hematological: Negative for adenopathy.  Psychiatric/Behavioral: Negative for confusion.     MEDICAL HISTORY:  Past Medical History:  Diagnosis Date  . Anemia   . Anxiety   . Arthritis    right knee   . [place]   BREAST- Left  . Depression   . Fibroid   . GERD (gastroesophageal reflux disease)     SURGICAL HISTORY: Past Surgical History:  Procedure Laterality Date  . BREAST BIOPSY Left 2019   [facility]  . BREAST LUMPECTOMY WITH AXILLARY LYMPH NODE BIOPSY Left 11/24/2017   Procedure: LEFT BREAST LUMPECTOMY WITH LEFT SENTINEL NODE LYMPH NODE BIOPSY;  Surgeon: TotJovita KussmaulD;  Location: [facility] ORS;  Service: General;  Laterality: Left;  . CESAREAN SECTION    . EYE SURGERY     DETACHED RETINA 4/519  . HYSTEROSCOPY W/D&C N/A 03/14/2019   Procedure: DILATATION AND CURETTAGE /HYPollyann GlenYOMECTOMY;  Surgeon: JacWill BonnetD;  Location: [facility] ORS;  Service: Gynecology;  Laterality: N/A;  . IR EMBO TUMOR ORGAN ISCHEMIA INFARCT INC GUIDE ROADMAPPING      SOCIAL HISTORY: Social History   Socioeconomic History  .  Marital status: Divorced    Spouse name: Not on file  . Number of children: Not on file  . Years of education: Not on file  . Highest education level: Not on file  Occupational History  . Not on file  Social Needs  . Financial resource strain: Not on file  . Food  insecurity    Worry: Not on file    Inability: Not on file  . Transportation needs    Medical: Not on file    Non-medical: Not on file  Tobacco Use  . Smoking status: Current Every Day Smoker    Packs/day: 0.25    Years: 13.00    Pack years: 3.25    Types: Cigarettes  . Smokeless tobacco: Never Used  Substance and Sexual Activity  . Alcohol use: Yes    Comment: weekends  . Drug use: No  . Sexual activity: Yes    Birth control/protection: None  Lifestyle  . Physical activity    Days per week: 7 days    Minutes per session: 20 min  . Stress: Only a little  Relationships  . Social Herbalist on phone: Once a week    Gets together: Once a week    Attends religious service: Never    Active member of club or organization: No    Attends meetings of clubs or organizations: Never    Relationship status: Divorced  . Intimate partner violence    Fear of current or ex partner: No    Emotionally abused: No    Physically abused: No    Forced sexual activity: No  Other Topics Concern  . Not on file  Social History Narrative   ** Merged History Encounter **        FAMILY HISTORY: Family History  Problem Relation Age of Onset  . Stroke Maternal Grandmother   . Breast cancer Neg Hx     ALLERGIES:  is allergic to demerol [meperidine]; oxycodone; and sulfa antibiotics.  MEDICATIONS:  Current Outpatient Medications  Medication Sig Dispense Refill  . cimetidine (TAGAMET HB) 200 MG tablet Take 200 mg by mouth daily as needed (acid reflux).    [name] ibuprofen (ADVIL) 600 MG tablet Take 1 tablet (600 mg total) by mouth every 6 (six) hours as needed for mild pain, moderate pain or cramping. 30 tablet 0  . metroNIDAZOLE (FLAGYL) 500 MG tablet Take 1 tablet (500 mg total) by mouth 2 (two) times daily for 7 days. 14 tablet 0   No current facility-administered medications for this visit.    Facility-Administered Medications Ordered in Other Visits  Medication Dose Route  Frequency Provider Last Rate Last Dose  . 0.9 %  sodium chloride infusion   Intravenous Continuous Earlie Server, MD 10 mL/hr at 01/11/19 1412       PHYSICAL EXAMINATION: ECOG PERFORMANCE STATUS: 1 - Symptomatic but completely ambulatory Vitals:   04/01/19 1309  BP: (!) 164/110  Pulse: 60  Resp: 18  Temp: 98.5 F (36.9 C)   Filed Weights   04/01/19 1309  Weight: 155 lb 11.2 oz (70.6 kg)    Physical Exam  Constitutional: She is oriented to person, place, and time. No distress.  HENT:  Head: Normocephalic and atraumatic.  Nose: Nose normal.  Mouth/Throat: Oropharynx is clear and moist. No oropharyngeal exudate.  Eyes: Pupils are equal, round, and reactive to light. EOM are normal. Left eye exhibits no discharge. No scleral icterus.  Neck: Normal range of motion. Neck supple. No  JVD present.  Cardiovascular: Normal rate, regular rhythm and normal heart sounds.  No murmur heard. Pulmonary/Chest: Effort normal and breath sounds normal. No respiratory distress. She has no wheezes. She has no rales. She exhibits no tenderness.  Abdominal: Soft. Bowel sounds are normal. She exhibits no distension and no mass. There is no abdominal tenderness. There is no rebound.  Musculoskeletal: Normal range of motion.        General: No tenderness or edema.  Lymphadenopathy:    She has no cervical adenopathy.  Neurological: She is alert and oriented to person, place, and time. No cranial nerve deficit. She exhibits normal muscle tone. Coordination normal.  Skin: Skin is warm and dry. No rash noted. She is not diaphoretic. No erythema.  Psychiatric: Memory, affect and judgment normal.  Decline breast exam today.       LABORATORY DATA:  I have reviewed the data as listed Lab Results  Component Value Date   WBC 3.8 (L) 03/29/2019   HGB 8.9 (L) 03/29/2019   HCT 30.4 (L) 03/29/2019   MCV 86.6 03/29/2019   PLT 252 03/29/2019   Recent Labs    04/27/18 1257  NA 141  K 3.2*  CL 107  CO2 26   GLUCOSE 108*  BUN 13  CREATININE 0.64  CALCIUM 9.2  GFRNONAA >60  GFRAA >60  PROT 7.4  ALBUMIN 3.9  AST 14*  ALT 8  ALKPHOS 32*  BILITOT 0.6    CT renal scan 01/20/2018  independently reviewed by me.  No evidence of metastasis.    ASSESSMENT & PLAN:  Cancer Staging Malignant neoplasm of overlapping sites of left breast in female, estrogen receptor negative ([place]) Staging form: Breast, AJCC 8th Edition - Clinical stage from 08/25/2017: Stage IIB (cT2(2), cN0, cM0, G3, ER: Negative, PR: Negative, HER2: Negative) - Signed by Earlie Server, MD on 08/25/2017  1. Iron deficiency anemia due to chronic blood loss   2. Malignant neoplasm of overlapping sites of left breast in female, estrogen receptor negative ([place])   3. Elevated blood pressure reading    # 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 High recurrence risk.  Mammogram was independently reviewed by me and discussed with patient.  No radiographic evidence of recurrence Recommend annual mammogram for surveillance.  Clinically doing well..   # Iron deficiency anemia due to ongoing menorrhagia.  Labs are reviewed and discussed with patient.  indicates iron deficiency anemia. Recommend proceed IV Venofer 200 mg x 4 Patient has high blood pressure with BP 164/110 today.  She does not have hypertension diagnosed previously. Hold Venofer today.  Reschedule.  #Elevated blood pressure, discussed with patient of cutting down salt intake.  She does not have primary care provider.  I will refer to Dr. Rosanna Randy to establish care.  #Uterus fibroid disease/menorrhagia, continue follow-up with GYN. # Thyroid nodule, previously ordered thyroid ultrasound not done.   Return of visit: 3 months   Orders Placed This Encounter  Procedures  . CBC with Differential/Platelet    Standing Status:   Future    Standing Expiration Date:   03/31/2020  . Iron and TIBC    Standing  Status:   Future    Standing Expiration Date:   03/31/2020  . Ferritin    Standing Status:   Future    Standing Expiration Date:   03/31/2020  . Comprehensive metabolic panel    Standing Status:   Future    Standing Expiration Date:  03/31/2020  . Ambulatory referral to Family Practice    Referral Priority:   Routine    Referral Type:   Consultation    Referral Reason:   Specialty Services Required    Referred to Provider:   Jerrol Banana., MD    Requested Specialty:   [facility]   We spent sufficient time to discuss many aspect of care, questions were answered to patient's satisfaction. Total face to face encounter time for this patient visit was 25 min. >50% of the time was  spent in counseling and coordination of care.     Earlie Server, MD, PhD 04/01/2019

## 2019-04-04 ENCOUNTER — Other Ambulatory Visit: Payer: Self-pay

## 2019-04-04 ENCOUNTER — Emergency Department
Admission: EM | Admit: 2019-04-04 | Discharge: 2019-04-04 | Disposition: A | Discharge status: Home / Self Care | Payer: BC Managed Care – PPO | Attending: Emergency Medicine | Admitting: Emergency Medicine

## 2019-04-04 DIAGNOSIS — Z5321 Procedure and treatment not carried out due to patient leaving prior to being seen by health care provider: Secondary | ICD-10-CM | POA: Diagnosis not present

## 2019-04-04 DIAGNOSIS — R519 Headache, unspecified: Secondary | ICD-10-CM | POA: Diagnosis not present

## 2019-04-04 DIAGNOSIS — I10 Essential (primary) hypertension: Secondary | ICD-10-CM | POA: Diagnosis present

## 2019-04-04 DIAGNOSIS — R002 Palpitations: Secondary | ICD-10-CM | POA: Diagnosis not present

## 2019-04-04 LAB — COMPREHENSIVE METABOLIC PANEL
ALT: 12 U/L (ref 0–44)
AST: 17 U/L (ref 15–41)
Albumin: 4.7 g/dL (ref 3.5–5.0)
Alkaline Phosphatase: 35 U/L — ABNORMAL LOW (ref 38–126)
Anion gap: 11 (ref 5–15)
BUN: 15 mg/dL (ref 6–20)
CO2: 26 mmol/L (ref 22–32)
Calcium: 10.1 mg/dL (ref 8.9–10.3)
Chloride: 101 mmol/L (ref 98–111)
Creatinine, Ser: 0.71 mg/dL (ref 0.44–1.00)
GFR calc Af Amer: >60 mL/min (ref >60)
GFR calc non Af Amer: >60 mL/min (ref >60)
Glucose, Bld: 107 mg/dL — ABNORMAL HIGH (ref 70–99)
Potassium: 4 mmol/L (ref 3.5–5.1)
Sodium: 138 mmol/L (ref 135–145)
Total Bilirubin: 0.5 mg/dL (ref 0.3–1.2)
Total Protein: 8.4 g/dL — ABNORMAL HIGH (ref 6.5–8.1)

## 2019-04-04 LAB — CBC
HCT: 34 % — ABNORMAL LOW (ref 36.0–46.0)
Hemoglobin: 10.2 g/dL — ABNORMAL LOW (ref 12.0–15.0)
MCH: 25.6 pg — ABNORMAL LOW (ref 26.0–34.0)
MCHC: 30 g/dL (ref 30.0–36.0)
MCV: 85.4 fL (ref 80.0–100.0)
Platelets: 269 10*3/uL (ref 150–400)
RBC: 3.98 MIL/uL (ref 3.87–5.11)
RDW: 18 % — ABNORMAL HIGH (ref 11.5–15.5)
WBC: 5.2 10*3/uL (ref 4.0–10.5)
nRBC: 0 % (ref 0.0–0.2)

## 2019-04-04 LAB — TROPONIN I (HIGH SENSITIVITY): Troponin I (High Sensitivity): 3 ng/L (ref <18)

## 2019-04-04 NOTE — ED Triage Notes (Signed)
Pt in with co htn since Monday denies any hx of the same. Has had episodes of palpitations and head pressure. States tonight was 188/103 at home.

## 2019-04-05 ENCOUNTER — Encounter: Payer: Self-pay | Admitting: Emergency Medicine

## 2019-04-05 ENCOUNTER — Emergency Department
Admission: EM | Admit: 2019-04-05 | Discharge: 2019-04-05 | Disposition: A | Discharge status: Home / Self Care | Payer: BC Managed Care – PPO | Attending: Student | Admitting: Student

## 2019-04-05 ENCOUNTER — Other Ambulatory Visit: Payer: Self-pay

## 2019-04-05 DIAGNOSIS — F17210 Nicotine dependence, cigarettes, uncomplicated: Secondary | ICD-10-CM | POA: Insufficient documentation

## 2019-04-05 DIAGNOSIS — R030 Elevated blood-pressure reading, without diagnosis of hypertension: Secondary | ICD-10-CM | POA: Diagnosis present

## 2019-04-05 DIAGNOSIS — R000 Tachycardia, unspecified: Secondary | ICD-10-CM | POA: Diagnosis not present

## 2019-04-05 DIAGNOSIS — Z853 Personal history of malignant neoplasm of breast: Secondary | ICD-10-CM | POA: Diagnosis not present

## 2019-04-05 NOTE — Discharge Instructions (Addendum)
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  Return back to ED if condition worsens.  Take 3-day readings with you when you follow-up with your treating doctor next week.

## 2019-04-05 NOTE — ED Provider Notes (Signed)
[facility] Emergency Department Provider Note   ____________________________________________   First MD Initiated Contact with Patient 04/05/19 1350     (approximate)  I have reviewed the triage vital signs and the nursing notes.   HISTORY  Chief Complaint Hypertension    HPI Cindy Brewer is a 48 y.o. female patient here today secondary to elevated blood pressure.  Patient states she was seen by her oncologist earlier this week told her blood pressure was elevated so did not get her iron infusion.  Her blood pressure in the clinic was 164/100.  Patient has no previous history of hypertension.  Patient state yesterday she took her blood pressure at home and it was 188/104.  Patient came to the ED yesterday but left after having blood work done because the wait was too long.  Patient stated today she had  neck pain and head pressure.  Patient denies the symptoms today.  She also denies blurry vision or vertigo.  Patient appears in no acute distress.         Past Medical History:  Diagnosis Date  . Anemia   . Anxiety   . Arthritis    right knee   . Cancer ([place]) 2019   BREAST- Left  . Depression   . Fibroid   . GERD (gastroesophageal reflux disease)     Patient Active Problem List   Diagnosis Date Noted  . Fibroid uterus 03/08/2018  . Menorrhagia with irregular cycle 03/08/2018  . Iron deficiency anemia 02/28/2018  . Malignant neoplasm of overlapping sites of left breast in female, estrogen receptor negative ([place]) 08/25/2017    Past Surgical History:  Procedure Laterality Date  . BREAST BIOPSY Left 2019   [facility]  . BREAST LUMPECTOMY WITH AXILLARY LYMPH NODE BIOPSY Left 11/24/2017   Procedure: LEFT BREAST LUMPECTOMY WITH LEFT SENTINEL NODE LYMPH NODE BIOPSY;  Surgeon: Jovita Kussmaul, MD;  Location: [facility] ORS;  Service: General;  Laterality: Left;  . CESAREAN SECTION    . EYE SURGERY     DETACHED RETINA 4/519  . HYSTEROSCOPY W/D&C N/A  03/14/2019   Procedure: DILATATION AND CURETTAGE Pollyann Glen, MYOMECTOMY;  Surgeon: Will Bonnet, MD;  Location: [facility] ORS;  Service: Gynecology;  Laterality: N/A;  . IR EMBO TUMOR ORGAN ISCHEMIA INFARCT INC GUIDE ROADMAPPING      Prior to Admission medications   Medication Sig Start Date End Date Taking? Authorizing Provider  cimetidine (TAGAMET HB) 200 MG tablet Take 200 mg by mouth daily as needed (acid reflux).    [provider]  ibuprofen (ADVIL) 600 MG tablet Take 1 tablet (600 mg total) by mouth every 6 (six) hours as needed for mild pain, moderate pain or cramping. 03/14/19   Will Bonnet, MD    Allergies Demerol [meperidine], Oxycodone, Sulfa antibiotics, and Percocet [oxycodone-acetaminophen]  Family History  Problem Relation Age of Onset  . Stroke Maternal Grandmother   . Breast cancer Neg Hx     Social History Social History   Tobacco Use  . Smoking status: Current Every Day Smoker    Packs/day: 0.25    Years: 13.00    Pack years: 3.25    Types: Cigarettes  . Smokeless tobacco: Never Used  Substance Use Topics  . Alcohol use: Yes    Comment: weekends  . Drug use: No    Review of Systems  Constitutional: No fever/chills Eyes: No visual changes. ENT: No sore throat. Cardiovascular: Denies chest pain. Respiratory: Denies shortness of breath. Gastrointestinal: No  abdominal pain.  No nausea, no vomiting.  No diarrhea.  No constipation. Genitourinary: Negative for dysuria. Musculoskeletal: Negative for back pain. Skin: Negative for rash. Neurological: Negative for headaches, focal weakness or numbness. Psychiatric:  Anxiety and depression. Hematological/Lymphatic:  Anemia and breast cancer. Allergic/Immunilogical: See allergy list. ____________________________________________   PHYSICAL EXAM:  VITAL SIGNS: ED Triage Vitals  Enc Vitals Group     BP 04/05/19 1327 (!) 138/95     Pulse Rate 04/05/19 1327 (!) 105     Resp 04/05/19  1327 16     Temp 04/05/19 1327 98.6 F (37 C)     Temp Source 04/05/19 1327 Oral     SpO2 04/05/19 1327 99 %     Weight 04/05/19 1328 155 lb (70.3 kg)     Height 04/05/19 1328 5\' 6"  (1.676 m)     Head Circumference --      Peak Flow --      Pain Score 04/05/19 1328 4     Pain Loc --      Pain Edu? --      Excl. in [place]? --     Constitutional: Alert and oriented. Well appearing and in no acute distress. Eyes: Conjunctivae are normal. PERRL. EOMI. Head: Atraumatic. Nose: No congestion/rhinnorhea. Mouth/Throat: Mucous membranes are moist.  Oropharynx non-erythematous. Neck: No stridor.   Hematological/Lymphatic/Immunilogical: No cervical lymphadenopathy. Cardiovascular: Normal rate, regular rhythm. Grossly normal heart sounds.  Good peripheral circulation. Respiratory: Normal respiratory effort.  No retractions. Lungs CTAB. Musculoskeletal: No lower extremity tenderness nor edema.  No joint effusions. Neurologic:  Normal speech and language. No gross focal neurologic deficits are appreciated. No gait instability. Skin:  Skin is warm, dry and intact. No rash noted. Psychiatric: Mood and affect are normal. Speech and behavior are normal.  ____________________________________________   LABS (all labs ordered are listed, but only abnormal results are displayed)  Labs Reviewed - No data to display ____________________________________________  EKG   ____________________________________________  RADIOLOGY  ED MD interpretation:    Official radiology report(s): No results found.  ____________________________________________   PROCEDURES  Procedure(s) performed (including Critical Care):  Procedures   ____________________________________________   INITIAL IMPRESSION / ASSESSMENT AND PLAN / ED COURSE  As part of my medical decision making, I reviewed the following data within the [place] was evaluated in Emergency  Department on 04/05/2019 for the symptoms described in the history of present illness. She was evaluated in the context of the global COVID-19 pandemic, which necessitated consideration that the patient might be at risk for infection with the SARS-CoV-2 virus that causes COVID-19. Institutional protocols and algorithms that pertain to the evaluation of patients at risk for COVID-19 are in a state of rapid change based on information released by regulatory bodies including the CDC and federal and state organizations. These policies and algorithms were followed during the patient's care in the ED.  Patient presents today with concern of elevated blood pressure.  Blood pressure today was 138/95 which places her in the borderline category.  Patient was mildly tachycardic but asymptomatic.  Advised 3-day blood pressure check using her home machine and a unit at a health care facility.  Patient advised to keep scheduled appointment for follow-up with Dr. Rosanna Randy her PCP new BP readings.  Return back to ED if condition worsens.     ____________________________________________   FINAL CLINICAL IMPRESSION(S) / ED DIAGNOSES  Final diagnoses:  Single episode of elevated blood  pressure     ED Discharge Orders    None       Note:  This document was prepared using Dragon voice recognition software and may include unintentional dictation errors.    Sable Feil, PA-C 04/05/19 1411    Lilia Pro., MD 04/05/19 [phone]

## 2019-04-05 NOTE — ED Triage Notes (Signed)
Patient presents to the ED with hypertension yesterday of 188/104.  Patient states approx. 1 week ago she went to her doctor's for an iron infusion and was told her bp was too high to have the infusion done.  Since then patient has been monitoring blood pressure and it was high yesterday.  Patient came to the ED but the wait was very long so she left.  Patient states she feels some neck pain and "pressure" in her head.  Patient denies blurry vision or dizziness.  Patient is in no obvious distress at this time.

## 2019-04-12 ENCOUNTER — Inpatient Hospital Stay: Payer: BC Managed Care – PPO | Attending: Oncology

## 2019-04-12 ENCOUNTER — Other Ambulatory Visit: Payer: Self-pay

## 2019-04-12 VITALS — BP 138/88 | HR 82 | Temp 98.2°F | Resp 18

## 2019-04-12 DIAGNOSIS — F17210 Nicotine dependence, cigarettes, uncomplicated: Secondary | ICD-10-CM | POA: Insufficient documentation

## 2019-04-12 DIAGNOSIS — R030 Elevated blood-pressure reading, without diagnosis of hypertension: Secondary | ICD-10-CM | POA: Insufficient documentation

## 2019-04-12 DIAGNOSIS — N920 Excessive and frequent menstruation with regular cycle: Secondary | ICD-10-CM | POA: Diagnosis not present

## 2019-04-12 DIAGNOSIS — E041 Nontoxic single thyroid nodule: Secondary | ICD-10-CM | POA: Insufficient documentation

## 2019-04-12 DIAGNOSIS — Z171 Estrogen receptor negative status [ER-]: Secondary | ICD-10-CM | POA: Diagnosis not present

## 2019-04-12 DIAGNOSIS — D500 Iron deficiency anemia secondary to blood loss (chronic): Secondary | ICD-10-CM | POA: Diagnosis not present

## 2019-04-12 DIAGNOSIS — D259 Leiomyoma of uterus, unspecified: Secondary | ICD-10-CM | POA: Diagnosis not present

## 2019-04-12 DIAGNOSIS — C50812 Malignant neoplasm of overlapping sites of left female breast: Secondary | ICD-10-CM | POA: Diagnosis present

## 2019-04-12 DIAGNOSIS — F419 Anxiety disorder, unspecified: Secondary | ICD-10-CM | POA: Insufficient documentation

## 2019-04-12 MED ORDER — SODIUM CHLORIDE 0.9 % IV SOLN
Freq: Once | INTRAVENOUS | Status: AC
  Administered 2019-04-12: 14:00:00 via INTRAVENOUS
  Filled 2019-04-12: qty 250

## 2019-04-12 MED ORDER — IRON SUCROSE 20 MG/ML IV SOLN
200.0000 mg | Freq: Once | INTRAVENOUS | Status: AC
  Administered 2019-04-12: 200 mg via INTRAVENOUS
  Filled 2019-04-12: qty 10

## 2019-04-19 ENCOUNTER — Inpatient Hospital Stay: Payer: BC Managed Care – PPO

## 2019-04-25 ENCOUNTER — Other Ambulatory Visit: Payer: Self-pay

## 2019-04-26 ENCOUNTER — Inpatient Hospital Stay: Payer: BC Managed Care – PPO

## 2019-04-26 ENCOUNTER — Other Ambulatory Visit: Payer: Self-pay

## 2019-04-26 VITALS — BP 126/86 | HR 83 | Temp 98.0°F | Resp 17

## 2019-04-26 DIAGNOSIS — Z171 Estrogen receptor negative status [ER-]: Secondary | ICD-10-CM

## 2019-04-26 DIAGNOSIS — C50812 Malignant neoplasm of overlapping sites of left female breast: Secondary | ICD-10-CM | POA: Diagnosis not present

## 2019-04-26 MED ORDER — IRON SUCROSE 20 MG/ML IV SOLN
200.0000 mg | Freq: Once | INTRAVENOUS | Status: AC
  Administered 2019-04-26: 200 mg via INTRAVENOUS
  Filled 2019-04-26: qty 10

## 2019-04-26 MED ORDER — SODIUM CHLORIDE 0.9 % IV SOLN
Freq: Once | INTRAVENOUS | Status: AC
  Administered 2019-04-26: 14:00:00 via INTRAVENOUS
  Filled 2019-04-26: qty 250

## 2019-05-03 ENCOUNTER — Inpatient Hospital Stay: Payer: BC Managed Care – PPO

## 2019-05-03 ENCOUNTER — Other Ambulatory Visit: Payer: Self-pay

## 2019-05-03 VITALS — BP 151/88 | HR 84 | Temp 98.1°F | Resp 18

## 2019-05-03 DIAGNOSIS — Z171 Estrogen receptor negative status [ER-]: Secondary | ICD-10-CM

## 2019-05-03 DIAGNOSIS — D500 Iron deficiency anemia secondary to blood loss (chronic): Secondary | ICD-10-CM

## 2019-05-03 DIAGNOSIS — C50812 Malignant neoplasm of overlapping sites of left female breast: Secondary | ICD-10-CM

## 2019-05-03 MED ORDER — SODIUM CHLORIDE 0.9 % IV SOLN
Freq: Once | INTRAVENOUS | Status: AC
  Administered 2019-05-03: 14:00:00 via INTRAVENOUS
  Filled 2019-05-03: qty 250

## 2019-05-03 MED ORDER — IRON SUCROSE 20 MG/ML IV SOLN
200.0000 mg | Freq: Once | INTRAVENOUS | Status: AC
  Administered 2019-05-03: 200 mg via INTRAVENOUS
  Filled 2019-05-03: qty 10

## 2019-05-10 ENCOUNTER — Other Ambulatory Visit: Payer: Self-pay

## 2019-05-10 ENCOUNTER — Inpatient Hospital Stay: Payer: BC Managed Care – PPO | Attending: Oncology

## 2019-05-10 VITALS — BP 150/91 | HR 68 | Temp 98.8°F | Resp 18

## 2019-05-10 DIAGNOSIS — C50812 Malignant neoplasm of overlapping sites of left female breast: Secondary | ICD-10-CM | POA: Insufficient documentation

## 2019-05-10 DIAGNOSIS — E041 Nontoxic single thyroid nodule: Secondary | ICD-10-CM | POA: Insufficient documentation

## 2019-05-10 DIAGNOSIS — Z79899 Other long term (current) drug therapy: Secondary | ICD-10-CM | POA: Insufficient documentation

## 2019-05-10 DIAGNOSIS — F17210 Nicotine dependence, cigarettes, uncomplicated: Secondary | ICD-10-CM | POA: Insufficient documentation

## 2019-05-10 DIAGNOSIS — F419 Anxiety disorder, unspecified: Secondary | ICD-10-CM | POA: Diagnosis not present

## 2019-05-10 DIAGNOSIS — N920 Excessive and frequent menstruation with regular cycle: Secondary | ICD-10-CM | POA: Insufficient documentation

## 2019-05-10 DIAGNOSIS — D259 Leiomyoma of uterus, unspecified: Secondary | ICD-10-CM | POA: Diagnosis not present

## 2019-05-10 DIAGNOSIS — D500 Iron deficiency anemia secondary to blood loss (chronic): Secondary | ICD-10-CM | POA: Diagnosis not present

## 2019-05-10 DIAGNOSIS — Z171 Estrogen receptor negative status [ER-]: Secondary | ICD-10-CM | POA: Diagnosis not present

## 2019-05-10 MED ORDER — SODIUM CHLORIDE 0.9 % IV SOLN
Freq: Once | INTRAVENOUS | Status: AC
  Administered 2019-05-10: 14:00:00 via INTRAVENOUS
  Filled 2019-05-10: qty 250

## 2019-05-10 MED ORDER — IRON SUCROSE 20 MG/ML IV SOLN
200.0000 mg | Freq: Once | INTRAVENOUS | Status: AC
  Administered 2019-05-10: 14:00:00 200 mg via INTRAVENOUS
  Filled 2019-05-10: qty 10

## 2019-07-10 ENCOUNTER — Other Ambulatory Visit: Payer: Self-pay

## 2019-07-10 ENCOUNTER — Telehealth: Payer: Self-pay

## 2019-07-10 ENCOUNTER — Inpatient Hospital Stay: Payer: Self-pay | Attending: Oncology

## 2019-07-10 DIAGNOSIS — D500 Iron deficiency anemia secondary to blood loss (chronic): Secondary | ICD-10-CM | POA: Insufficient documentation

## 2019-07-10 DIAGNOSIS — D259 Leiomyoma of uterus, unspecified: Secondary | ICD-10-CM | POA: Insufficient documentation

## 2019-07-10 DIAGNOSIS — Z79899 Other long term (current) drug therapy: Secondary | ICD-10-CM | POA: Insufficient documentation

## 2019-07-10 DIAGNOSIS — E041 Nontoxic single thyroid nodule: Secondary | ICD-10-CM | POA: Insufficient documentation

## 2019-07-10 DIAGNOSIS — F17210 Nicotine dependence, cigarettes, uncomplicated: Secondary | ICD-10-CM | POA: Insufficient documentation

## 2019-07-10 DIAGNOSIS — Z171 Estrogen receptor negative status [ER-]: Secondary | ICD-10-CM | POA: Insufficient documentation

## 2019-07-10 DIAGNOSIS — N920 Excessive and frequent menstruation with regular cycle: Secondary | ICD-10-CM | POA: Insufficient documentation

## 2019-07-10 DIAGNOSIS — C50812 Malignant neoplasm of overlapping sites of left female breast: Secondary | ICD-10-CM | POA: Insufficient documentation

## 2019-07-10 DIAGNOSIS — F419 Anxiety disorder, unspecified: Secondary | ICD-10-CM | POA: Insufficient documentation

## 2019-07-10 LAB — CBC WITH DIFFERENTIAL/PLATELET
Abs Immature Granulocytes: 0.02 10*3/uL (ref 0.00–0.07)
Basophils Absolute: 0 10*3/uL (ref 0.0–0.1)
Basophils Relative: 1 %
Eosinophils Absolute: 0.1 10*3/uL (ref 0.0–0.5)
Eosinophils Relative: 3 %
HCT: 27.9 % — ABNORMAL LOW (ref 36.0–46.0)
Hemoglobin: 8.2 g/dL — ABNORMAL LOW (ref 12.0–15.0)
Immature Granulocytes: 0 %
Lymphocytes Relative: 22 %
Lymphs Abs: 1 10*3/uL (ref 0.7–4.0)
MCH: 28.4 pg (ref 26.0–34.0)
MCHC: 29.4 g/dL — ABNORMAL LOW (ref 30.0–36.0)
MCV: 96.5 fL (ref 80.0–100.0)
Monocytes Absolute: 0.4 10*3/uL (ref 0.1–1.0)
Monocytes Relative: 9 %
Neutro Abs: 2.9 10*3/uL (ref 1.7–7.7)
Neutrophils Relative %: 65 %
Platelets: 353 10*3/uL (ref 150–400)
RBC: 2.89 MIL/uL — ABNORMAL LOW (ref 3.87–5.11)
RDW: 16.3 % — ABNORMAL HIGH (ref 11.5–15.5)
WBC: 4.5 10*3/uL (ref 4.0–10.5)
nRBC: 0 % (ref 0.0–0.2)

## 2019-07-10 LAB — COMPREHENSIVE METABOLIC PANEL
ALT: 11 U/L (ref 0–44)
AST: 16 U/L (ref 15–41)
Albumin: 4 g/dL (ref 3.5–5.0)
Alkaline Phosphatase: 38 U/L (ref 38–126)
Anion gap: 6 (ref 5–15)
BUN: 11 mg/dL (ref 6–20)
CO2: 26 mmol/L (ref 22–32)
Calcium: 8.8 mg/dL — ABNORMAL LOW (ref 8.9–10.3)
Chloride: 108 mmol/L (ref 98–111)
Creatinine, Ser: 0.62 mg/dL (ref 0.44–1.00)
GFR calc Af Amer: >60 mL/min (ref >60)
GFR calc non Af Amer: >60 mL/min (ref >60)
Glucose, Bld: 100 mg/dL — ABNORMAL HIGH (ref 70–99)
Potassium: 3.9 mmol/L (ref 3.5–5.1)
Sodium: 140 mmol/L (ref 135–145)
Total Bilirubin: 0.4 mg/dL (ref 0.3–1.2)
Total Protein: 7.2 g/dL (ref 6.5–8.1)

## 2019-07-10 LAB — IRON AND TIBC
Iron: 23 ug/dL — ABNORMAL LOW (ref 28–170)
Saturation Ratios: 6 % — ABNORMAL LOW (ref 10.4–31.8)
TIBC: 382 ug/dL (ref 250–450)
UIBC: 359 ug/dL

## 2019-07-10 LAB — FERRITIN: Ferritin: 7 ng/mL — ABNORMAL LOW (ref 11–307)

## 2019-07-10 NOTE — Telephone Encounter (Signed)
Message sent by Jordan Hawks: "pt will call tomorrow has to go to work she wants to resh her appt for tomorrow and move to FEB if you want to go ahead and work on that."

## 2019-07-10 NOTE — Telephone Encounter (Signed)
Will she need to have her labs redrawn when she RTC in 08/2019?

## 2019-07-11 NOTE — Telephone Encounter (Signed)
She still has severe iron deficiency and I recommend her to keep her appt or reschedule asap. She will need to have lab work up again if she wishes to schedule to February. Thank you

## 2019-07-11 NOTE — Telephone Encounter (Signed)
Pt had labs (cbc,cmp,ferr,iron) drawn on 1/6 and wants to reschedule Md/poss venofer to February. Please advise on whether she will need labwork drawn again.

## 2019-07-12 ENCOUNTER — Inpatient Hospital Stay: Payer: Medicaid Other

## 2019-07-12 ENCOUNTER — Inpatient Hospital Stay: Payer: Medicaid Other | Admitting: Oncology

## 2019-07-15 NOTE — Telephone Encounter (Signed)
Chrystal Ria Clock is emailing patient a form to fill out and fax back to her.

## 2019-07-15 NOTE — Telephone Encounter (Signed)
Patient informed.  She states she missed the open enrollment window at work for January and will not have another chance to enroll until February so she is currently uninsured.  I have asked our patient advocate if there is a patient assistance that may be able to help during this time.

## 2019-07-22 ENCOUNTER — Ambulatory Visit: Payer: Medicaid Other | Attending: Internal Medicine

## 2019-07-22 DIAGNOSIS — Z20822 Contact with and (suspected) exposure to covid-19: Secondary | ICD-10-CM

## 2019-07-23 LAB — NOVEL CORONAVIRUS, NAA: SARS-CoV-2, NAA: NOT DETECTED

## 2020-01-02 ENCOUNTER — Encounter: Payer: Self-pay | Admitting: Obstetrics and Gynecology

## 2020-01-02 ENCOUNTER — Other Ambulatory Visit: Payer: Self-pay

## 2020-01-02 ENCOUNTER — Ambulatory Visit (INDEPENDENT_AMBULATORY_CARE_PROVIDER_SITE_OTHER): Payer: BC Managed Care – PPO | Admitting: Obstetrics and Gynecology

## 2020-01-02 VITALS — Ht 66.0 in | Wt 172.0 lb

## 2020-01-02 DIAGNOSIS — Z1331 Encounter for screening for depression: Secondary | ICD-10-CM | POA: Diagnosis not present

## 2020-01-02 DIAGNOSIS — Z01419 Encounter for gynecological examination (general) (routine) without abnormal findings: Secondary | ICD-10-CM | POA: Diagnosis not present

## 2020-01-02 DIAGNOSIS — Z124 Encounter for screening for malignant neoplasm of cervix: Secondary | ICD-10-CM

## 2020-01-02 DIAGNOSIS — Z1339 Encounter for screening examination for other mental health and behavioral disorders: Secondary | ICD-10-CM | POA: Diagnosis not present

## 2020-01-02 DIAGNOSIS — B977 Papillomavirus as the cause of diseases classified elsewhere: Secondary | ICD-10-CM

## 2020-01-02 DIAGNOSIS — N72 Inflammatory disease of cervix uteri: Secondary | ICD-10-CM

## 2020-01-02 NOTE — Progress Notes (Signed)
Gynecology Annual Exam  PCP: Patient, No Pcp Per  Chief Complaint  Patient presents with  . Gynecologic Exam    vasomotor symptoms, hot flashes, mood swings, weight gain   History of Present Illness:  Cindy Brewer is a 49 y.o. [phone] who LMP was Patient's last menstrual period was 08/22/2019., presents today for her annual examination.  Her menses are absent since February.  She believes she is still anemic due to her cravings for non-edible foods. Her energy is down.  She denies skin and hair changes, and denies new constipation.    She does have vasomotor sx. She uses no meds. She is using ice packs and air conditioning.  She gets irritated. She also notes a significant weight gain. She has been eating a lot more, craving junk food and sugar.  She has cut back on her drinking and she thinks this is when the cravings started.   She is sexually active. She does have vaginal dryness.  Her pain is on the right lower quadrant.   Last Pap: 03/2018  Results were: no abnormalities /HPV DNA positive (16/18 negative) Hx of STDs: distant.  Nothing recent.   Last mammogram: 01/2019. History of breast cancer.  Results were: normal--routine follow-up in 12 months. She is followed by the cancer center.   There is no FH of breast cancer. There is no FH of ovarian cancer. She has a personal history of breast cancer.  She has her breast exams with Dr. Tasia Catchings.    Colonoscopy: never had.  DEXA: has not been screened for osteoporosis  Tobacco use: smokes 5-6 cigs per day Alcohol use: significant.  AUDIT = 17.   Exercise: no  The patient wears seatbelts: yes.     Past Medical History:  Diagnosis Date  . Anemia   . Anxiety   . Arthritis    right knee   . Cancer ([place]) 2019   BREAST- Left  . Depression   . Fibroid   . GERD (gastroesophageal reflux disease)     Past Surgical History:  Procedure Laterality Date  . BREAST BIOPSY Left 2019   [facility]  . BREAST LUMPECTOMY WITH AXILLARY LYMPH  NODE BIOPSY Left 11/24/2017   Procedure: LEFT BREAST LUMPECTOMY WITH LEFT SENTINEL NODE LYMPH NODE BIOPSY;  Surgeon: Jovita Kussmaul, MD;  Location: [facility] ORS;  Service: General;  Laterality: Left;  . CESAREAN SECTION    . EYE SURGERY     DETACHED RETINA 4/519  . HYSTEROSCOPY WITH D & C N/A 03/14/2019   Procedure: DILATATION AND CURETTAGE Pollyann Glen, MYOMECTOMY;  Surgeon: Will Bonnet, MD;  Location: [facility] ORS;  Service: Gynecology;  Laterality: N/A;  . IR EMBO TUMOR ORGAN ISCHEMIA INFARCT INC GUIDE ROADMAPPING      Prior to Admission medications   Medication Sig Start Date End Date Taking? Authorizing Provider  cimetidine (TAGAMET HB) 200 MG tablet Take 200 mg by mouth daily as needed (acid reflux).   Yes [provider]    Allergies  Allergen Reactions  . Demerol [Meperidine] Anaphylaxis    Pt does not tolerate pain killers.  . Oxycodone Anaphylaxis  . Sulfa Antibiotics Anaphylaxis  . Percocet [Oxycodone-Acetaminophen] Swelling   Obstetric History: [id number]  Family History  Problem Relation Age of Onset  . Stroke Maternal Grandmother   . Breast cancer Neg Hx     Social History   Socioeconomic History  . Marital status: Divorced    Spouse name: Not on file  . Number of children: Not  on file  . Years of education: Not on file  . Highest education level: Not on file  Occupational History  . Not on file  Tobacco Use  . Smoking status: Current Every Day Smoker    Packs/day: 0.25    Years: 13.00    Pack years: 3.25    Types: Cigarettes  . Smokeless tobacco: Never Used  Vaping Use  . Vaping Use: Never used  Substance and Sexual Activity  . Alcohol use: Yes    Comment: weekends  . Drug use: No  . Sexual activity: Yes    Birth control/protection: None  Other Topics Concern  . Not on file  Social History Narrative   ** Merged History Encounter **       Social Determinants of Health   Financial Resource Strain:   . Difficulty of Paying Living  Expenses:   Food Insecurity:   . Worried About Charity fundraiser in the Last Year:   . Arboriculturist in the Last Year:   Transportation Needs:   . Film/video editor (Medical):   [name] Lack of Transportation (Non-Medical):   Physical Activity:   . Days of Exercise per Week:   . Minutes of Exercise per Session:   Stress:   . Feeling of Stress :   Social Connections:   . Frequency of Communication with Friends and Family:   . Frequency of Social Gatherings with Friends and Family:   . Attends Religious Services:   . Active Member of Clubs or Organizations:   . Attends Archivist Meetings:   [name] Marital Status:   Intimate Partner Violence:   . Fear of Current or Ex-Partner:   . Emotionally Abused:   [name] Physically Abused:   . Sexually Abused:     Review of Systems  Constitutional: Negative.   HENT: Negative.   Eyes: Negative.   Respiratory: Negative.   Cardiovascular: Negative.   Gastrointestinal: Negative.   Genitourinary: Negative.   Musculoskeletal: Negative.   Skin: Negative.   Neurological: Negative.   Psychiatric/Behavioral: Negative.      Physical Exam Ht 5\' 6"  (1.676 m)   Wt 172 lb (78 kg)   LMP 08/22/2019   BMI 27.76 kg/m   Physical Exam Constitutional:      General: She is not in acute distress.    Appearance: Normal appearance. She is well-developed.  Genitourinary:     Pelvic exam was performed with patient in the lithotomy position.     Vulva, inguinal canal, urethra, bladder, vagina, uterus, right adnexa and left adnexa normal.     No posterior fourchette tenderness, injury or lesion present.     No cervical friability, lesion, bleeding or polyp.  HENT:     Head: Normocephalic and atraumatic.  Eyes:     General: No scleral icterus.    Conjunctiva/sclera: Conjunctivae normal.  Cardiovascular:     Rate and Rhythm: Normal rate and regular rhythm.     Heart sounds: No murmur heard.  No friction rub. No gallop.   Pulmonary:     Effort:  Pulmonary effort is normal. No respiratory distress.     Breath sounds: Normal breath sounds. No wheezing or rales.  Chest:     Comments: Deferred Abdominal:     General: Bowel sounds are normal. There is no distension.     Palpations: Abdomen is soft. There is no mass.     Tenderness: There is no abdominal tenderness. There is no guarding or rebound.  Musculoskeletal:        General: Normal range of motion.     Cervical back: Normal range of motion and neck supple.  Neurological:     General: No focal deficit present.     Mental Status: She is alert and oriented to person, place, and time.     Cranial Nerves: No cranial nerve deficit.  Skin:    General: Skin is warm and dry.     Findings: No erythema.  Psychiatric:        Mood and Affect: Mood normal.        Behavior: Behavior normal.        Judgment: Judgment normal.     Female chaperone present for pelvic and breast  portions of the physical exam  Results: AUDIT Questionnaire (screen for alcoholism): 17 PHQ-9: 17  Assessment: 49 y.o. [id number] female here for routine gynecologic examination.  Plan: Problem List Items Addressed This Visit    None    Visit Diagnoses    Women's annual routine gynecological examination    -  Primary   Screening for depression       Screening for alcoholism       Pap smear for cervical cancer screening       High risk human papilloma virus (HPV) infection of cervix          Screening: -- Blood pressure screen elevated, needs PCP -- Colonoscopy - due. Will schedule when she has insurance -- Mammogram - will be arranged through cancer center -- Weight screening: overweight: continue to monitor -- Depression screening negative (PHQ-9) -- Nutrition: normal -- cholesterol screening: per PCP -- osteoporosis screening: not due -- tobacco screening: using: discussed quitting using the 5 A's -- alcohol screening: AUDIT questionnaire indicates low-risk usage. -- family history of breast  cancer screening: done. not at high risk. -- no evidence of domestic violence or intimate partner violence. -- STD screening: gonorrhea/chlamydia NAAT collected -- pap smear collected per ASCCP guidelines (collected through [facility]) -- has not received COVID19 vaccine.   Patient has multiple issues which need addressing. She has no PCP and no insurance at the moment. She states she is working on getting her insurance situation resolved. Once she has done this she'll let me know.  I will then try to arrange a colonoscopy. She was also encouraged to find a PCP to help her with other issues that are starting to come up.    [name], MD 01/02/2020 3:01 PM

## 2020-05-08 ENCOUNTER — Ambulatory Visit
Admission: RE | Admit: 2020-05-08 | Discharge: 2020-05-08 | Disposition: A | Discharge status: Home / Self Care | Payer: Medicare Other | Source: Ambulatory Visit | Attending: Oncology | Admitting: Oncology

## 2020-05-08 ENCOUNTER — Telehealth: Payer: Self-pay | Admitting: *Deleted

## 2020-05-08 ENCOUNTER — Inpatient Hospital Stay: Payer: Medicare Other

## 2020-05-08 ENCOUNTER — Inpatient Hospital Stay: Payer: Medicare Other | Attending: Oncology | Admitting: Oncology

## 2020-05-08 ENCOUNTER — Encounter: Payer: Self-pay | Admitting: Oncology

## 2020-05-08 VITALS — BP 142/94 | HR 99 | Temp 98.7°F | Wt 178.7 lb

## 2020-05-08 DIAGNOSIS — Z79899 Other long term (current) drug therapy: Secondary | ICD-10-CM | POA: Insufficient documentation

## 2020-05-08 DIAGNOSIS — Z171 Estrogen receptor negative status [ER-]: Secondary | ICD-10-CM | POA: Diagnosis not present

## 2020-05-08 DIAGNOSIS — R0602 Shortness of breath: Secondary | ICD-10-CM | POA: Insufficient documentation

## 2020-05-08 DIAGNOSIS — D509 Iron deficiency anemia, unspecified: Secondary | ICD-10-CM | POA: Diagnosis not present

## 2020-05-08 DIAGNOSIS — C50812 Malignant neoplasm of overlapping sites of left female breast: Secondary | ICD-10-CM | POA: Diagnosis not present

## 2020-05-08 LAB — COMPREHENSIVE METABOLIC PANEL
ALT: 10 U/L (ref 0–44)
AST: 16 U/L (ref 15–41)
Albumin: 4.4 g/dL (ref 3.5–5.0)
Alkaline Phosphatase: 29 U/L — ABNORMAL LOW (ref 38–126)
Anion gap: 9 (ref 5–15)
BUN: 10 mg/dL (ref 6–20)
CO2: 23 mmol/L (ref 22–32)
Calcium: 9.2 mg/dL (ref 8.9–10.3)
Chloride: 105 mmol/L (ref 98–111)
Creatinine, Ser: 0.74 mg/dL (ref 0.44–1.00)
GFR, Estimated: >60 mL/min (ref >60)
Glucose, Bld: 102 mg/dL — ABNORMAL HIGH (ref 70–99)
Potassium: 4 mmol/L (ref 3.5–5.1)
Sodium: 137 mmol/L (ref 135–145)
Total Bilirubin: 0.4 mg/dL (ref 0.3–1.2)
Total Protein: 8.2 g/dL — ABNORMAL HIGH (ref 6.5–8.1)

## 2020-05-08 LAB — CBC WITH DIFFERENTIAL/PLATELET
Abs Immature Granulocytes: 0.02 10*3/uL (ref 0.00–0.07)
Basophils Absolute: 0.1 10*3/uL (ref 0.0–0.1)
Basophils Relative: 1 %
Eosinophils Absolute: 0.2 10*3/uL (ref 0.0–0.5)
Eosinophils Relative: 2 %
HCT: 24.4 % — ABNORMAL LOW (ref 36.0–46.0)
Hemoglobin: 6.9 g/dL — ABNORMAL LOW (ref 12.0–15.0)
Immature Granulocytes: 0 %
Lymphocytes Relative: 18 %
Lymphs Abs: 1.4 10*3/uL (ref 0.7–4.0)
MCH: 23 pg — ABNORMAL LOW (ref 26.0–34.0)
MCHC: 28.3 g/dL — ABNORMAL LOW (ref 30.0–36.0)
MCV: 81.3 fL (ref 80.0–100.0)
Monocytes Absolute: 0.6 10*3/uL (ref 0.1–1.0)
Monocytes Relative: 8 %
Neutro Abs: 5.4 10*3/uL (ref 1.7–7.7)
Neutrophils Relative %: 71 %
Platelets: 291 10*3/uL (ref 150–400)
RBC: 3 MIL/uL — ABNORMAL LOW (ref 3.87–5.11)
RDW: 18.4 % — ABNORMAL HIGH (ref 11.5–15.5)
WBC: 7.6 10*3/uL (ref 4.0–10.5)
nRBC: 0 % (ref 0.0–0.2)

## 2020-05-08 LAB — IRON AND TIBC
Iron: 15 ug/dL — ABNORMAL LOW (ref 28–170)
Saturation Ratios: 3 % — ABNORMAL LOW (ref 10.4–31.8)
TIBC: 461 ug/dL — ABNORMAL HIGH (ref 250–450)
UIBC: 446 ug/dL

## 2020-05-08 LAB — FERRITIN: Ferritin: 5 ng/mL — ABNORMAL LOW (ref 11–307)

## 2020-05-08 NOTE — Progress Notes (Signed)
Patient's fatigue is getting worse.

## 2020-05-08 NOTE — Telephone Encounter (Signed)
FYI... [facility] closed early today so I wasn't able to get her bilat diag mammo - asap sched per 05/08/20 los  I will call [facility] 1st thing Monday 11/8. Am and notify pt to make her aware of the sched appt date and time

## 2020-05-08 NOTE — Progress Notes (Signed)
Hematology/Oncology Follow up note [facility] Telephone:([phone] [phone]   Patient Care Team: Patient, No Pcp Per as PCP - General (General Practice) Patient, No Pcp Per (General Practice) Earlie Server, MD as Consulting Physician (Hematology and Oncology) REASON FOR VISIT Follow up for treatment of triple negative breast cancer and iron deficiency anemia.     HISTORY OF PRESENTING ILLNESS:  Cindy Brewer is a  49 y.o.  female with PMH listed below who was referred to me for evaluation of newly diagnosed breast cancer.  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 two separate foci of invasive mammary carcinoma with associated tumor necrosis, DCIS present, fibroadenoma 24m, Foci 1 is 414m and foci 2 is 3519mmargins are negative. Sentinel LN 0/0 involved. Negative LVI declined neoadjuvant or adjuvant chemotherapy  # Genetic testing: BRCA neg Testing did not reveal a pathogenic mutation in any of the genes analyzed.  #Iron deficiency anemia status post multiple IV iron infusions. # Uterus Fibroid disease;  underwent endometrial biopsy establish care with GYN Dr. JacGlennon Macd had D&C, polypectomy, myomectomy   # Thyroid nodule, previously ordered thyroid ultrasound not done. INTERVAL HISTORY Cindy Brewer a 48 29o. female who has above history reviewed by me today presents for follow up visit for Triple negative breast cancer and iron deficiency anemia \ Patient was last seen by me in September 2020, and a lot follow-up afterwards. Patient called to reestablish care. Continue to have chronic fatigue symptoms, and shortness of breath.  No cough. She has gained weight.  She has recently experienced hot flash, irregular menses. Denies any new bone pain.  No concerns of her breast. She is overdue for diagnostic mammogram.   Last was done in July 2020.  .  [place] [employer]  Constitutional: Positive for fatigue. Negative for appetite change, chills and fever.  HENT:   Negative for hearing loss and voice change.   Eyes: Negative for eye problems.  Respiratory: Positive for shortness of breath. Negative for chest tightness and cough.   Cardiovascular: Negative for chest pain.  Gastrointestinal: Negative for abdominal distention, abdominal pain and blood in stool.  Endocrine: Negative for hot flashes.  Genitourinary: Negative for difficulty urinating and frequency.   Musculoskeletal: Negative for arthralgias.  Skin: Negative for itching and rash.  Neurological: Negative for extremity weakness.  Hematological: Negative for adenopathy.  Psychiatric/Behavioral: Negative for confusion.     MEDICAL HISTORY:  Past Medical History:  Diagnosis Date  . Anemia   . Anxiety   . Arthritis    right knee   [place]   BREAST- Left  . Depression   . Fibroid   . GERD (gastroesophageal reflux disease)     SURGICAL HISTORY: Past Surgical History:  Procedure Laterality Date  . BREAST BIOPSY Left 2019   [facility]  . BREAST LUMPECTOMY WITH AXILLARY LYMPH NODE BIOPSY Left 11/24/2017   Procedure: LEFT BREAST LUMPECTOMY WITH LEFT SENTINEL NODE LYMPH NODE BIOPSY;  Surgeon: TotJovita KussmaulD;  Location: [facility] ORS;  Service: General;  Laterality: Left;  . CESAREAN SECTION    . EYE SURGERY     DETACHED RETINA 4/519  . HYSTEROSCOPY WITH D & C N/A 03/14/2019   Procedure: DILATATION AND CURETTAGE /HYPollyann GlenYOMECTOMY;  Surgeon: JacWill BonnetD;  Location: [facility] ORS;  Service: Gynecology;  Laterality: N/A;  . IR EMBO TUMOR ORGAN ISCHEMIA INFARCT INC  GUIDE ROADMAPPING      SOCIAL HISTORY: Social History   Socioeconomic History  . Marital status: Divorced    Spouse name: Not on file  . Number of children: Not on file  . Years of education: Not on file  . Highest education level: Not on file  Occupational  History  . Not on file  Tobacco Use  . Smoking status: Current Every Day Smoker    Packs/day: 0.25    Years: 13.00    Pack years: 3.25    Types: Cigarettes  . Smokeless tobacco: Never Used  Vaping Use  . Vaping Use: Never used  Substance and Sexual Activity  . Alcohol use: Yes    Comment: weekends  . Drug use: No  . Sexual activity: Yes    Birth control/protection: None  Other Topics Concern  . Not on file  Social History Narrative   ** Merged History Encounter **       Social Determinants of Health   Financial Resource Strain:   . Difficulty of Paying Living Expenses: Not on file  Food Insecurity:   . Worried About Charity fundraiser in the Last Year: Not on file  . Ran Out of Food in the Last Year: Not on file  Transportation Needs:   . Lack of Transportation (Medical): Not on file  . Lack of Transportation (Non-Medical): Not on file  Physical Activity:   . Days of Exercise per Week: Not on file  . Minutes of Exercise per Session: Not on file  Stress:   . Feeling of Stress : Not on file  Social Connections:   . Frequency of Communication with Friends and Family: Not on file  . Frequency of Social Gatherings with Friends and Family: Not on file  . Attends Religious Services: Not on file  . Active Member of Clubs or Organizations: Not on file  . Attends Archivist Meetings: Not on file  . Marital Status: Not on file  Intimate Partner Violence:   . Fear of Current or Ex-Partner: Not on file  . Emotionally Abused: Not on file  . Physically Abused: Not on file  . Sexually Abused: Not on file    FAMILY HISTORY: Family History  Problem Relation Age of Onset  . Stroke Maternal Grandmother   . Breast cancer Neg Hx     ALLERGIES:  is allergic to demerol [meperidine], oxycodone, sulfa antibiotics, and percocet [oxycodone-acetaminophen].  MEDICATIONS:  Current Outpatient Medications  Medication Sig Dispense Refill  . cimetidine (TAGAMET HB) 200 MG  tablet Take 200 mg by mouth daily as needed (acid reflux).    [name] diphenhydrAMINE (BENADRYL) 25 mg capsule Take 25 mg by mouth every 6 (six) hours as needed.    . ranitidine (ZANTAC) 75 MG tablet Take 75 mg by mouth daily as needed for heartburn.     No current facility-administered medications for this visit.   Facility-Administered Medications Ordered in Other Visits  Medication Dose Route Frequency Provider Last Rate Last Admin  . 0.9 %  sodium chloride infusion   Intravenous Continuous Earlie Server, MD 10 mL/hr at 01/11/19 1412 New Bag at 01/11/19 1412     PHYSICAL EXAMINATION: ECOG PERFORMANCE STATUS: 1 - Symptomatic but completely ambulatory Vitals:   05/08/20 1413  BP: (!) 142/94  Pulse: 99  Temp: 98.7 F (37.1 C)  SpO2: 100%   Filed Weights   05/08/20 1413  Weight: 178 lb 11.2 oz (81.1 kg)    Physical Exam Constitutional:  General: She is not in acute distress.    Appearance: She is not diaphoretic.  HENT:     Head: Normocephalic and atraumatic.     Nose: Nose normal.     Mouth/Throat:     Pharynx: No oropharyngeal exudate.  Eyes:     General: No scleral icterus.       Left eye: No discharge.     Pupils: Pupils are equal, round, and reactive to light.  Neck:     Vascular: No JVD.  Cardiovascular:     Rate and Rhythm: Normal rate and regular rhythm.     Heart sounds: Normal heart sounds. No murmur heard.   Pulmonary:     Effort: Pulmonary effort is normal. No respiratory distress.     Breath sounds: Normal breath sounds. No wheezing or rales.  Chest:     Chest wall: No tenderness.  Abdominal:     General: Bowel sounds are normal. There is no distension.     Palpations: Abdomen is soft. There is no mass.     Tenderness: There is no abdominal tenderness. There is no rebound.  Musculoskeletal:        General: No tenderness. Normal range of motion.     Cervical back: Normal range of motion and neck supple.  Lymphadenopathy:     Cervical: No cervical  adenopathy.  Skin:    General: Skin is warm and dry.     Coloration: Skin is pale.     Findings: No erythema or rash.  Neurological:     Mental Status: She is alert and oriented to person, place, and time.     Cranial Nerves: No cranial nerve deficit.     Motor: No abnormal muscle tone.     Coordination: Coordination normal.  Psychiatric:        Mood and Affect: Affect normal.        Cognition and Memory: Memory normal.        Judgment: Judgment normal.    Breast exam was performed in seated and lying down position. Patient is status post left lumpectomy with a well-healing surgical scar.no palpable breast masses or axillary lymphadenopathy bilaterally.       LABORATORY DATA:  I have reviewed the data as listed Lab Results  Component Value Date   WBC 7.6 05/08/2020   HGB 6.9 (L) 05/08/2020   HCT 24.4 (L) 05/08/2020   MCV 81.3 05/08/2020   PLT 291 05/08/2020   Recent Labs    07/10/19 1427 05/08/20 1454  NA 140 137  K 3.9 4.0  CL 108 105  CO2 26 23  GLUCOSE 100* 102*  BUN 11 10  CREATININE 0.62 0.74  CALCIUM 8.8* 9.2  GFRNONAA >60 >60  GFRAA >60  --   PROT 7.2 8.2*  ALBUMIN 4.0 4.4  AST 16 16  ALT 11 10  ALKPHOS 38 29*  BILITOT 0.4 0.4    CT renal scan 01/20/2018  independently reviewed by me.  No evidence of metastasis.    ASSESSMENT & PLAN:  Cancer Staging Malignant neoplasm of overlapping sites of left breast in female, estrogen receptor negative ([facility]) Staging form: Breast, AJCC 8th Edition - Clinical stage from 08/25/2017: Stage IIB (cT2(2), cN0, cM0, G3, ER: Negative, PR: Negative, HER2: Negative) - Signed by Earlie Server, MD on 08/25/2017  1. Malignant neoplasm of overlapping sites of left breast in female, estrogen receptor negative ([place])   2. Shortness of breath    # Multifocal triple negative breast cancer clinically  Stage IIB (pT2 pN0,cM0), Declined chemotherapy previously. High recurrence risk.  Overdue for annual mammogram. Will obtain bilateral  diagnostic mammogram.   # Severe iron deficiency anemia.  Check cbc cmp iron tibc ferritin.   # SOB, can be due to anemia.  Check CXR, image is pending.   #Uterus fibroid disease/menorrhagia, continue follow-up with GYN.  I called patient on 05/09/2020 and communicated with her about lab work results. Recommend blood transfusion asap. She also notified me that she cough out bloody mucous. Recommend her to to go to ER for further evaluation and blood transfusion. She voices understanding and agrees the plan.  Our office will reach out to her next week to arrange additional IV iron infusion.   Return of visit: 3 months   Orders Placed This Encounter  Procedures  . MM DIAG BREAST TOMO BILATERAL    Standing Status:   Future    Standing Expiration Date:   05/08/2021    Order Specific Question:   Reason for Exam (SYMPTOM  OR DIAGNOSIS REQUIRED)    Answer:   history of triple negative breast cancer    Order Specific Question:   Is the patient pregnant?    Answer:   No    Order Specific Question:   Preferred imaging location?    Answer:   [facility]  . US Breast Limited Uni Left Inc Axilla    Standing Status:   Future    Standing Expiration Date:   05/08/2021    Order Specific Question:   Reason for Exam (SYMPTOM  OR DIAGNOSIS REQUIRED)    Answer:   history of triple negative breast cancer    Order Specific Question:   Preferred imaging location?    Answer:   [facility]  . US Breast Limited Uni Right Inc Axilla    Standing Status:   Future    Standing Expiration Date:   05/08/2021    Order Specific Question:   Reason for Exam (SYMPTOM  OR DIAGNOSIS REQUIRED)    Answer:   history of triple negative breast cancer    Order Specific Question:   Preferred imaging location?    Answer:   [facility]  . DG Chest 2 View    Standing Status:   Future    Number of Occurrences:   1    Standing Expiration Date:   05/08/2021    Order Specific Question:   Reason for Exam (SYMPTOM   OR DIAGNOSIS REQUIRED)    Answer:   shortness of breath    Order Specific Question:   Is patient pregnant?    Answer:   No    Order Specific Question:   Preferred [facility] location?    [facility]  . CBC with Differential/Platelet    Standing Status:   Future    Number of Occurrences:   1    Standing Expiration Date:   05/08/2021  . Comprehensive metabolic panel    Standing Status:   Future    Number of Occurrences:   1    Standing Expiration Date:   05/08/2021  . Iron and TIBC    Standing Status:   Future    Number of Occurrences:   1    Standing Expiration Date:   05/08/2021  . Ferritin    Standing Status:   Future    Number of Occurrences:   1    Standing Expiration Date:   05/08/2021   We spent sufficient time to discuss many  aspect of care, questions were answered to patient's satisfaction.   Earlie Server, MD, PhD 05/08/2020

## 2020-05-11 ENCOUNTER — Telehealth: Payer: Self-pay

## 2020-05-11 NOTE — Telephone Encounter (Signed)
Tried calling patient for an update but no answer and not able to leave a message due to mailbox full.  I will send a MyChart message.

## 2020-05-11 NOTE — Telephone Encounter (Signed)
I talked to patient and she did not go for blood transfusion over the weekend due to fear/concerns of blood transfusion.  I explained her the safety precautions and processes that are in place for blood transfusion.  She says her daughter gets off at 12:00 am and will talk to her at that time.  If she goes for blood transfusion it will probably be on Wed or Th.  I advised her hgb of 6.9 could be dangerous and for her to be very careful and please think about receiving the blood transfusion.  Patient is very appreciative of my phone call an she is to keep [place] updated.

## 2020-05-11 NOTE — Telephone Encounter (Signed)
-----   Message from Earlie Server, MD sent at 05/09/2020 11:14 AM EDT ----- I recommended her to go to ER for blood transfusion. Hb 6.9 Please follow up her status and arrange additional IV venofer treatment weekly x 4 Follow up in 8 weeks, labs prior MD +/- Venofer.

## 2020-05-11 NOTE — Telephone Encounter (Signed)
Any room to do Venofer or blood transfusion this week? Thanks.

## 2020-05-12 NOTE — Telephone Encounter (Signed)
Will you please cancel the 5th weekly Venofer.  Dr. Tasia Catchings wants weekly x4 and she will be getting first one this week.  Thank you so much for getting her scheduled for tomorrow!

## 2020-05-12 NOTE — Telephone Encounter (Signed)
Please schedule patient for Venofer this week with additional IV venofer treatment weekly x 4 Follow up in 8 weeks, labs prior MD +/- Venofer.  I will let patient know about appts.

## 2020-05-12 NOTE — Telephone Encounter (Signed)
Patient is aware of appointments and will get a copy of future appts while here for the Venofer tomorrow.

## 2020-05-12 NOTE — Telephone Encounter (Signed)
Infusion had a cancellation for tomorrow and can accommodate Venofer but not blood transfusion.

## 2020-05-12 NOTE — Telephone Encounter (Signed)
Done All appts has been scheduled as requested.

## 2020-05-13 ENCOUNTER — Inpatient Hospital Stay: Payer: Medicare Other

## 2020-05-13 ENCOUNTER — Other Ambulatory Visit: Payer: Self-pay

## 2020-05-13 VITALS — BP 133/97 | HR 85 | Temp 99.2°F | Resp 19

## 2020-05-13 DIAGNOSIS — C50812 Malignant neoplasm of overlapping sites of left female breast: Secondary | ICD-10-CM

## 2020-05-13 DIAGNOSIS — Z171 Estrogen receptor negative status [ER-]: Secondary | ICD-10-CM

## 2020-05-13 DIAGNOSIS — D509 Iron deficiency anemia, unspecified: Secondary | ICD-10-CM | POA: Diagnosis not present

## 2020-05-13 MED ORDER — SODIUM CHLORIDE 0.9 % IV SOLN
Freq: Once | INTRAVENOUS | Status: AC
  Filled 2020-05-13: qty 250

## 2020-05-13 MED ORDER — IRON SUCROSE 20 MG/ML IV SOLN
200.0000 mg | Freq: Once | INTRAVENOUS | Status: AC
  Administered 2020-05-13: 200 mg via INTRAVENOUS
  Filled 2020-05-13: qty 10

## 2020-05-13 NOTE — Progress Notes (Signed)
Pt tolerated Venofer infusion well with no signs of complications. RN educated pt on the importance of notifying the clinic if any complications occur at home, pt verbalized understanding. All questions answered at this time. VSS. Pt stable for discharge. Pt discharged home.   Cindy Brewer [employer]

## 2020-05-20 ENCOUNTER — Inpatient Hospital Stay: Payer: Medicare Other

## 2020-05-20 ENCOUNTER — Other Ambulatory Visit: Payer: Self-pay

## 2020-05-20 VITALS — BP 134/86 | HR 84 | Resp 18

## 2020-05-20 DIAGNOSIS — C50812 Malignant neoplasm of overlapping sites of left female breast: Secondary | ICD-10-CM

## 2020-05-20 DIAGNOSIS — D509 Iron deficiency anemia, unspecified: Secondary | ICD-10-CM | POA: Diagnosis not present

## 2020-05-20 MED ORDER — SODIUM CHLORIDE 0.9 % IV SOLN
INTRAVENOUS | Status: DC
  Filled 2020-05-20: qty 250

## 2020-05-20 MED ORDER — IRON SUCROSE 20 MG/ML IV SOLN
200.0000 mg | Freq: Once | INTRAVENOUS | Status: AC
  Administered 2020-05-20: 200 mg via INTRAVENOUS
  Filled 2020-05-20: qty 10

## 2020-05-27 ENCOUNTER — Other Ambulatory Visit: Payer: Self-pay

## 2020-05-27 ENCOUNTER — Inpatient Hospital Stay: Payer: Medicare Other

## 2020-05-27 ENCOUNTER — Ambulatory Visit
Admission: RE | Admit: 2020-05-27 | Discharge: 2020-05-27 | Disposition: A | Discharge status: Home / Self Care | Payer: Medicare Other | Source: Ambulatory Visit | Attending: Oncology | Admitting: Oncology

## 2020-05-27 VITALS — BP 144/84 | HR 87 | Temp 96.8°F | Resp 18

## 2020-05-27 DIAGNOSIS — D509 Iron deficiency anemia, unspecified: Secondary | ICD-10-CM | POA: Diagnosis not present

## 2020-05-27 DIAGNOSIS — Z171 Estrogen receptor negative status [ER-]: Secondary | ICD-10-CM | POA: Diagnosis present

## 2020-05-27 DIAGNOSIS — C50812 Malignant neoplasm of overlapping sites of left female breast: Secondary | ICD-10-CM | POA: Diagnosis present

## 2020-05-27 MED ORDER — SODIUM CHLORIDE 0.9 % IV SOLN
Freq: Once | INTRAVENOUS | Status: AC
  Filled 2020-05-27: qty 250

## 2020-05-27 MED ORDER — IRON SUCROSE 20 MG/ML IV SOLN
200.0000 mg | Freq: Once | INTRAVENOUS | Status: AC
  Administered 2020-05-27: 200 mg via INTRAVENOUS
  Filled 2020-05-27: qty 10

## 2020-05-27 NOTE — Progress Notes (Signed)
0905- Patient tolerated Venofer infusion well. Patient and vital signs stable. Patient discharged to home at this time.

## 2020-06-04 ENCOUNTER — Other Ambulatory Visit: Payer: Self-pay

## 2020-06-04 ENCOUNTER — Inpatient Hospital Stay: Payer: Medicare Other | Attending: Oncology

## 2020-06-04 VITALS — BP 161/95 | HR 73 | Temp 98.0°F

## 2020-06-04 DIAGNOSIS — C50812 Malignant neoplasm of overlapping sites of left female breast: Secondary | ICD-10-CM | POA: Diagnosis not present

## 2020-06-04 DIAGNOSIS — D509 Iron deficiency anemia, unspecified: Secondary | ICD-10-CM | POA: Insufficient documentation

## 2020-06-04 DIAGNOSIS — Z79899 Other long term (current) drug therapy: Secondary | ICD-10-CM | POA: Insufficient documentation

## 2020-06-04 DIAGNOSIS — Z171 Estrogen receptor negative status [ER-]: Secondary | ICD-10-CM | POA: Diagnosis not present

## 2020-06-04 MED ORDER — IRON SUCROSE 20 MG/ML IV SOLN
200.0000 mg | Freq: Once | INTRAVENOUS | Status: AC
  Administered 2020-06-04: 200 mg via INTRAVENOUS
  Filled 2020-06-04: qty 10

## 2020-06-04 MED ORDER — SODIUM CHLORIDE 0.9 % IV SOLN
Freq: Once | INTRAVENOUS | Status: AC
  Filled 2020-06-04: qty 250

## 2020-06-04 NOTE — Progress Notes (Signed)
Pt tolerated Venofer infusion well with no signs of complications. VSS. Pt stable for discharge. RN educated pt on the importance of notifying the clinic if any complications occur at home, pt verbalized understanding and all question answered at this time.   Carina Chaplin [employer]

## 2020-06-11 ENCOUNTER — Ambulatory Visit: Payer: Medicare Other

## 2020-07-01 ENCOUNTER — Emergency Department
Admission: EM | Admit: 2020-07-01 | Discharge: 2020-07-01 | Disposition: A | Discharge status: Home / Self Care | Payer: Medicare Other | Attending: Emergency Medicine | Admitting: Emergency Medicine

## 2020-07-01 ENCOUNTER — Other Ambulatory Visit: Payer: Self-pay

## 2020-07-01 DIAGNOSIS — Z853 Personal history of malignant neoplasm of breast: Secondary | ICD-10-CM | POA: Insufficient documentation

## 2020-07-01 DIAGNOSIS — R059 Cough, unspecified: Secondary | ICD-10-CM | POA: Diagnosis present

## 2020-07-01 DIAGNOSIS — F17210 Nicotine dependence, cigarettes, uncomplicated: Secondary | ICD-10-CM | POA: Insufficient documentation

## 2020-07-01 DIAGNOSIS — U071 COVID-19: Secondary | ICD-10-CM | POA: Insufficient documentation

## 2020-07-01 LAB — POC SARS CORONAVIRUS 2 AG -  ED: SARS Coronavirus 2 Ag: NEGATIVE

## 2020-07-01 LAB — RESP PANEL BY RT-PCR (FLU A&B, COVID) ARPGX2
Influenza A by PCR: NEGATIVE
Influenza B by PCR: NEGATIVE
SARS Coronavirus 2 by RT PCR: POSITIVE — AB

## 2020-07-01 NOTE — ED Notes (Signed)
See triage note. Pt ambulatory to room. Pt c/o sore throat, fever and bodyaches that started yesterday. Pt in NAD.

## 2020-07-01 NOTE — Discharge Instructions (Signed)
Follow-up with your regular doctor as needed.  Return emergency department worsening.  Take over-the-counter Tylenol and ibuprofen for pain/fever as needed take over-the-counter vitamin C, vitamin D, and zinc to help boost your immune system take over-the-counter Mucinex if you develop cough return emergency department as needed you must quarantine for 10 days

## 2020-07-01 NOTE — ED Provider Notes (Signed)
[facility] Emergency Department Provider Note  ____________________________________________   Event Date/Time   First MD Initiated Contact with Patient 07/01/20 1358     (approximate)  I have reviewed the triage vital signs and the nursing notes.   HISTORY  Chief Complaint URI    HPI Cindy Brewer is a 49 y.o. female presents to the emergency department with URI symptoms.  Is complaining of cough, congestion, fever, chills, denies chest pain, denies shortness of breath unknown close contact with Covid19+ patient, patient is not vaccinated.   Past Medical History:  Diagnosis Date  . Anemia   . Anxiety   . Arthritis    right knee   . Cancer (HCC) 2019   BREAST- Left  . Depression   . Fibroid   . GERD (gastroesophageal reflux disease)     Patient Active Problem List   Diagnosis Date Noted  . Fibroid uterus 03/08/2018  . Menorrhagia with irregular cycle 03/08/2018  . Iron deficiency anemia 02/28/2018  . Malignant neoplasm of overlapping sites of left breast in female, estrogen receptor negative (HCC) 08/25/2017    Past Surgical History:  Procedure Laterality Date  . BREAST BIOPSY Left 2019   [facility]  . BREAST LUMPECTOMY Left 2019  . BREAST LUMPECTOMY WITH AXILLARY LYMPH NODE BIOPSY Left 11/24/2017   Procedure: LEFT BREAST LUMPECTOMY WITH LEFT SENTINEL NODE LYMPH NODE BIOPSY;  Surgeon: Griselda Miner, MD;  Location: [facility] ORS;  Service: General;  Laterality: Left;  . CESAREAN SECTION    . EYE SURGERY     DETACHED RETINA 4/519  . HYSTEROSCOPY WITH D & C N/A 03/14/2019   Procedure: DILATATION AND CURETTAGE Melton Krebs, MYOMECTOMY;  Surgeon: Conard Novak, MD;  Location: [facility] ORS;  Service: Gynecology;  Laterality: N/A;  . IR EMBO TUMOR ORGAN ISCHEMIA INFARCT INC GUIDE ROADMAPPING      Prior to Admission medications   Medication Sig Start Date End Date Taking? Authorizing Provider  cimetidine (TAGAMET HB) 200 MG tablet Take 200 mg  by mouth daily as needed (acid reflux).    [provider]  diphenhydrAMINE (BENADRYL) 25 mg capsule Take 25 mg by mouth every 6 (six) hours as needed.    [provider]  ranitidine (ZANTAC) 75 MG tablet Take 75 mg by mouth daily as needed for heartburn.    [provider]    Allergies Demerol [meperidine], Oxycodone, Sulfa antibiotics, and Percocet [oxycodone-acetaminophen]  Family History  Problem Relation Age of Onset  . Stroke Maternal Grandmother   . Breast cancer Neg Hx     Social History Social History   Tobacco Use  . Smoking status: Current Every Day Smoker    Packs/day: 0.25    Years: 13.00    Pack years: 3.25    Types: Cigarettes  . Smokeless tobacco: Never Used  Vaping Use  . Vaping Use: Never used  Substance Use Topics  . Alcohol use: Yes    Comment: weekends  . Drug use: No    Review of Systems  Constitutional: Positive fever/chills Eyes: No visual changes. ENT: Positive sore throat. Respiratory: Positive cough Genitourinary: Negative for dysuria. Musculoskeletal: Negative for back pain. Skin: Negative for rash.    ____________________________________________   PHYSICAL EXAM:  VITAL SIGNS: ED Triage Vitals [07/01/20 1258]  Enc Vitals Group     BP (!) 164/115     Pulse Rate 87     Resp 17     Temp 98.9 F (37.2 C)     Temp  Source Oral     SpO2 97 %     Weight 177 lb (80.3 kg)     Height 5\' 6"  (1.676 m)     Head Circumference      Peak Flow      Pain Score 7     Pain Loc      Pain Edu?      Excl. in [place]?     Constitutional: Alert and oriented. Well appearing and in no acute distress. Eyes: Conjunctivae are normal.  Head: Atraumatic. Nose: No congestion/rhinnorhea. Mouth/Throat: Mucous membranes are moist.   Neck:  supple no lymphadenopathy noted Cardiovascular: Normal rate, regular rhythm. Heart sounds are normal Respiratory: Normal respiratory effort.  No retractions, lungs CTA GU:  deferred Musculoskeletal: FROM all extremities, warm and well perfused Neurologic:  Normal speech and language.  Skin:  Skin is warm, dry and intact. No rash noted. Psychiatric: Mood and affect are normal. Speech and behavior are normal.  ____________________________________________   LABS (all labs ordered are listed, but only abnormal results are displayed)  Labs Reviewed  RESP PANEL BY RT-PCR (FLU A&B, COVID) ARPGX2 - Abnormal; Notable for the following components:      Result Value   SARS Coronavirus 2 by RT PCR POSITIVE (*)    All other components within normal limits  POC SARS CORONAVIRUS 2 AG -  ED   ____________________________________________   ____________________________________________  RADIOLOGY    ____________________________________________   PROCEDURES  Procedure(s) performed: No  Procedures    ____________________________________________   INITIAL IMPRESSION / ASSESSMENT AND PLAN / ED COURSE  Pertinent labs & imaging results that were available during my care of the patient were reviewed by me and considered in my medical decision making (see chart for details).   Patient is a 49 year old female who complains of URI symptoms.  Patient has not vaccinated for Covid exam is consistent with covid.    Negative rapid test for covid Positive PCR test for Covid, negative influenza   The patient was instructed to quarantine themselves at home.  Follow-up with your regular doctor if any concerns.  Return emergency department for worsening. OTC measures discussed     [name] was evaluated in Emergency Department on 07/01/2020 for the symptoms described in the history of present illness. She was evaluated in the context of the global COVID-19 pandemic, which necessitated consideration that the patient might be at risk for infection with the SARS-CoV-2 virus that causes COVID-19. Institutional protocols and algorithms that pertain to the  evaluation of patients at risk for COVID-19 are in a state of rapid change based on information released by regulatory bodies including the CDC and federal and state organizations. These policies and algorithms were followed during the patient's care in the ED.   As part of my medical decision making, I reviewed the following data within the [place] notes reviewed and incorporated, Labs reviewed , Old chart reviewed, Notes from prior ED visits and [place] Controlled Substance Database  ____________________________________________   FINAL CLINICAL IMPRESSION(S) / ED DIAGNOSES  Final diagnoses:  COVID-19      NEW MEDICATIONS STARTED DURING THIS VISIT:  Discharge Medication List as of 07/01/2020  4:02 PM       Note:  This document was prepared using Dragon voice recognition software and may include unintentional dictation errors.    Versie Starks, PA-C 07/01/20 1634    Lucrezia Starch, MD 07/01/20 [phone]

## 2020-07-01 NOTE — ED Triage Notes (Signed)
Pt c/o sore throat, fever and bodyaches since yesterday.

## 2020-07-02 ENCOUNTER — Telehealth: Payer: Self-pay | Admitting: Unknown Physician Specialty

## 2020-07-02 NOTE — Telephone Encounter (Signed)
I connected by phone with Cindy Brewer on 07/02/2020 at 9:05 AM to discuss the potential use of a new treatment for mild to moderate COVID-19 viral infection in non-hospitalized patients.  This patient is a 49 y.o. female that meets the FDA criteria for Emergency Use Authorization of COVID monoclonal antibody casirivimab/imdevimab, bamlanivimab/etesevimab, or sotrovimab.  Has a (+) direct SARS-CoV-2 viral test result  Has mild or moderate COVID-19   Is NOT hospitalized due to COVID-19  Is within 10 days of symptom onset  Has at least one of the high risk factor(s) for progression to severe COVID-19 and/or hospitalization as defined in EUA.  Specific high risk criteria : BMI > 25   I have spoken and communicated the following to the patient or parent/caregiver regarding COVID monoclonal antibody treatment:  1. FDA has authorized the emergency use for the treatment of mild to moderate COVID-19 in adults and pediatric patients with positive results of direct SARS-CoV-2 viral testing who are 22 years of age and older weighing at least 40 kg, and who are at high risk for progressing to severe COVID-19 and/or hospitalization.  2. The significant known and potential risks and benefits of COVID monoclonal antibody, and the extent to which such potential risks and benefits are unknown.  3. Information on available alternative treatments and the risks and benefits of those alternatives, including clinical trials.  4. Patients treated with COVID monoclonal antibody should continue to self-isolate and use infection control measures (e.g., wear mask, isolate, social distance, avoid sharing personal items, clean and disinfect high touch surfaces, and frequent handwashing) according to CDC guidelines.   5. The patient or parent/caregiver has the option to accept or refuse COVID monoclonal antibody treatment.  After reviewing this information with the patient, wants to think about the  information.    Gabriel Cirri, NP 07/02/2020 9:05 AM  Sx onset 12/28

## 2020-07-07 ENCOUNTER — Inpatient Hospital Stay: Payer: Medicare Other

## 2020-07-08 ENCOUNTER — Inpatient Hospital Stay: Payer: Medicare Other | Admitting: Oncology

## 2020-07-08 ENCOUNTER — Inpatient Hospital Stay: Payer: Medicare Other

## 2020-08-03 ENCOUNTER — Inpatient Hospital Stay: Payer: Medicare Other | Attending: Oncology

## 2020-08-03 ENCOUNTER — Other Ambulatory Visit: Payer: Self-pay

## 2020-08-03 DIAGNOSIS — C50812 Malignant neoplasm of overlapping sites of left female breast: Secondary | ICD-10-CM | POA: Insufficient documentation

## 2020-08-03 DIAGNOSIS — D509 Iron deficiency anemia, unspecified: Secondary | ICD-10-CM | POA: Insufficient documentation

## 2020-08-03 DIAGNOSIS — Z79899 Other long term (current) drug therapy: Secondary | ICD-10-CM | POA: Insufficient documentation

## 2020-08-03 DIAGNOSIS — Z171 Estrogen receptor negative status [ER-]: Secondary | ICD-10-CM | POA: Diagnosis not present

## 2020-08-03 DIAGNOSIS — R0602 Shortness of breath: Secondary | ICD-10-CM

## 2020-08-03 LAB — CBC WITH DIFFERENTIAL/PLATELET
Abs Immature Granulocytes: 0.02 10*3/uL (ref 0.00–0.07)
Basophils Absolute: 0.1 10*3/uL (ref 0.0–0.1)
Basophils Relative: 1 %
Eosinophils Absolute: 0.1 10*3/uL (ref 0.0–0.5)
Eosinophils Relative: 2 %
HCT: 42.2 % (ref 36.0–46.0)
Hemoglobin: 13.9 g/dL (ref 12.0–15.0)
Immature Granulocytes: 0 %
Lymphocytes Relative: 23 %
Lymphs Abs: 1.6 10*3/uL (ref 0.7–4.0)
MCH: 29.3 pg (ref 26.0–34.0)
MCHC: 32.9 g/dL (ref 30.0–36.0)
MCV: 88.8 fL (ref 80.0–100.0)
Monocytes Absolute: 0.8 10*3/uL (ref 0.1–1.0)
Monocytes Relative: 11 %
Neutro Abs: 4.4 10*3/uL (ref 1.7–7.7)
Neutrophils Relative %: 63 %
Platelets: 220 10*3/uL (ref 150–400)
RBC: 4.75 MIL/uL (ref 3.87–5.11)
RDW: 20.8 % — ABNORMAL HIGH (ref 11.5–15.5)
WBC: 7 10*3/uL (ref 4.0–10.5)
nRBC: 0 % (ref 0.0–0.2)

## 2020-08-03 LAB — IRON AND TIBC
Iron: 121 ug/dL (ref 28–170)
Saturation Ratios: 31 % (ref 10.4–31.8)
TIBC: 385 ug/dL (ref 250–450)
UIBC: 264 ug/dL

## 2020-08-03 LAB — FERRITIN: Ferritin: 36 ng/mL (ref 11–307)

## 2020-08-04 ENCOUNTER — Inpatient Hospital Stay: Payer: Medicare Other

## 2020-08-04 ENCOUNTER — Inpatient Hospital Stay: Payer: Medicare Other | Attending: Oncology | Admitting: Oncology

## 2020-08-04 ENCOUNTER — Encounter: Payer: Self-pay | Admitting: Oncology

## 2020-08-04 VITALS — BP 139/98 | HR 103 | Temp 98.5°F | Resp 18 | Wt 178.1 lb

## 2020-08-04 DIAGNOSIS — F17210 Nicotine dependence, cigarettes, uncomplicated: Secondary | ICD-10-CM | POA: Diagnosis not present

## 2020-08-04 DIAGNOSIS — C50812 Malignant neoplasm of overlapping sites of left female breast: Secondary | ICD-10-CM | POA: Insufficient documentation

## 2020-08-04 DIAGNOSIS — Z79899 Other long term (current) drug therapy: Secondary | ICD-10-CM | POA: Diagnosis not present

## 2020-08-04 DIAGNOSIS — Z171 Estrogen receptor negative status [ER-]: Secondary | ICD-10-CM | POA: Insufficient documentation

## 2020-08-04 NOTE — Progress Notes (Signed)
Hematology/Oncology Follow up note [facility] [phone] [phone]   Patient Care Team: Patient, No Pcp Per as PCP - General (General Practice) Patient, No Pcp Per (General Practice) Earlie Server, MD as Consulting Physician (Hematology and Oncology) REASON FOR VISIT Follow up for treatment of triple negative breast cancer and iron deficiency anemia.     HISTORY OF PRESENTING ILLNESS:  Cindy Brewer is a  50 y.o.  female with PMH listed below who was referred to me for evaluation of newly diagnosed breast cancer.  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 two separate foci of invasive mammary carcinoma with associated tumor necrosis, DCIS present, fibroadenoma 57m, Foci 1 is 437m and foci 2 is 3536mmargins are negative. Sentinel LN 0/0 involved. Negative LVI declined neoadjuvant or adjuvant chemotherapy  # Genetic testing: BRCA neg Testing did not reveal a pathogenic mutation in any of the genes analyzed.  #Iron deficiency anemia status post multiple IV iron infusions. # Uterus Fibroid disease;  underwent endometrial biopsy establish care with GYN Dr. JacGlennon Macd had D&C, polypectomy, myomectomy   # Thyroid nodule, previously ordered thyroid ultrasound not done. INTERVAL HISTORY SheKenidy Brewer a 49 30o. female who has above history reviewed by me today presents for follow up visit for Triple negative breast cancer and iron deficiency anemia  Menses stopped 3 months ago. Feel well, fatigue has improved.  She denies any breast concerns.   .  Review of Systems  Constitutional: Negative for appetite change, chills, fatigue and fever.  HENT:   Negative for hearing loss and voice change.   Eyes: Negative for eye problems.  Respiratory: Negative for chest tightness, cough and shortness of breath.    Cardiovascular: Negative for chest pain.  Gastrointestinal: Negative for abdominal distention, abdominal pain and blood in stool.  Endocrine: Negative for hot flashes.  Genitourinary: Negative for difficulty urinating and frequency.   Musculoskeletal: Negative for arthralgias.  Skin: Negative for itching and rash.  Neurological: Negative for extremity weakness.  Hematological: Negative for adenopathy.  Psychiatric/Behavioral: Negative for confusion.     MEDICAL HISTORY:  Past Medical History:  Diagnosis Date  . Anemia   . Anxiety   . Arthritis    right knee   . [place]   BREAST- Left  . Depression   . Fibroid   . GERD (gastroesophageal reflux disease)     SURGICAL HISTORY: Past Surgical History:  Procedure Laterality Date  . BREAST BIOPSY Left 2019   [facility]  . BREAST LUMPECTOMY Left 2019  . BREAST LUMPECTOMY WITH AXILLARY LYMPH NODE BIOPSY Left 11/24/2017   Procedure: LEFT BREAST LUMPECTOMY WITH LEFT SENTINEL NODE LYMPH NODE BIOPSY;  Surgeon: TotJovita KussmaulD;  Location: [facility] ORS;  Service: General;  Laterality: Left;  . CESAREAN SECTION    . EYE SURGERY     DETACHED RETINA 4/519  . HYSTEROSCOPY WITH D & C N/A 03/14/2019   Procedure: DILATATION AND CURETTAGE /HYPollyann GlenYOMECTOMY;  Surgeon: JacWill BonnetD;  Location: [facility] ORS;  Service: Gynecology;  Laterality: N/A;  . IR EMBO TUMOR ORGAN ISCHEMIA INFARCT INC GUIDE ROADMAPPING      SOCIAL HISTORY: Social History   Socioeconomic History  . Marital status: Divorced    Spouse name: Not on file  . Number of children: Not on file  . Years of education: Not on file  . Highest education level: Not  on file  Occupational History  . Not on file  Tobacco Use  . Smoking status: Current Every Day Smoker    Packs/day: 0.25    Years: 13.00    Pack years: 3.25    Types: Cigarettes  . Smokeless tobacco: Never Used  Vaping Use  . Vaping Use: Never used  Substance and Sexual Activity  . Alcohol use:  Yes    Comment: weekends  . Drug use: No  . Sexual activity: Yes    Birth control/protection: None  Other Topics Concern  . Not on file  Social History Narrative   ** Merged History Encounter **       Social Determinants of Health   Financial Resource Strain: Not on file  Food Insecurity: Not on file  Transportation Needs: Not on file  Physical Activity: Not on file  Stress: Not on file  Social Connections: Not on file  Intimate Partner Violence: Not on file    FAMILY HISTORY: Family History  Problem Relation Age of Onset  . Stroke Maternal Grandmother   . Breast cancer Neg Hx     ALLERGIES:  is allergic to demerol [meperidine], oxycodone, sulfa antibiotics, and percocet [oxycodone-acetaminophen].  MEDICATIONS:  Current Outpatient Medications  Medication Sig Dispense Refill  . acetaminophen-codeine (TYLENOL #3) 300-30 MG tablet Take 1 tablet by mouth every 6 (six) hours as needed for moderate pain.    . cimetidine (TAGAMET) 200 MG tablet Take 200 mg by mouth daily as needed (acid reflux).    . clindamycin (CLEOCIN) 300 MG capsule Take 300 mg by mouth 2 (two) times daily.    . diphenhydrAMINE (BENADRYL) 25 mg capsule Take 25 mg by mouth every 6 (six) hours as needed.    . Multiple Vitamin (MULTI-VITAMINS) TABS Take by mouth.    . ranitidine (ZANTAC) 75 MG tablet Take 75 mg by mouth daily as needed for heartburn.     No current facility-administered medications for this visit.   Facility-Administered Medications Ordered in Other Visits  Medication Dose Route Frequency Provider Last Rate Last Admin  . 0.9 %  sodium chloride infusion   Intravenous Continuous Earlie Server, MD 10 mL/hr at 01/11/19 1412 New Bag at 01/11/19 1412     PHYSICAL EXAMINATION: ECOG PERFORMANCE STATUS: 1 - Symptomatic but completely ambulatory Vitals:   08/04/20 1415  BP: (!) 139/98  Pulse: (!) 103  Resp: 18  Temp: 98.5 F (36.9 C)   Filed Weights   08/04/20 1415  Weight: 178 lb 1.6 oz (80.8  kg)    Physical Exam Constitutional:      General: She is not in acute distress.    Appearance: She is not diaphoretic.  HENT:     Head: Normocephalic and atraumatic.     Nose: Nose normal.     Mouth/Throat:     Pharynx: No oropharyngeal exudate.  Eyes:     General: No scleral icterus.       Left eye: No discharge.     Pupils: Pupils are equal, round, and reactive to light.  Neck:     Vascular: No JVD.  Cardiovascular:     Rate and Rhythm: Normal rate and regular rhythm.     Heart sounds: Normal heart sounds. No murmur heard.   Pulmonary:     Effort: Pulmonary effort is normal. No respiratory distress.     Breath sounds: Normal breath sounds. No wheezing or rales.  Chest:     Chest wall: No tenderness.  Abdominal:  General: Bowel sounds are normal. There is no distension.     Palpations: Abdomen is soft. There is no mass.     Tenderness: There is no abdominal tenderness. There is no rebound.  Musculoskeletal:        General: No tenderness. Normal range of motion.     Cervical back: Normal range of motion and neck supple.  Lymphadenopathy:     Cervical: No cervical adenopathy.  Skin:    General: Skin is warm and dry.     Coloration: Skin is pale.     Findings: No erythema or rash.  Neurological:     Mental Status: She is alert and oriented to person, place, and time.     Cranial Nerves: No cranial nerve deficit.     Motor: No abnormal muscle tone.     Coordination: Coordination normal.  Psychiatric:        Mood and Affect: Affect normal.        Cognition and Memory: Memory normal.        Judgment: Judgment normal.    Breast exam was performed in seated and lying down position. Patient is status post left lumpectomy with a well-healing surgical scar.no palpable breast masses or axillary lymphadenopathy bilaterally.       LABORATORY DATA:  I have reviewed the data as listed Lab Results  Component Value Date   WBC 7.0 08/03/2020   HGB 13.9 08/03/2020    HCT 42.2 08/03/2020   MCV 88.8 08/03/2020   PLT 220 08/03/2020   Recent Labs    05/08/20 1454  NA 137  K 4.0  CL 105  CO2 23  GLUCOSE 102*  BUN 10  CREATININE 0.74  CALCIUM 9.2  GFRNONAA >60  PROT 8.2*  ALBUMIN 4.4  AST 16  ALT 10  ALKPHOS 29*  BILITOT 0.4    ASSESSMENT & PLAN:  Cancer Staging Malignant neoplasm of overlapping sites of left breast in female, estrogen receptor negative ([place]) Staging form: Breast, AJCC 8th Edition - Clinical stage from 08/25/2017: Stage IIB (cT2(2), cN0, cM0, G3, ER: Negative, PR: Negative, HER2: Negative) - Signed by Earlie Server, MD on 08/25/2017  1. 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 High recurrence risk.  05/27/20 Bilateral mammogram showed no mammographic evidence of malignancy.  Check CA 15.3, CA 27.29 She plans to obtain surgical opinion at [facility] for reconstruction.  Recommend calcium and vitamin D supplementation.   # Iron deficiency anemia has resolved. Labs are reviewed and discussed with patient.  Return of visit: 6 months   Orders Placed This Encounter  Procedures  . Comprehensive metabolic panel    Standing Status:   Future    Number of Occurrences:   1    Standing Expiration Date:   08/04/2021  . Cancer antigen 15-3    Standing Status:   Future    Number of Occurrences:   1    Standing Expiration Date:   08/04/2021  . Cancer antigen 27.29    Standing Status:   Future    Number of Occurrences:   1    Standing Expiration Date:   08/04/2021   We spent sufficient time to discuss many aspect of care, questions were answered to patient's satisfaction.   Earlie Server, MD, PhD 08/04/2020

## 2020-08-04 NOTE — Progress Notes (Signed)
Pt here for follow up. No new concerns voiced. Pt reports she is scheduled to see [facility] surgeon Dr. Hardin Negus in April.

## 2020-08-05 LAB — COMPREHENSIVE METABOLIC PANEL
ALT: 16 U/L (ref 0–44)
AST: 26 U/L (ref 15–41)
Albumin: 4.9 g/dL (ref 3.5–5.0)
Alkaline Phosphatase: 32 U/L — ABNORMAL LOW (ref 38–126)
Anion gap: 15 (ref 5–15)
BUN: 16 mg/dL (ref 6–20)
CO2: 24 mmol/L (ref 22–32)
Calcium: 9.6 mg/dL (ref 8.9–10.3)
Chloride: 100 mmol/L (ref 98–111)
Creatinine, Ser: 0.88 mg/dL (ref 0.44–1.00)
GFR, Estimated: >60 mL/min (ref >60)
Glucose, Bld: 75 mg/dL (ref 70–99)
Potassium: 3.7 mmol/L (ref 3.5–5.1)
Sodium: 139 mmol/L (ref 135–145)
Total Bilirubin: 0.6 mg/dL (ref 0.3–1.2)
Total Protein: 8.4 g/dL — ABNORMAL HIGH (ref 6.5–8.1)

## 2020-08-05 LAB — CANCER ANTIGEN 27.29: CA 27.29: 18.4 U/mL (ref 0.0–38.6)

## 2020-08-05 LAB — CANCER ANTIGEN 15-3: CA 15-3: 18.7 U/mL (ref 0.0–25.0)

## 2021-01-29 ENCOUNTER — Other Ambulatory Visit: Payer: Self-pay

## 2021-01-29 DIAGNOSIS — Z171 Estrogen receptor negative status [ER-]: Secondary | ICD-10-CM

## 2021-01-29 DIAGNOSIS — C50812 Malignant neoplasm of overlapping sites of left female breast: Secondary | ICD-10-CM

## 2021-02-03 ENCOUNTER — Other Ambulatory Visit: Payer: Self-pay

## 2021-02-03 ENCOUNTER — Inpatient Hospital Stay: Payer: Medicare Other | Attending: Oncology

## 2021-02-03 DIAGNOSIS — C50812 Malignant neoplasm of overlapping sites of left female breast: Secondary | ICD-10-CM | POA: Diagnosis not present

## 2021-02-03 DIAGNOSIS — D7589 Other specified diseases of blood and blood-forming organs: Secondary | ICD-10-CM | POA: Diagnosis not present

## 2021-02-03 DIAGNOSIS — Z171 Estrogen receptor negative status [ER-]: Secondary | ICD-10-CM | POA: Insufficient documentation

## 2021-02-03 DIAGNOSIS — Z79899 Other long term (current) drug therapy: Secondary | ICD-10-CM | POA: Diagnosis not present

## 2021-02-03 LAB — COMPREHENSIVE METABOLIC PANEL
ALT: 23 U/L (ref 0–44)
AST: 29 U/L (ref 15–41)
Albumin: 4.3 g/dL (ref 3.5–5.0)
Alkaline Phosphatase: 43 U/L (ref 38–126)
Anion gap: 9 (ref 5–15)
BUN: 15 mg/dL (ref 6–20)
CO2: 25 mmol/L (ref 22–32)
Calcium: 9.1 mg/dL (ref 8.9–10.3)
Chloride: 101 mmol/L (ref 98–111)
Creatinine, Ser: 0.78 mg/dL (ref 0.44–1.00)
GFR, Estimated: >60 mL/min (ref >60)
Glucose, Bld: 152 mg/dL — ABNORMAL HIGH (ref 70–99)
Potassium: 3.8 mmol/L (ref 3.5–5.1)
Sodium: 135 mmol/L (ref 135–145)
Total Bilirubin: 0.5 mg/dL (ref 0.3–1.2)
Total Protein: 7.5 g/dL (ref 6.5–8.1)

## 2021-02-03 LAB — CBC WITH DIFFERENTIAL/PLATELET
Abs Immature Granulocytes: 0.02 10*3/uL (ref 0.00–0.07)
Basophils Absolute: 0 10*3/uL (ref 0.0–0.1)
Basophils Relative: 0 %
Eosinophils Absolute: 0.2 10*3/uL (ref 0.0–0.5)
Eosinophils Relative: 3 %
HCT: 40.7 % (ref 36.0–46.0)
Hemoglobin: 13.3 g/dL (ref 12.0–15.0)
Immature Granulocytes: 0 %
Lymphocytes Relative: 24 %
Lymphs Abs: 1.3 10*3/uL (ref 0.7–4.0)
MCH: 33.6 pg (ref 26.0–34.0)
MCHC: 32.7 g/dL (ref 30.0–36.0)
MCV: 102.8 fL — ABNORMAL HIGH (ref 80.0–100.0)
Monocytes Absolute: 0.4 10*3/uL (ref 0.1–1.0)
Monocytes Relative: 7 %
Neutro Abs: 3.4 10*3/uL (ref 1.7–7.7)
Neutrophils Relative %: 66 %
Platelets: 192 10*3/uL (ref 150–400)
RBC: 3.96 MIL/uL (ref 3.87–5.11)
RDW: 12.1 % (ref 11.5–15.5)
WBC: 5.2 10*3/uL (ref 4.0–10.5)
nRBC: 0 % (ref 0.0–0.2)

## 2021-02-04 LAB — CANCER ANTIGEN 15-3: CA 15-3: 15.2 U/mL (ref 0.0–25.0)

## 2021-02-04 LAB — CANCER ANTIGEN 27.29: CA 27.29: 12.2 U/mL (ref 0.0–38.6)

## 2021-02-05 ENCOUNTER — Encounter: Payer: Self-pay | Admitting: Oncology

## 2021-02-05 ENCOUNTER — Inpatient Hospital Stay: Payer: Medicare Other

## 2021-02-05 ENCOUNTER — Inpatient Hospital Stay: Payer: Medicare Other | Admitting: Oncology

## 2021-02-05 ENCOUNTER — Inpatient Hospital Stay (HOSPITAL_BASED_OUTPATIENT_CLINIC_OR_DEPARTMENT_OTHER): Payer: Medicare Other | Admitting: Oncology

## 2021-02-05 VITALS — BP 155/103 | HR 102 | Temp 98.0°F | Resp 18 | Wt 185.0 lb

## 2021-02-05 DIAGNOSIS — Z7289 Other problems related to lifestyle: Secondary | ICD-10-CM

## 2021-02-05 DIAGNOSIS — R739 Hyperglycemia, unspecified: Secondary | ICD-10-CM

## 2021-02-05 DIAGNOSIS — C50812 Malignant neoplasm of overlapping sites of left female breast: Secondary | ICD-10-CM

## 2021-02-05 DIAGNOSIS — D7589 Other specified diseases of blood and blood-forming organs: Secondary | ICD-10-CM

## 2021-02-05 DIAGNOSIS — R109 Unspecified abdominal pain: Secondary | ICD-10-CM

## 2021-02-05 DIAGNOSIS — Z789 Other specified health status: Secondary | ICD-10-CM

## 2021-02-05 DIAGNOSIS — Z171 Estrogen receptor negative status [ER-]: Secondary | ICD-10-CM

## 2021-02-05 LAB — URINALYSIS, COMPLETE (UACMP) WITH MICROSCOPIC
Bilirubin Urine: NEGATIVE
Glucose, UA: NEGATIVE mg/dL
Ketones, ur: 5 mg/dL — AB
Leukocytes,Ua: NEGATIVE
Nitrite: NEGATIVE
Protein, ur: 30 mg/dL — AB
Specific Gravity, Urine: 1.026 (ref 1.005–1.030)
pH: 5 (ref 5.0–8.0)

## 2021-02-05 LAB — VITAMIN B12: Vitamin B-12: 258 pg/mL (ref 180–914)

## 2021-02-05 LAB — HEMOGLOBIN A1C
Hgb A1c MFr Bld: 5 % (ref 4.8–5.6)
Mean Plasma Glucose: 96.8 mg/dL

## 2021-02-05 NOTE — Progress Notes (Signed)
Patient denies new problems/concerns today.   °

## 2021-02-05 NOTE — Progress Notes (Addendum)
Hematology/Oncology Follow up note [facility] [phone] [phone]   Patient Care Team: Patient, No Pcp Per (Inactive) as PCP - General (General Practice) Patient, No Pcp Per (Inactive) (General Practice) Earlie Server, MD as Consulting Physician (Hematology and Oncology) REASON FOR VISIT Follow up for treatment of triple negative breast cancer and iron deficiency anemia.     HISTORY OF PRESENTING ILLNESS:  Cindy Brewer is a  50 y.o.  female with PMH listed below who was referred to me for evaluation of newly diagnosed breast cancer.  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 two separate foci of invasive mammary carcinoma with associated tumor necrosis, DCIS present, fibroadenoma 24m, Foci 1 is 470m and foci 2 is 3550mmargins are negative. Sentinel LN 0/0 involved. Negative LVI declined neoadjuvant or adjuvant chemotherapy  # Genetic testing: BRCA neg Testing did not reveal a pathogenic mutation in any of the genes analyzed.  #Iron deficiency anemia status post multiple IV iron infusions. # Uterus Fibroid disease;  underwent endometrial biopsy establish care with GYN Dr. JacGlennon Macd had D&C, polypectomy, myomectomy   # Thyroid nodule, previously ordered thyroid ultrasound not done. INTERVAL HISTORY Cindy Brewer a 49 88o. female who has above history reviewed by me today presents for follow up visit for Triple negative breast cancer  Patient is postmenopausal.  Reports that she has weight gain, hot flash. She drinks 1-2 glasses of wine during weekdays, during the weekends, she sometimes drinks a bottle of vodka.  She plans to cut down alcohol consumption. She does not have any breast concerns. Reports intermittent dysuria, left flank discomfort.  Also she feels some back pain.   .  Review of Systems   Constitutional:  Negative for appetite change, chills, fatigue and fever.  HENT:   Negative for hearing loss and voice change.   Eyes:  Negative for eye problems.  Respiratory:  Negative for chest tightness, cough and shortness of breath.   Cardiovascular:  Negative for chest pain.  Gastrointestinal:  Negative for abdominal distention, abdominal pain and blood in stool.  Endocrine: Negative for hot flashes.  Genitourinary:  Positive for dysuria. Negative for difficulty urinating and frequency.   Musculoskeletal:  Negative for arthralgias.  Skin:  Negative for itching and rash.  Neurological:  Negative for extremity weakness.  Hematological:  Negative for adenopathy.  Psychiatric/Behavioral:  Negative for confusion.     MEDICAL HISTORY:  Past Medical History:  Diagnosis Date   Anemia    Anxiety    Arthritis    right knee    Cancer ([id number]   BREAST- Left   Depression    Fibroid    GERD (gastroesophageal reflux disease)     SURGICAL HISTORY: Past Surgical History:  Procedure Laterality Date   BREAST BIOPSY Left 2019   IDC   BREAST LUMPECTOMY Left 2019   BREAST LUMPECTOMY WITH AXILLARY LYMPH NODE BIOPSY Left 11/24/2017   Procedure: LEFT BREAST LUMPECTOMY WITH LEFT SENTINEL NODE LYMPH NODE BIOPSY;  Surgeon: TotJovita KussmaulD;  Location: [facility] ORS;  Service: General;  Laterality: Left;   CESAREAN SECTION     EYE SURGERY     DETACHED RETINA 4/519   HYSTEROSCOPY WITH D & C N/A 03/14/2019   Procedure: DILATATION AND CURETTAGE /HYHenrene HawkingSurgeon: JacWill BonnetD;  Location: [facility] ORS;  Service: Gynecology;  Laterality: N/A;   IR EMBO TUMOR  ORGAN ISCHEMIA INFARCT INC GUIDE ROADMAPPING      SOCIAL HISTORY: Social History   Socioeconomic History   Marital status: Divorced    Spouse name: Not on file   Number of children: Not on file   Years of education: Not on file   Highest education level: Not on file  Occupational History   Not on file  Tobacco  Use   Smoking status: Every Day    Packs/day: 0.25    Years: 13.00    Pack years: 3.25    Types: Cigarettes   Smokeless tobacco: Never  Vaping Use   Vaping Use: Never used  Substance and Sexual Activity   Alcohol use: Yes    Comment: weekends   Drug use: No   Sexual activity: Yes    Birth control/protection: None  Other Topics Concern   Not on file  Social History Narrative   ** Merged History Encounter **       Social Determinants of Health   Financial Resource Strain: Not on file  Food Insecurity: Not on file  Transportation Needs: Not on file  Physical Activity: Not on file  Stress: Not on file  Social Connections: Not on file  Intimate Partner Violence: Not on file    FAMILY HISTORY: Family History  Problem Relation Age of Onset   Stroke Maternal Grandmother    Breast cancer Neg Hx     ALLERGIES:  is allergic to demerol [meperidine], oxycodone, sulfa antibiotics, and percocet [oxycodone-acetaminophen].  MEDICATIONS:  Current Outpatient Medications  Medication Sig Dispense Refill   cimetidine (TAGAMET) 200 MG tablet Take 200 mg by mouth daily as needed (acid reflux).     Multiple Vitamin (MULTI-VITAMINS) TABS Take by mouth.     ranitidine (ZANTAC) 75 MG tablet Take 75 mg by mouth daily as needed for heartburn.     varenicline (CHANTIX) 0.5 MG tablet Take by mouth.     acetaminophen-codeine (TYLENOL #3) 300-30 MG tablet Take 1 tablet by mouth every 6 (six) hours as needed for moderate pain. (Patient not taking: Reported on 02/05/2021)     clindamycin (CLEOCIN) 300 MG capsule Take 300 mg by mouth 2 (two) times daily. (Patient not taking: Reported on 02/05/2021)     diphenhydrAMINE (BENADRYL) 25 mg capsule Take 25 mg by mouth every 6 (six) hours as needed. (Patient not taking: Reported on 02/05/2021)     No current facility-administered medications for this visit.   Facility-Administered Medications Ordered in Other Visits  Medication Dose Route Frequency Provider  Last Rate Last Admin   0.9 %  sodium chloride infusion   Intravenous Continuous Earlie Server, MD 10 mL/hr at 01/11/19 1412 New Bag at 01/11/19 1412     PHYSICAL EXAMINATION: ECOG PERFORMANCE STATUS: 1 - Symptomatic but completely ambulatory Vitals:   02/05/21 1137  BP: (!) 155/103  Pulse: (!) 102  Resp: 18  Temp: 98 F (36.7 C)   Filed Weights   02/05/21 1137  Weight: 185 lb (83.9 kg)    Physical Exam Constitutional:      General: She is not in acute distress.    Appearance: She is not diaphoretic.  HENT:     Head: Normocephalic and atraumatic.     Nose: Nose normal.     Mouth/Throat:     Pharynx: No oropharyngeal exudate.  Eyes:     General: No scleral icterus.       Left eye: No discharge.     Pupils: Pupils are equal, round, and reactive to  light.  Neck:     Vascular: No JVD.  Cardiovascular:     Rate and Rhythm: Normal rate and regular rhythm.     Heart sounds: Normal heart sounds. No murmur heard. Pulmonary:     Effort: Pulmonary effort is normal. No respiratory distress.     Breath sounds: Normal breath sounds. No wheezing or rales.  Chest:     Chest wall: No tenderness.  Abdominal:     General: Bowel sounds are normal. There is no distension.     Palpations: Abdomen is soft. There is no mass.     Tenderness: There is no abdominal tenderness. There is no rebound.  Musculoskeletal:        General: No tenderness. Normal range of motion.     Cervical back: Normal range of motion and neck supple.  Lymphadenopathy:     Cervical: No cervical adenopathy.  Skin:    General: Skin is warm and dry.     Coloration: Skin is not pale.     Findings: No erythema or rash.  Neurological:     Mental Status: She is alert and oriented to person, place, and time.     Cranial Nerves: No cranial nerve deficit.     Motor: No abnormal muscle tone.     Coordination: Coordination normal.  Psychiatric:        Mood and Affect: Affect normal.        Cognition and Memory: Memory  normal.        Judgment: Judgment normal.   Breast exam was performed in seated and lying down position. Patient is status post left lumpectomy with a well-healing surgical scar.no palpable breast masses or axillary lymphadenopathy bilaterally.       LABORATORY DATA:  I have reviewed the data as listed Lab Results  Component Value Date   WBC 5.2 02/03/2021   HGB 13.3 02/03/2021   HCT 40.7 02/03/2021   MCV 102.8 (H) 02/03/2021   PLT 192 02/03/2021   Recent Labs    05/08/20 1454 08/04/20 1502 02/03/21 1253  NA 137 139 135  K 4.0 3.7 3.8  CL 105 100 101  CO2 23 24 25   GLUCOSE 102* 75 152*  BUN 10 16 15   CREATININE 0.74 0.88 0.78  CALCIUM 9.2 9.6 9.1  GFRNONAA >60 >60 >60  PROT 8.2* 8.4* 7.5  ALBUMIN 4.4 4.9 4.3  AST 16 26 29   ALT 10 16 23   ALKPHOS 29* 32* 43  BILITOT 0.4 0.6 0.5     ASSESSMENT & PLAN:  Cancer Staging Malignant neoplasm of overlapping sites of left breast in female, estrogen receptor negative ([place]) Staging form: Breast, AJCC 8th Edition - Clinical stage from 08/25/2017: Stage IIB (cT2(2), cN0, cM0, G3, ER-, PR-, HER2-) - Signed by Earlie Server, MD on 08/25/2017  1. Hyperglycemia   2. Malignant neoplasm of overlapping sites of left breast in female, estrogen receptor negative ([place])   3. Macrocytosis without anemia   4. Left flank pain   5. Alcohol use    # 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 High recurrence risk.  05/27/20 Bilateral mammogram showed no mammographic evidence of malignancy.  Stable CA 15.3, CA 27.29 Recommend patient to continue calcium and vitamin D supplementation.  Labs are reviewed with her.  #Postmenopausal vasovagal symptoms.  Recommend patient to further discuss with gynecology.  #Dysuria, flank discomfort.  Check UA and urine culture. #Hypercalcemia, check A1c. #Alcohol use, discussed with her about alcohol  cessation.  Patient has mild macrocytosis.   Check vitamin B12 level.  Return of visit: December 2022   Orders Placed This Encounter  Procedures   Urine Culture    Standing Status:   Future    Number of Occurrences:   1    Standing Expiration Date:   02/05/2022   MM 3D SCREEN BREAST BILATERAL    Standing Status:   Future    Standing Expiration Date:   02/05/2022    Order Specific Question:   Reason for Exam (SYMPTOM  OR DIAGNOSIS REQUIRED)    Answer:   history of breast cancer    Order Specific Question:   Is the patient pregnant?    Answer:   No    Order Specific Question:   Preferred imaging location?    Answer:   [facility]   Hemoglobin A1c    Standing Status:   Future    Number of Occurrences:   1    Standing Expiration Date:   02/05/2022   Vitamin B12    Standing Status:   Future    Number of Occurrences:   1    Standing Expiration Date:   02/05/2022   CBC with Differential/Platelet    Standing Status:   Future    Standing Expiration Date:   02/05/2022   Comprehensive metabolic panel    Standing Status:   Future    Standing Expiration Date:   02/05/2022   Urinalysis, Complete w Microscopic    Standing Status:   Future    Number of Occurrences:   1    Standing Expiration Date:   02/05/2022   We spent sufficient time to discuss many aspect of care, questions were answered to patient's satisfaction.   Earlie Server, MD, PhD 02/05/2021

## 2021-02-06 LAB — URINE CULTURE: Culture: NO GROWTH

## 2021-02-17 ENCOUNTER — Other Ambulatory Visit: Payer: Self-pay

## 2021-02-17 ENCOUNTER — Observation Stay
Admission: EM | Admit: 2021-02-17 | Discharge: 2021-02-18 | Disposition: A | Discharge status: Home / Self Care | Payer: Medicare Other | Attending: Obstetrics and Gynecology | Admitting: Obstetrics and Gynecology

## 2021-02-17 ENCOUNTER — Encounter: Payer: Self-pay | Admitting: Emergency Medicine

## 2021-02-17 ENCOUNTER — Emergency Department: Payer: Medicare Other

## 2021-02-17 DIAGNOSIS — Z853 Personal history of malignant neoplasm of breast: Secondary | ICD-10-CM | POA: Insufficient documentation

## 2021-02-17 DIAGNOSIS — R079 Chest pain, unspecified: Secondary | ICD-10-CM | POA: Diagnosis present

## 2021-02-17 DIAGNOSIS — R61 Generalized hyperhidrosis: Secondary | ICD-10-CM | POA: Insufficient documentation

## 2021-02-17 DIAGNOSIS — R072 Precordial pain: Secondary | ICD-10-CM | POA: Diagnosis not present

## 2021-02-17 DIAGNOSIS — I200 Unstable angina: Secondary | ICD-10-CM | POA: Diagnosis not present

## 2021-02-17 DIAGNOSIS — Z20822 Contact with and (suspected) exposure to covid-19: Secondary | ICD-10-CM | POA: Insufficient documentation

## 2021-02-17 DIAGNOSIS — F17200 Nicotine dependence, unspecified, uncomplicated: Secondary | ICD-10-CM | POA: Diagnosis present

## 2021-02-17 DIAGNOSIS — F17210 Nicotine dependence, cigarettes, uncomplicated: Secondary | ICD-10-CM | POA: Diagnosis not present

## 2021-02-17 DIAGNOSIS — F17213 Nicotine dependence, cigarettes, with withdrawal: Secondary | ICD-10-CM | POA: Diagnosis not present

## 2021-02-17 LAB — CBC
HCT: 39.5 % (ref 36.0–46.0)
Hemoglobin: 13.6 g/dL (ref 12.0–15.0)
MCH: 34.8 pg — ABNORMAL HIGH (ref 26.0–34.0)
MCHC: 34.4 g/dL (ref 30.0–36.0)
MCV: 101 fL — ABNORMAL HIGH (ref 80.0–100.0)
Platelets: 224 10*3/uL (ref 150–400)
RBC: 3.91 MIL/uL (ref 3.87–5.11)
RDW: 12 % (ref 11.5–15.5)
WBC: 5.5 10*3/uL (ref 4.0–10.5)
nRBC: 0 % (ref 0.0–0.2)

## 2021-02-17 LAB — BASIC METABOLIC PANEL
Anion gap: 11 (ref 5–15)
BUN: 14 mg/dL (ref 6–20)
CO2: 24 mmol/L (ref 22–32)
Calcium: 9.5 mg/dL (ref 8.9–10.3)
Chloride: 103 mmol/L (ref 98–111)
Creatinine, Ser: 0.85 mg/dL (ref 0.44–1.00)
GFR, Estimated: >60 mL/min (ref >60)
Glucose, Bld: 107 mg/dL — ABNORMAL HIGH (ref 70–99)
Potassium: 3.7 mmol/L (ref 3.5–5.1)
Sodium: 138 mmol/L (ref 135–145)

## 2021-02-17 LAB — RESP PANEL BY RT-PCR (FLU A&B, COVID) ARPGX2
Influenza A by PCR: NEGATIVE
Influenza B by PCR: NEGATIVE
SARS Coronavirus 2 by RT PCR: NEGATIVE

## 2021-02-17 LAB — TROPONIN I (HIGH SENSITIVITY)
Troponin I (High Sensitivity): 20 ng/L — ABNORMAL HIGH (ref <18)
Troponin I (High Sensitivity): 50 ng/L — ABNORMAL HIGH (ref <18)
Troponin I (High Sensitivity): 56 ng/L — ABNORMAL HIGH (ref <18)

## 2021-02-17 LAB — LIPID PANEL
Cholesterol: 193 mg/dL (ref 0–200)
HDL: 27 mg/dL — ABNORMAL LOW (ref >40)
LDL Cholesterol: 128 mg/dL — ABNORMAL HIGH (ref 0–99)
Total CHOL/HDL Ratio: 7.1 RATIO
Triglycerides: 189 mg/dL — ABNORMAL HIGH (ref <150)
VLDL: 38 mg/dL (ref 0–40)

## 2021-02-17 MED ORDER — VARENICLINE TARTRATE 0.5 MG PO TABS
0.5000 mg | ORAL_TABLET | Freq: Every day | ORAL | Status: DC
  Administered 2021-02-17: 0.5 mg via ORAL
  Filled 2021-02-17 (×2): qty 1

## 2021-02-17 MED ORDER — ASPIRIN 81 MG PO CHEW
324.0000 mg | CHEWABLE_TABLET | Freq: Once | ORAL | Status: AC
  Administered 2021-02-17: 324 mg via ORAL
  Filled 2021-02-17: qty 4

## 2021-02-17 MED ORDER — ATORVASTATIN CALCIUM 20 MG PO TABS
20.0000 mg | ORAL_TABLET | Freq: Every day | ORAL | Status: DC
  Administered 2021-02-17: 20 mg via ORAL
  Filled 2021-02-17: qty 1

## 2021-02-17 MED ORDER — ONDANSETRON HCL 4 MG/2ML IJ SOLN: 4.0000 mg | Freq: Four times a day (QID) | INTRAMUSCULAR | Status: DC | PRN

## 2021-02-17 MED ORDER — ASPIRIN EC 81 MG PO TBEC: 81.0000 mg | DELAYED_RELEASE_TABLET | Freq: Every day | ORAL | Status: DC

## 2021-02-17 MED ORDER — ENOXAPARIN SODIUM 40 MG/0.4ML IJ SOSY
40.0000 mg | PREFILLED_SYRINGE | INTRAMUSCULAR | Status: DC
  Administered 2021-02-17: 40 mg via SUBCUTANEOUS
  Filled 2021-02-17: qty 0.4

## 2021-02-17 MED ORDER — ADULT MULTIVITAMIN W/MINERALS CH
1.0000 | ORAL_TABLET | Freq: Every day | ORAL | Status: DC
  Administered 2021-02-17: 1 via ORAL
  Filled 2021-02-17: qty 1

## 2021-02-17 MED ORDER — METOPROLOL TARTRATE 25 MG PO TABS
25.0000 mg | ORAL_TABLET | Freq: Two times a day (BID) | ORAL | Status: DC
  Administered 2021-02-17: 25 mg via ORAL
  Filled 2021-02-17: qty 1

## 2021-02-17 NOTE — ED Notes (Signed)
Labs drawn by lab.

## 2021-02-17 NOTE — ED Provider Notes (Signed)
[facility] Emergency Department Provider Note   ____________________________________________   Event Date/Time   First MD Initiated Contact with Patient 02/17/21 1459     (approximate)  I have reviewed the triage vital signs and the nursing notes.   HISTORY  Chief Complaint Chest Pain    HPI Cindy Brewer is a 50 y.o. female who presents for chest pain  LOCATION: Substernal chest DURATION: Began approximately 3 hours prior to arrival TIMING: Improved since onset SEVERITY: 2/10 now but has been up to 6/10 earlier today QUALITY: Chest pressure CONTEXT: Patient states that she just got home from working out and began having chest pressure substernally that gradually resolved MODIFYING FACTORS: Exertionally worsened and partially relieved at rest ASSOCIATED SYMPTOMS: Diaphoresis, shortness of breath   Per medical record review, patient does have history of left breast cancer in remission, GERD, and anxiety          Past Medical History:  Diagnosis Date   Anemia    Anxiety    Arthritis    right knee    Cancer ([place]) 2019   BREAST- Left   Depression    Fibroid    GERD (gastroesophageal reflux disease)     Patient Active Problem List   Diagnosis Date Noted   Chest pain 02/17/2021   Hyperglycemia 02/05/2021   Macrocytosis without anemia 02/05/2021   Left flank pain 02/05/2021   Fibroid uterus 03/08/2018   Menorrhagia with irregular cycle 03/08/2018   Iron deficiency anemia 02/28/2018   Malignant neoplasm of overlapping sites of left breast in female, estrogen receptor negative ([place]) 08/25/2017    Past Surgical History:  Procedure Laterality Date   BREAST BIOPSY Left 2019   IDC   BREAST LUMPECTOMY Left 2019   BREAST LUMPECTOMY WITH AXILLARY LYMPH NODE BIOPSY Left 11/24/2017   Procedure: LEFT BREAST LUMPECTOMY WITH LEFT SENTINEL NODE LYMPH NODE BIOPSY;  Surgeon: Jovita Kussmaul, MD;  Location: [facility] ORS;  Service: General;   Laterality: Left;   CESAREAN SECTION     EYE SURGERY     DETACHED RETINA 4/519   HYSTEROSCOPY WITH D & C N/A 03/14/2019   Procedure: DILATATION AND CURETTAGE [place], MYOMECTOMY;  Surgeon: Will Bonnet, MD;  Location: [facility] ORS;  Service: Gynecology;  Laterality: N/A;   IR EMBO TUMOR ORGAN ISCHEMIA INFARCT INC GUIDE ROADMAPPING      Prior to Admission medications   Medication Sig Start Date End Date Taking? Authorizing Provider  acetaminophen-codeine (TYLENOL #3) 300-30 MG tablet Take 1 tablet by mouth every 6 (six) hours as needed for moderate pain. Patient not taking: Reported on 02/05/2021    [provider]  cimetidine (TAGAMET) 200 MG tablet Take 200 mg by mouth daily as needed (acid reflux).    [provider]  clindamycin (CLEOCIN) 300 MG capsule Take 300 mg by mouth 2 (two) times daily. Patient not taking: Reported on 02/05/2021    [provider]  diphenhydrAMINE (BENADRYL) 25 mg capsule Take 25 mg by mouth every 6 (six) hours as needed. Patient not taking: Reported on 02/05/2021    [provider]  Multiple Vitamin (MULTI-VITAMINS) TABS Take by mouth.    [provider]  ranitidine (ZANTAC) 75 MG tablet Take 75 mg by mouth daily as needed for heartburn.    [provider]  varenicline (CHANTIX) 0.5 MG tablet Take by mouth. 01/16/21   [provider]    Allergies Demerol [meperidine], Oxycodone, Sulfa antibiotics, and Percocet [oxycodone-acetaminophen]  Family History  Problem Relation Age of Onset   Stroke Maternal Grandmother    Breast cancer Neg Hx     Social History Social History   Tobacco Use   Smoking status: Every Day    Packs/day: 0.25    Years: 13.00    Pack years: 3.25    Types: Cigarettes   Smokeless tobacco: Never  Vaping Use   Vaping Use: Never used  Substance Use Topics   Alcohol use: Yes    Comment: weekends   Drug use: No    Review of Systems Constitutional: No  fever/chills Eyes: No visual changes. ENT: No sore throat. Cardiovascular: Endorses chest pain. Respiratory: Endorses shortness of breath. Gastrointestinal: No abdominal pain.  No nausea, no vomiting.  No diarrhea. Genitourinary: Negative for dysuria. Musculoskeletal: Negative for acute arthralgias Skin: Negative for rash. Neurological: Negative for headaches, weakness/numbness/paresthesias in any extremity Psychiatric: Negative for suicidal ideation/homicidal ideation   ____________________________________________   PHYSICAL EXAM:  VITAL SIGNS: ED Triage Vitals  Enc Vitals Group     BP 02/17/21 1207 (!) 134/95     Pulse Rate 02/17/21 1207 88     Resp 02/17/21 1207 17     Temp 02/17/21 1207 98.1 F (36.7 C)     Temp Source 02/17/21 1207 Oral     SpO2 02/17/21 1207 97 %     Weight 02/17/21 1208 184 lb 15.5 oz (83.9 kg)     Height 02/17/21 1208 '5\' 6"'$  (1.676 m)     Head Circumference --      Peak Flow --      Pain Score 02/17/21 1208 2     Pain Loc --      Pain Edu? --      Excl. in [place]? --    Constitutional: Alert and oriented. Well appearing middle-aged overweight African-American female in no acute distress. Eyes: Conjunctivae are normal. PERRL. Head: Atraumatic. Nose: No congestion/rhinnorhea. Mouth/Throat: Mucous membranes are moist. Neck: No stridor Cardiovascular: Grossly normal heart sounds.  Good peripheral circulation. Respiratory: Normal respiratory effort.  No retractions. Gastrointestinal: Soft and nontender. No distention. Musculoskeletal: No obvious deformities Neurologic:  Normal speech and language. No gross focal neurologic deficits are appreciated. Skin:  Skin is warm and dry. No rash noted. Psychiatric: Mood and affect are normal. Speech and behavior are normal.  ____________________________________________   LABS (all labs ordered are listed, but only abnormal results are displayed)  Labs Reviewed  BASIC METABOLIC PANEL - Abnormal; Notable for  the following components:      Result Value   Glucose, Bld 107 (*)    All other components within normal limits  CBC - Abnormal; Notable for the following components:   MCV 101.0 (*)    MCH 34.8 (*)    All other components within normal limits  TROPONIN I (HIGH SENSITIVITY) - Abnormal; Notable for the following components:   Troponin I (High Sensitivity) 20 (*)    All other components within normal limits  TROPONIN I (HIGH SENSITIVITY) - Abnormal; Notable for the following components:   Troponin I (High Sensitivity) 50 (*)    All other components within normal limits  RESP PANEL BY RT-PCR (FLU A&B, COVID) ARPGX2  POC URINE PREG, ED   ____________________________________________  EKG  ED ECG REPORT I, Naaman Plummer, the attending physician, personally viewed and interpreted this ECG.  Date: 02/17/2021 EKG Time: 1202 Rate: 93 Rhythm: normal sinus rhythm QRS Axis: normal Intervals: normal ST/T Wave abnormalities: normal Narrative Interpretation: no evidence of acute ischemia  ____________________________________________  RADIOLOGY  ED MD interpretation: 2 view chest x-ray shows no evidence of acute abnormalities including no pneumonia, pneumothorax, or widened mediastinum  Official radiology report(s): DG Chest 2 View  Result Date: 02/17/2021 CLINICAL DATA:  Chest pain. EXAM: CHEST - 2 VIEW COMPARISON:  Chest x-ray dated May 08, 2020. FINDINGS: The heart size and mediastinal contours are within normal limits. Both lungs are clear. The visualized skeletal structures are unremarkable. IMPRESSION: No active cardiopulmonary disease. Electronically Signed   By: Titus Dubin M.D.   On: 02/17/2021 12:57    ____________________________________________   PROCEDURES  Procedure(s) performed (including Critical Care):  .1-3 Lead EKG Interpretation  Date/Time: 02/17/2021 3:57 PM Performed by: Naaman Plummer, MD Authorized by: Naaman Plummer, MD     Interpretation:  normal     ECG rate:  93   ECG rate assessment: normal     Rhythm: sinus rhythm     Ectopy: none     Conduction: normal     ____________________________________________   INITIAL IMPRESSION / ASSESSMENT AND PLAN / ED COURSE  As part of my medical decision making, I reviewed the following data within the electronic medical record, if available:  Nursing notes reviewed and incorporated, Labs reviewed, EKG interpreted, Old chart reviewed, Radiograph reviewed and Notes from prior ED visits reviewed and incorporated     Workup: ECG, CXR, CBC, BMP, Troponin Findings: ECG: No overt evidence of STEMI. No evidence of Brugadas sign, delta wave, epsilon wave, significantly prolonged QTc, or malignant arrhythmia HS Troponin: Negative x1 Other Labs unremarkable for emergent problems. CXR: Without PTX, PNA, or widened mediastinum Last Stress Test: Never Last Heart Catheterization: Never HEART Score: 4  Given History, Exam, and Workup I have low suspicion for ACS, Pneumothorax, Pneumonia, Pulmonary Embolus, Tamponade, Aortic Dissection or other emergent problem as a cause for this presentation.   High Risk Chest Pain Patient at increased risk for Major Adverse Cardiac Event (AMI, PCI, CABG, death) Interventions: ASA '324mg'$  Defer Heparin drip as patient pain free at this time,   Disposition: Admit for continued cardiac monitoring and trending of troponins as well as further evaluation for potential inpatient stress testing vs cardiac catheterization and coronary angiography.        ____________________________________________   FINAL CLINICAL IMPRESSION(S) / ED DIAGNOSES  Final diagnoses:  Unstable angina ([place])  Diaphoresis     ED Discharge Orders     None        Note:  This document was prepared using Dragon voice recognition software and may include unintentional dictation errors.    Naaman Plummer, MD 02/17/21 [phone]

## 2021-02-17 NOTE — ED Notes (Signed)
meds given.  Pt alert.  Pt in hallway bed.

## 2021-02-17 NOTE — ED Notes (Signed)
hospitalist with pt in hallway

## 2021-02-17 NOTE — Consult Note (Signed)
[facility] Consultation Note  Patient ID: Cindy Brewer, MRN: [id number], DOB/AGE: 1971-02-13 50 y.o. Admit date: 02/17/2021   Date of Consult: 02/17/2021 Primary Physician: Patient, No Pcp Per (Inactive) Primary Cardiologist: None  Chief Complaint:  Chief Complaint  Patient presents with   Chest Pain   Reason for Consult:  Chest pain  HPI: 50 y.o. female with no evidence of family history of cardiovascular disease and no significant risk factors of cardiovascular disease.  No evidence of hypertension hyperlipidemia diabetes or tobacco abuse.  The patient has come in today with episode of chest discomfort.  Patient was working out doing very well and burning lots of calories under workout and felt great.  She then went to the grocery store and later had substernal chest discomfort and palpitations.  This lasted for several hours and she was calling 911.  At that time there was no resolution of the symptoms but when she arrived to the emergency room they fully resolved.  She claims that aspirin did help some of this.  In addition to that the patient has had an EKG showing normal sinus rhythm with nonspecific ST changes.  Troponin has been 20/50 with no evidence of acute coronary syndrome.  There is been no evidence of chest discomfort since her arrival to the emergency room and she has no heart failure type symptoms.  Currently she is hemodynamically stable  Past Medical History:  Diagnosis Date   Anemia    Anxiety    Arthritis    right knee    Cancer ([place]) 2019   BREAST- Left   Depression    Fibroid    GERD (gastroesophageal reflux disease)       Surgical History:  Past Surgical History:  Procedure Laterality Date   BREAST BIOPSY Left 2019   IDC   BREAST LUMPECTOMY Left 2019   BREAST LUMPECTOMY WITH AXILLARY LYMPH NODE BIOPSY Left 11/24/2017   Procedure: LEFT BREAST LUMPECTOMY WITH LEFT SENTINEL NODE LYMPH NODE BIOPSY;  Surgeon: Jovita Kussmaul, MD;   Location: [facility] ORS;  Service: General;  Laterality: Left;   CESAREAN SECTION     EYE SURGERY     DETACHED RETINA 4/519   HYSTEROSCOPY WITH D & C N/A 03/14/2019   Procedure: DILATATION AND CURETTAGE [name];  Surgeon: Will Bonnet, MD;  Location: [facility] ORS;  Service: Gynecology;  Laterality: N/A;   IR EMBO TUMOR ORGAN ISCHEMIA INFARCT INC GUIDE ROADMAPPING       Home Meds: Prior to Admission medications   Medication Sig Start Date End Date Taking? Authorizing Provider  acetaminophen-codeine (TYLENOL #3) 300-30 MG tablet Take 1 tablet by mouth every 6 (six) hours as needed for moderate pain. Patient not taking: Reported on 02/05/2021    [provider]  cimetidine (TAGAMET) 200 MG tablet Take 200 mg by mouth daily as needed (acid reflux).    [provider]  clindamycin (CLEOCIN) 300 MG capsule Take 300 mg by mouth 2 (two) times daily. Patient not taking: Reported on 02/05/2021    [provider]  diphenhydrAMINE (BENADRYL) 25 mg capsule Take 25 mg by mouth every 6 (six) hours as needed. Patient not taking: Reported on 02/05/2021    [provider]  Multiple Vitamin (MULTI-VITAMINS) TABS Take by mouth.    [provider]  ranitidine (ZANTAC) 75 MG tablet Take 75 mg by mouth daily as needed for heartburn.    [provider]  varenicline (CHANTIX) 0.5 MG tablet Take by mouth. 01/16/21  [provider]    Inpatient Medications:   aspirin EC  81 mg Oral Daily   atorvastatin  20 mg Oral Daily   enoxaparin (LOVENOX) injection  40 mg Subcutaneous Q24H   metoprolol tartrate  25 mg Oral BID   multivitamin with minerals  1 tablet Oral Daily   varenicline  0.5 mg Oral Daily     Allergies:  Allergies  Allergen Reactions   Demerol [Meperidine] Anaphylaxis    Pt does not tolerate pain killers.   Oxycodone Anaphylaxis   Sulfa Antibiotics Anaphylaxis   Percocet [Oxycodone-Acetaminophen] Swelling    Social History    Socioeconomic History   Marital status: Divorced    Spouse name: Not on file   Number of children: Not on file   Years of education: Not on file   Highest education level: Not on file  Occupational History   Not on file  Tobacco Use   Smoking status: Every Day    Packs/day: 0.25    Years: 13.00    Pack years: 3.25    Types: Cigarettes   Smokeless tobacco: Never  Vaping Use   Vaping Use: Never used  Substance and Sexual Activity   Alcohol use: Yes    Comment: weekends   Drug use: No   Sexual activity: Yes    Birth control/protection: None  Other Topics Concern   Not on file  Social History Narrative   ** Merged History Encounter **       Social Determinants of Health   Financial Resource Strain: Not on file  Food Insecurity: Not on file  Transportation Needs: Not on file  Physical Activity: Not on file  Stress: Not on file  Social Connections: Not on file  Intimate Partner Violence: Not on file     Family History  Problem Relation Age of Onset   Stroke Maternal Grandmother    Breast cancer Neg Hx      Review of Systems Positive for chest pain Negative for: General:  chills, fever, night sweats or weight changes.  Cardiovascular: PND orthopnea syncope dizziness  Dermatological skin lesions rashes Respiratory: Cough congestion Urologic: Frequent urination urination at night and hematuria Abdominal: negative for nausea, vomiting, diarrhea, bright red blood per rectum, melena, or hematemesis Neurologic: negative for visual changes, and/or hearing changes  All other systems reviewed and are otherwise negative except as noted above.  Labs: No results for input(s): CKTOTAL, CKMB, TROPONINI in the last 72 hours. Lab Results  Component Value Date   WBC 5.5 02/17/2021   HGB 13.6 02/17/2021   HCT 39.5 02/17/2021   MCV 101.0 (H) 02/17/2021   PLT 224 02/17/2021    Recent Labs  Lab 02/17/21 1210  NA 138  K 3.7  CL 103  CO2 24  BUN 14  CREATININE 0.85   CALCIUM 9.5  GLUCOSE 107*   No results found for: CHOL, HDL, LDLCALC, TRIG No results found for: DDIMER  Radiology/Studies:  DG Chest 2 View  Result Date: 02/17/2021 CLINICAL DATA:  Chest pain. EXAM: CHEST - 2 VIEW COMPARISON:  Chest x-ray dated May 08, 2020. FINDINGS: The heart size and mediastinal contours are within normal limits. Both lungs are clear. The visualized skeletal structures are unremarkable. IMPRESSION: No active cardiopulmonary disease. Electronically Signed   By: Titus Dubin M.D.   On: 02/17/2021 12:57    EKG: Normal sinus rhythm with nonspecific ST changes  Weights: Filed Weights   02/17/21 1208  Weight: 83.9 kg     Physical  Exam: Blood pressure 132/89, pulse 95, temperature 98.1 F (36.7 C), temperature source Oral, resp. rate 18, height '5\' 6"'$  (1.676 m), weight 83.9 kg, SpO2 98 %. Body mass index is 29.85 kg/m. General: Well developed, well nourished, in no acute distress. Head eyes ears nose throat: Normocephalic, atraumatic, sclera non-icteric, no xanthomas, nares are without discharge. No apparent thyromegaly and/or mass  Lungs: Normal respiratory effort.  no wheezes, no rales, no rhonchi.  Heart: RRR with normal S1 S2. no murmur gallop, no rub, PMI is normal size and placement, carotid upstroke normal without bruit, jugular venous pressure is normal Abdomen: Soft, non-tender, non-distended with normoactive bowel sounds. No hepatomegaly. No rebound/guarding. No obvious abdominal masses. Abdominal aorta is normal size without bruit Extremities: No edema. no cyanosis, no clubbing, no ulcers  Peripheral : 2+ bilateral upper extremity pulses, 2+ bilateral femoral pulses, 2+ bilateral dorsal pedal pulse Neuro: Alert and oriented. No facial asymmetry. No focal deficit. Moves all extremities spontaneously. Musculoskeletal: Normal muscle tone without kyphosis Psych:  Responds to questions appropriately with a normal affect.    Assessment: 50 year old  female with no evidence of cardiovascular risk factors or family history of cardiovascular disease having atypical chest discomfort with no current evidence of congestive heart failure or acute coronary syndrome or non-ST elevation myocardial infarction  Plan: 1.  Okay for observation for the next several hours for any recurrence of chest discomfort 2.  Begin ambulation and follow-up for improvements of symptoms and if improved with no evidence of significant new EKG changes or troponin elevation would be okay for discharge home from a cardiac standpoint with follow-up in 1 week for further assessment and treatment 3.  Call if further questions or need for further intervention as per above  Signed, Corey Skains M.D. [facility] 02/17/2021, 5:00 PM

## 2021-02-17 NOTE — ED Notes (Signed)
Pt on cell phone in hallway bed

## 2021-02-17 NOTE — ED Notes (Signed)
Pt eating [employer]. 

## 2021-02-17 NOTE — ED Notes (Signed)
Pt in hallway bed, pt alert.  Pt waiting on admission bed  Family with pt.

## 2021-02-17 NOTE — ED Notes (Signed)
Pt up to bathroom.

## 2021-02-17 NOTE — H&P (Signed)
History and Physical    Cindy Brewer [id number] DOB: Feb 10, 1971 DOA: 02/17/2021  PCP: Patient, No Pcp Per (Inactive)   Patient coming from: Home  I have personally briefly reviewed patient's old medical records in [place]  Chief Complaint: Chest pain  HPI: Cindy Brewer is a 50 y.o. female with medical history significant for triple negative breast cancer status postlumpectomy and lymph node dissection, anxiety, depression, GERD and nicotine dependence who presents to the ER for evaluation of chest pain which started a couple of hours prior to presenting to the ER. Patient states that she was in her usual state of health and had run some elements which included going to the gym, going for blood work and grocery shopping.  When she got home she was trying to make breakfast when she suddenly developed central chest pain which she describes as feeling like somebody was standing on her chest associated with dizziness, palpitations and diaphoresis.  She denies having any shortness of breath, no nausea, no vomiting. She has never had pain like this before. She rated her pain 10 x 10 in intensity at its worst but states that pain is now about 2 by 10 in intensity.  She received aspirin 324 mg in the emergency room. Patient does not have a known family history of coronary artery disease She denies having any cough, no fever, no chills, no abdominal pain, no changes in her bowel habits, no urinary symptoms, no leg swelling, no blurred vision, no focal deficits. Labs show sodium 138, potassium 3.7, chloride 93, bicarb 24, glucose 107, BUN 14, creatinine 0.85, calcium 9.5, troponin 20 >> 50, white count 5.5, hemoglobin 13.6, hematocrit 39.5, MCV 101, RDW 12.0, platelet count 224 Respiratory viral panel is negative Chest x-ray reviewed by me shows no evidence of active cardiopulmonary disease. Twelve-lead EKG reviewed by me shows normal sinus rhythm age-undetermined septal  infarct.   ED Course: Patient is a 50 year old female who presents to the ER for evaluation of chest pain that started at rest associated with diaphoresis and palpitations. She will be referred to observation status for further evaluation   Review of Systems: As per HPI otherwise all other systems reviewed and negative.    Past Medical History:  Diagnosis Date   Anemia    Anxiety    Arthritis    right knee    Cancer ([place]) 2019   BREAST- Left   Depression    Fibroid    GERD (gastroesophageal reflux disease)     Past Surgical History:  Procedure Laterality Date   BREAST BIOPSY Left 2019   IDC   BREAST LUMPECTOMY Left 2019   BREAST LUMPECTOMY WITH AXILLARY LYMPH NODE BIOPSY Left 11/24/2017   Procedure: LEFT BREAST LUMPECTOMY WITH LEFT SENTINEL NODE LYMPH NODE BIOPSY;  Surgeon: Jovita Kussmaul, MD;  Location: [facility] ORS;  Service: General;  Laterality: Left;   CESAREAN SECTION     EYE SURGERY     DETACHED RETINA 4/519   HYSTEROSCOPY WITH D & C N/A 03/14/2019   Procedure: DILATATION AND CURETTAGE [name];  Surgeon: Will Bonnet, MD;  Location: [facility] ORS;  Service: Gynecology;  Laterality: N/A;   IR EMBO TUMOR ORGAN ISCHEMIA INFARCT INC GUIDE ROADMAPPING       reports that she has been smoking cigarettes. She has a 3.25 pack-year smoking history. She has never used smokeless tobacco. She reports current alcohol use. She reports that she does not use drugs.  Allergies  Allergen Reactions   Demerol [  Meperidine] Anaphylaxis    Pt does not tolerate pain killers.   Oxycodone Anaphylaxis   Sulfa Antibiotics Anaphylaxis   Percocet [Oxycodone-Acetaminophen] Swelling    Family History  Problem Relation Age of Onset   Stroke Maternal Grandmother    Breast cancer Neg Hx       Prior to Admission medications   Medication Sig Start Date End Date Taking? Authorizing Provider  acetaminophen-codeine (TYLENOL #3) 300-30 MG tablet Take 1 tablet by mouth every 6  (six) hours as needed for moderate pain. Patient not taking: Reported on 02/05/2021    [provider]  cimetidine (TAGAMET) 200 MG tablet Take 200 mg by mouth daily as needed (acid reflux).    [provider]  clindamycin (CLEOCIN) 300 MG capsule Take 300 mg by mouth 2 (two) times daily. Patient not taking: Reported on 02/05/2021    [provider]  diphenhydrAMINE (BENADRYL) 25 mg capsule Take 25 mg by mouth every 6 (six) hours as needed. Patient not taking: Reported on 02/05/2021    [provider]  Multiple Vitamin (MULTI-VITAMINS) TABS Take by mouth.    [provider]  ranitidine (ZANTAC) 75 MG tablet Take 75 mg by mouth daily as needed for heartburn.    [provider]  varenicline (CHANTIX) 0.5 MG tablet Take by mouth. 01/16/21   [provider]    Physical Exam: Vitals:   02/17/21 1207 02/17/21 1208 02/17/21 1419  BP: (!) 134/95  132/89  Pulse: 88  95  Resp: 17  18  Temp: 98.1 F (36.7 C)    TempSrc: Oral    SpO2: 97%  98%  Weight:  83.9 kg   Height:  '5\' 6"'$  (1.676 m)      Vitals:   02/17/21 1207 02/17/21 1208 02/17/21 1419  BP: (!) 134/95  132/89  Pulse: 88  95  Resp: 17  18  Temp: 98.1 F (36.7 C)    TempSrc: Oral    SpO2: 97%  98%  Weight:  83.9 kg   Height:  '5\' 6"'$  (1.676 m)       Constitutional: Alert and oriented x 3 . Not in any apparent distress HEENT:      Head: Normocephalic and atraumatic.         Eyes: PERLA, EOMI, Conjunctivae are normal. Sclera is non-icteric.       Mouth/Throat: Mucous membranes are moist.       Neck: Supple with no signs of meningismus. Cardiovascular: Regular rate and rhythm. No murmurs, gallops, or rubs. 2+ symmetrical distal pulses are present . No JVD. No LE edema.  Chest pain is nonreproducible. Respiratory: Respiratory effort normal .Lungs sounds clear bilaterally. No wheezes, crackles, or rhonchi.  Gastrointestinal: Soft, non tender, and non distended with positive  bowel sounds.  Genitourinary: No CVA tenderness. Musculoskeletal: Nontender with normal range of motion in all extremities. No cyanosis, or erythema of extremities. Neurologic:  Face is symmetric. Moving all extremities. No gross focal neurologic deficits . Skin: Skin is warm, dry.  No rash or ulcers Psychiatric: Mood and affect are normal    Labs on Admission: I have personally reviewed following labs and imaging studies  CBC: Recent Labs  Lab 02/17/21 1210  WBC 5.5  HGB 13.6  HCT 39.5  MCV 101.0*  PLT [id number]   Basic Metabolic Panel: Recent Labs  Lab 02/17/21 1210  NA 138  K 3.7  CL 103  CO2 24  GLUCOSE 107*  BUN 14  CREATININE 0.85  CALCIUM 9.5  GFR: Estimated Creatinine Clearance: 88.3 mL/min (by C-G formula based on SCr of 0.85 mg/dL). Liver Function Tests: No results for input(s): AST, ALT, ALKPHOS, BILITOT, PROT, ALBUMIN in the last 168 hours. No results for input(s): LIPASE, AMYLASE in the last 168 hours. No results for input(s): AMMONIA in the last 168 hours. Coagulation Profile: No results for input(s): INR, PROTIME in the last 168 hours. Cardiac Enzymes: No results for input(s): CKTOTAL, CKMB, CKMBINDEX, TROPONINI in the last 168 hours. BNP (last 3 results) No results for input(s): PROBNP in the last 8760 hours. HbA1C: No results for input(s): HGBA1C in the last 72 hours. CBG: No results for input(s): GLUCAP in the last 168 hours. Lipid Profile: No results for input(s): CHOL, HDL, LDLCALC, TRIG, CHOLHDL, LDLDIRECT in the last 72 hours. Thyroid Function Tests: No results for input(s): TSH, T4TOTAL, FREET4, T3FREE, THYROIDAB in the last 72 hours. Anemia Panel: No results for input(s): VITAMINB12, FOLATE, FERRITIN, TIBC, IRON, RETICCTPCT in the last 72 hours. Urine analysis:    Component Value Date/Time   COLORURINE YELLOW (A) 02/05/2021 1213   APPEARANCEUR HAZY (A) 02/05/2021 1213   LABSPEC 1.026 02/05/2021 1213   PHURINE 5.0 02/05/2021 1213    GLUCOSEU NEGATIVE 02/05/2021 1213   HGBUR MODERATE (A) 02/05/2021 1213   BILIRUBINUR NEGATIVE 02/05/2021 1213   KETONESUR 5 (A) 02/05/2021 1213   PROTEINUR 30 (A) 02/05/2021 1213   UROBILINOGEN 1.0 09/09/2014 1505   NITRITE NEGATIVE 02/05/2021 1213   LEUKOCYTESUR NEGATIVE 02/05/2021 1213    Radiological Exams on Admission: DG Chest 2 View  Result Date: 02/17/2021 CLINICAL DATA:  Chest pain. EXAM: CHEST - 2 VIEW COMPARISON:  Chest x-ray dated May 08, 2020. FINDINGS: The heart size and mediastinal contours are within normal limits. Both lungs are clear. The visualized skeletal structures are unremarkable. IMPRESSION: No active cardiopulmonary disease. Electronically Signed   By: Titus Dubin M.D.   On: 02/17/2021 12:57     Assessment/Plan Principal Problem:   Chest pain Active Problems:   Nicotine dependence     Chest pain Not in acute coronary syndrome Cycle cardiac enzymes Obtain 2D echocardiogram to assess LVEF and rule out regional wall motion abnormality Place patient on aspirin, beta-blockers and statins Patient will most likely benefit from a stress test for further risk factor stratification Cardiology consult    Nicotine dependence Smoking cessation has been discussed with patient in detail Continue Chantix   DVT prophylaxis: Lovenox  Code Status: full code  Family Communication: Greater than 50% of time was spent discussing patient's condition and plan of care with patient at the bedside.  All questions and concerns have been addressed.  She verbalizes understanding and agrees to the plan. Disposition Plan: Back to previous home environment Consults called: Cardiology Status: Observation    Leather Estis MD [facility]     02/17/2021, 4:20 PM

## 2021-02-17 NOTE — ED Notes (Signed)
Resumed care from dee rn  Pt alert    Family with pt    Pt in hallway   meds given.

## 2021-02-17 NOTE — ED Triage Notes (Signed)
Pt comes into the ED via POV c/o central chest pain with no radiation.  Pt  denies any nausea or SHOB but states she did have some dizziness.  PT states she did have blood work completed this morning and then went to the gym and then the chest pain started.  PT currently in NAD with even and unlabored respirations.

## 2021-02-18 DIAGNOSIS — R072 Precordial pain: Secondary | ICD-10-CM | POA: Diagnosis not present

## 2021-02-18 LAB — HIV ANTIBODY (ROUTINE TESTING W REFLEX): HIV Screen 4th Generation wRfx: NONREACTIVE

## 2021-02-18 LAB — TROPONIN I (HIGH SENSITIVITY): Troponin I (High Sensitivity): 25 ng/L — ABNORMAL HIGH (ref <18)

## 2021-02-18 NOTE — Progress Notes (Signed)
IV removed from patient. Discharge instructions given to patient. Verbalized understanding. No acute distress at this time. Patient awaiting transportation home.

## 2021-02-18 NOTE — Discharge Summary (Signed)
Cindy Brewer [id number] DOB: 1970/11/22 DOA: 02/17/2021  PCP: Patient, No Pcp Per (Inactive)  Admit date: 02/17/2021 Discharge date: 02/18/2021  Time spent: 35 minutes  Recommendations for Outpatient Follow-up:  Cardiology f/u 1 week     Discharge Diagnoses:  Principal Problem:   Chest pain Active Problems:   Nicotine dependence   Discharge Condition: stable  Diet recommendation: regular  Filed Weights   02/17/21 1208  Weight: 83.9 kg    History of present illness:   Cindy Brewer is a 50 y.o. female with medical history significant for triple negative breast cancer status postlumpectomy and lymph node dissection, anxiety, depression, GERD and nicotine dependence who presents to the ER for evaluation of chest pain which started a couple of hours prior to presenting to the ER. Patient states that she was in her usual state of health and had run some elements which included going to the gym, going for blood work and grocery shopping.  When she got home she was trying to make breakfast when she suddenly developed central chest pain which she describes as feeling like somebody was standing on her chest associated with dizziness, palpitations and diaphoresis.  She denies having any shortness of breath, no nausea, no vomiting. She has never had pain like this before. She rated her pain 10 x 10 in intensity at its worst but states that pain is now about 2 by 10 in intensity.  She received aspirin 324 mg in the emergency room. Patient does not have a known family history of coronary artery disease  Hospital Course:  Patient presented with non-exertional atypical chest pain. Troponin did rise from 20 to 50 then back to 25 on hospital day 1. EKG w/o ischemic changes. Was observed overnight w/o recurrence of chest pain. Seen by cardiology, advised no further inpatient w/u, with outpatient f/u in 1 week.  Procedures: none   Consultations: cardiology  Discharge  Exam: Vitals:   02/18/21 1007 02/18/21 1008  BP: (!) 137/95   Pulse:    Resp: (!) 27 19  Temp: 98.4 F (36.9 C)   SpO2:      General: NAD Cardiovascular: RRR, no murmur Respiratory: CTAB  Discharge Instructions   Discharge Instructions     Diet general   Complete by: As directed    Increase activity slowly   Complete by: As directed       Allergies as of 02/18/2021       Reactions   Demerol [meperidine] Anaphylaxis   Pt does not tolerate pain killers.   Oxycodone Anaphylaxis   Sulfa Antibiotics Anaphylaxis   Percocet [oxycodone-acetaminophen] Swelling        Medication List     TAKE these medications    varenicline 0.5 MG tablet Commonly known as: CHANTIX Take by mouth.       Allergies  Allergen Reactions   Demerol [Meperidine] Anaphylaxis    Pt does not tolerate pain killers.   Oxycodone Anaphylaxis   Sulfa Antibiotics Anaphylaxis   Percocet [Oxycodone-Acetaminophen] Swelling    Follow-up Information     Corey Skains, MD. Schedule an appointment as soon as possible for a visit.   Specialty: Cardiology Contact information: [address] [facility] [place] [address] [phone]                  The results of significant diagnostics from this hospitalization (including imaging, microbiology, ancillary and laboratory) are listed below for reference.    Significant Diagnostic Studies: DG Chest 2 View  Result Date: 02/17/2021 CLINICAL DATA:  Chest pain. EXAM: CHEST - 2 VIEW COMPARISON:  Chest x-ray dated May 08, 2020. FINDINGS: The heart size and mediastinal contours are within normal limits. Both lungs are clear. The visualized skeletal structures are unremarkable. IMPRESSION: No active cardiopulmonary disease. Electronically Signed   By: Titus Dubin M.D.   On: 02/17/2021 12:57    Microbiology: Recent Results (from the past 240 hour(s))  Resp Panel by RT-PCR (Flu A&B, Covid)  Nasopharyngeal Swab     Status: None   Collection Time: 02/17/21  3:30 PM   Specimen: Nasopharyngeal Swab; Nasopharyngeal(NP) swabs in vial transport medium  Result Value Ref Range Status   SARS Coronavirus 2 by RT PCR NEGATIVE NEGATIVE Final    Comment: (NOTE) SARS-CoV-2 target nucleic acids are NOT DETECTED.  The SARS-CoV-2 RNA is generally detectable in upper respiratory specimens during the acute phase of infection. The lowest concentration of SARS-CoV-2 viral copies this assay can detect is 138 copies/mL. A negative result does not preclude SARS-Cov-2 infection and should not be used as the sole basis for treatment or other patient management decisions. A negative result may occur with  improper specimen collection/handling, submission of specimen other than nasopharyngeal swab, presence of viral mutation(s) within the areas targeted by this assay, and inadequate number of viral copies(<138 copies/mL). A negative result must be combined with clinical observations, patient history, and epidemiological information. The expected result is Negative.  Fact Sheet for Patients:  EntrepreneurPulse.com.au  Fact Sheet for Healthcare Providers:  IncredibleEmployment.be  This test is no t yet approved or cleared by the [place] FDA and  has been authorized for detection and/or diagnosis of SARS-CoV-2 by FDA under an Emergency Use Authorization (EUA). This EUA will remain  in effect (meaning this test can be used) for the duration of the COVID-19 declaration under Section 564(b)(1) of the Act, 21 U.S.C.section 360bbb-3(b)(1), unless the authorization is terminated  or revoked sooner.       Influenza A by PCR NEGATIVE NEGATIVE Final   Influenza B by PCR NEGATIVE NEGATIVE Final    Comment: (NOTE) The Xpert Xpress SARS-CoV-2/FLU/RSV plus assay is intended as an aid in the diagnosis of influenza from Nasopharyngeal swab specimens and should not be  used as a sole basis for treatment. Nasal washings and aspirates are unacceptable for Xpert Xpress SARS-CoV-2/FLU/RSV testing.  Fact Sheet for Patients: EntrepreneurPulse.com.au  Fact Sheet for Healthcare Providers: IncredibleEmployment.be  This test is not yet approved or cleared by the [place] FDA and has been authorized for detection and/or diagnosis of SARS-CoV-2 by FDA under an Emergency Use Authorization (EUA). This EUA will remain in effect (meaning this test can be used) for the duration of the COVID-19 declaration under Section 564(b)(1) of the Act, 21 U.S.C. section 360bbb-3(b)(1), unless the authorization is terminated or revoked.  Performed at [facility], [address] [place], [place] [address]      Labs: Basic Metabolic Panel: Recent Labs  Lab 02/17/21 1210  NA 138  K 3.7  CL 103  CO2 24  GLUCOSE 107*  BUN 14  CREATININE 0.85  CALCIUM 9.5   Liver Function Tests: No results for input(s): AST, ALT, ALKPHOS, BILITOT, PROT, ALBUMIN in the last 168 hours. No results for input(s): LIPASE, AMYLASE in the last 168 hours. No results for input(s): AMMONIA in the last 168 hours. CBC: Recent Labs  Lab 02/17/21 1210  WBC 5.5  HGB 13.6  HCT 39.5  MCV 101.0*  PLT 224  Cardiac Enzymes: No results for input(s): CKTOTAL, CKMB, CKMBINDEX, TROPONINI in the last 168 hours. BNP: BNP (last 3 results) No results for input(s): BNP in the last 8760 hours.  ProBNP (last 3 results) No results for input(s): PROBNP in the last 8760 hours.  CBG: No results for input(s): GLUCAP in the last 168 hours.     Signed:  Desma Maxim MD.  [facility] 02/18/2021, 10:33 AM

## 2021-02-25 ENCOUNTER — Ambulatory Visit (INDEPENDENT_AMBULATORY_CARE_PROVIDER_SITE_OTHER): Payer: Medicare Other | Admitting: Obstetrics and Gynecology

## 2021-02-25 ENCOUNTER — Other Ambulatory Visit: Payer: Self-pay

## 2021-02-25 ENCOUNTER — Encounter: Payer: Self-pay | Admitting: Obstetrics and Gynecology

## 2021-02-25 VITALS — BP 174/100 | Ht 66.0 in | Wt 186.0 lb

## 2021-02-25 DIAGNOSIS — R300 Dysuria: Secondary | ICD-10-CM

## 2021-02-25 DIAGNOSIS — Z1331 Encounter for screening for depression: Secondary | ICD-10-CM

## 2021-02-25 DIAGNOSIS — Z1339 Encounter for screening examination for other mental health and behavioral disorders: Secondary | ICD-10-CM

## 2021-02-25 DIAGNOSIS — Z01419 Encounter for gynecological examination (general) (routine) without abnormal findings: Secondary | ICD-10-CM

## 2021-02-25 DIAGNOSIS — Z1211 Encounter for screening for malignant neoplasm of colon: Secondary | ICD-10-CM

## 2021-02-25 LAB — POCT URINALYSIS DIPSTICK
Bilirubin, UA: NEGATIVE
Blood, UA: POSITIVE
Glucose, UA: NEGATIVE
Ketones, UA: NEGATIVE
Leukocytes, UA: NEGATIVE
Nitrite, UA: NEGATIVE
Protein, UA: NEGATIVE
Spec Grav, UA: 1.01 (ref 1.010–1.025)
Urobilinogen, UA: 0.2 E.U./dL
pH, UA: 5 (ref 5.0–8.0)

## 2021-02-25 NOTE — Progress Notes (Addendum)
Gynecology Annual Exam  PCP: Patient, No Pcp Per (Inactive)  Chief Complaint  Patient presents with   Annual Exam   Urinary Tract Infection   History of Present Illness:  [name] is a 50 y.o. [id number] who LMP was No LMP recorded. Patient is perimenopausal., presents today for her annual examination.  Her menses are absent since April.  Her last menses with very light. She says her iron levels have improved a lot since her periods are no longer heavy.    She does have vasomotor sx. She has started taking black cohash and multivitamins to help. She states that this does help.  (Zinc, D3).    She is sexually active. She does have vaginal dryness.  Her pain is on the right lower quadrant.   Last Pap: 01/2020  Results were: no abnormalities /HPV DNA negative  She has a history of HPV +(HPV 16/18 neg/neg) in 2019 with normal cytology.  Hx of STDs: distant.  Nothing recent.   Last mammogram: 05/2020. History of breast cancer.  Results were: normal--routine follow-up in 12 months. She is followed by the cancer center.   There is no FH of breast cancer. There is no FH of ovarian cancer. She has a personal history of breast cancer.  She has her breast exams with Dr. Tasia Catchings.    She states that her oncologist has done some lab work and her glucose was elevated to 152.   Colonoscopy: never had.  DEXA: has not been screened for osteoporosis  Tobacco use: smokes about 3 cigs per day Alcohol use: much less.  AUDIT = 7 today, was 17 last week.   Exercise: no  She has been going to [facility] for weight loss and HRT.  She says her BP was 153/96 at [facility].  She hasn't been to her PCP in a while.   The patient wears seatbelts: yes.     She has some mild discomfort with voiding, but empties readily. Denies hematuria.   She denies chest pain, trouble breathing, headache, visual changes.   Past Medical History:  Diagnosis Date   Anemia    Anxiety    Arthritis    right knee    Cancer  ([place]) 2019   BREAST- Left   Depression    Fibroid    GERD (gastroesophageal reflux disease)     Past Surgical History:  Procedure Laterality Date   BREAST BIOPSY Left 2019   [facility]   BREAST LUMPECTOMY Left 2019   BREAST LUMPECTOMY WITH AXILLARY LYMPH NODE BIOPSY Left 11/24/2017   Procedure: LEFT BREAST LUMPECTOMY WITH LEFT SENTINEL NODE LYMPH NODE BIOPSY;  Surgeon: Jovita Kussmaul, MD;  Location: [facility] ORS;  Service: General;  Laterality: Left;   CESAREAN SECTION     EYE SURGERY     DETACHED RETINA 4/519   HYSTEROSCOPY WITH D & C N/A 03/14/2019   Procedure: DILATATION AND CURETTAGE [name];  Surgeon: Will Bonnet, MD;  Location: [facility] ORS;  Service: Gynecology;  Laterality: N/A;   IR EMBO TUMOR ORGAN [employer] GUIDE ROADMAPPING      Prior to Admission medications   Medication Sig Start Date End Date Taking? Authorizing Provider  cimetidine (TAGAMET HB) 200 MG tablet Take 200 mg by mouth daily as needed (acid reflux).   Yes [provider]    Allergies  Allergen Reactions   Demerol [Meperidine] Anaphylaxis    Pt does not tolerate pain killers.   Oxycodone Anaphylaxis   Sulfa  Antibiotics Anaphylaxis   Percocet [Oxycodone-Acetaminophen] Swelling   Obstetric History: [id number]  Family History  Problem Relation Age of Onset   Stroke Maternal Grandmother    Breast cancer Neg Hx     Social History   Socioeconomic History   Marital status: Divorced    Spouse name: Not on file   Number of children: Not on file   Years of education: Not on file   Highest education level: Not on file  Occupational History   Not on file  Tobacco Use   Smoking status: Every Day    Packs/day: 0.25    Years: 13.00    Pack years: 3.25    Types: Cigarettes   Smokeless tobacco: Never  Vaping Use   Vaping Use: Never used  Substance and Sexual Activity   Alcohol use: Yes    Comment: weekends   Drug use: No   Sexual activity: Yes    Birth  control/protection: None  Other Topics Concern   Not on file  Social History Narrative   ** Merged History Encounter **       Social Determinants of Health   Financial Resource Strain: Not on file  Food Insecurity: Not on file  Transportation Needs: Not on file  Physical Activity: Not on file  Stress: Not on file  Social Connections: Not on file  Intimate Partner Violence: Not on file    Review of Systems  Constitutional:  Positive for malaise/fatigue. Negative for chills, diaphoresis, fever and weight loss.       +hot flashes  HENT: Negative.    Eyes: Negative.        +change in vision  Respiratory:  Positive for shortness of breath. Negative for cough, hemoptysis, sputum production and wheezing.   Cardiovascular:  Positive for chest pain (not currently). Negative for palpitations, orthopnea, claudication, leg swelling and PND.  Gastrointestinal: Negative.   Genitourinary:  Positive for dysuria. Negative for flank pain, frequency, hematuria and urgency.  Musculoskeletal: Negative.   Skin: Negative.   Neurological: Negative.   Psychiatric/Behavioral: Negative.      Physical Exam BP (!) 174/100   Ht '5\' 6"'$  (1.676 m)   Wt 186 lb (84.4 kg)   BMI 30.02 kg/m   Physical Exam Constitutional:      General: She is not in acute distress.    Appearance: Normal appearance. She is well-developed.  Genitourinary:     Vulva and bladder normal.     No cervical friability, lesion or polyp.     Pelvic exam was performed with patient in the lithotomy position.  HENT:     Head: Normocephalic and atraumatic.  Eyes:     General: No scleral icterus.    Conjunctiva/sclera: Conjunctivae normal.  Cardiovascular:     Rate and Rhythm: Normal rate and regular rhythm.     Heart sounds: No murmur heard.   No friction rub. No gallop.  Pulmonary:     Effort: Pulmonary effort is normal. No respiratory distress.     Breath sounds: Normal breath sounds. No wheezing or rales.  Chest:      Comments: Deferred Abdominal:     General: Bowel sounds are normal. There is no distension.     Palpations: Abdomen is soft. There is no mass.     Tenderness: There is no abdominal tenderness. There is no guarding or rebound.  Musculoskeletal:        General: Normal range of motion.     Cervical back: Normal range of motion and  neck supple.  Neurological:     General: No focal deficit present.     Mental Status: She is alert and oriented to person, place, and time.     Cranial Nerves: No cranial nerve deficit.  Skin:    General: Skin is warm and dry.     Findings: No erythema.  Psychiatric:        Mood and Affect: Mood normal.        Behavior: Behavior normal.        Judgment: Judgment normal.    Female chaperone present for pelvic and breast  portions of the physical exam  Results: AUDIT Questionnaire (screen for alcoholism): 7 PHQ-9: 15 GAD7: 11  Assessment: 50 y.o. [id number] female here for routine gynecologic examination.  Plan: Problem List Items Addressed This Visit   None Visit Diagnoses     Women's annual routine gynecological examination    -  Primary   Screening for depression       Screening for alcoholism       Dysuria       Relevant Orders   POCT urinalysis dipstick (Completed)   Urine Culture      Screening: -- Blood pressure screen elevated, needs PCP. If develops symptoms of SOB, chest pain, headache, feeling lightheaded, swelling, or other sx of concern, needs to go to ER. Recent workup (within a few days) negative.  She saw a cardiologist 3 days ago.  -- Colonoscopy - due - will schedule -- Mammogram -  will be arranged through cancer center -- Weight screening: overweight: continue to monitor -- Depression screening negative (PHQ-9) -- Nutrition: normal -- cholesterol screening: per PCP -- osteoporosis screening: not due -- tobacco screening: using: discussed quitting using the 5 A's -- alcohol screening: AUDIT questionnaire indicates low-risk  usage. -- family history of breast cancer screening: done. not at high risk. -- no evidence of domestic violence or intimate partner violence. -- STD screening: gonorrhea/chlamydia NAAT collected -- pap smear not collected per ASCCP guidelines (collected through [facility])    [name], MD 02/25/2021 10:03 AM

## 2021-02-27 LAB — URINE CULTURE

## 2021-03-02 ENCOUNTER — Telehealth: Payer: Self-pay

## 2021-03-02 NOTE — Telephone Encounter (Signed)
CALLED PATIENT NO ANSWER LEFT VOICEMAIL FOR A CALL BACK ? ?

## 2021-05-31 ENCOUNTER — Ambulatory Visit
Admission: RE | Admit: 2021-05-31 | Discharge: 2021-05-31 | Disposition: A | Discharge status: Home / Self Care | Payer: Medicare Other | Source: Ambulatory Visit | Attending: Oncology | Admitting: Oncology

## 2021-05-31 ENCOUNTER — Other Ambulatory Visit: Payer: Self-pay

## 2021-05-31 DIAGNOSIS — Z1231 Encounter for screening mammogram for malignant neoplasm of breast: Secondary | ICD-10-CM | POA: Diagnosis not present

## 2021-05-31 DIAGNOSIS — Z171 Estrogen receptor negative status [ER-]: Secondary | ICD-10-CM | POA: Diagnosis not present

## 2021-05-31 DIAGNOSIS — C50812 Malignant neoplasm of overlapping sites of left female breast: Secondary | ICD-10-CM | POA: Insufficient documentation

## 2021-06-07 IMAGING — MG DIGITAL DIAGNOSTIC BILATERAL MAMMOGRAM WITH TOMO AND CAD
6 of 11 series · 6 of 31 positions shown · non-contrast
Comparison: Previous exam(s).

CLINICAL DATA: History of treated left breast cancer, status post
lumpectomy in 3007.

EXAM:
DIGITAL DIAGNOSTIC BILATERAL MAMMOGRAM WITH CAD AND TOMO

[L MLO]
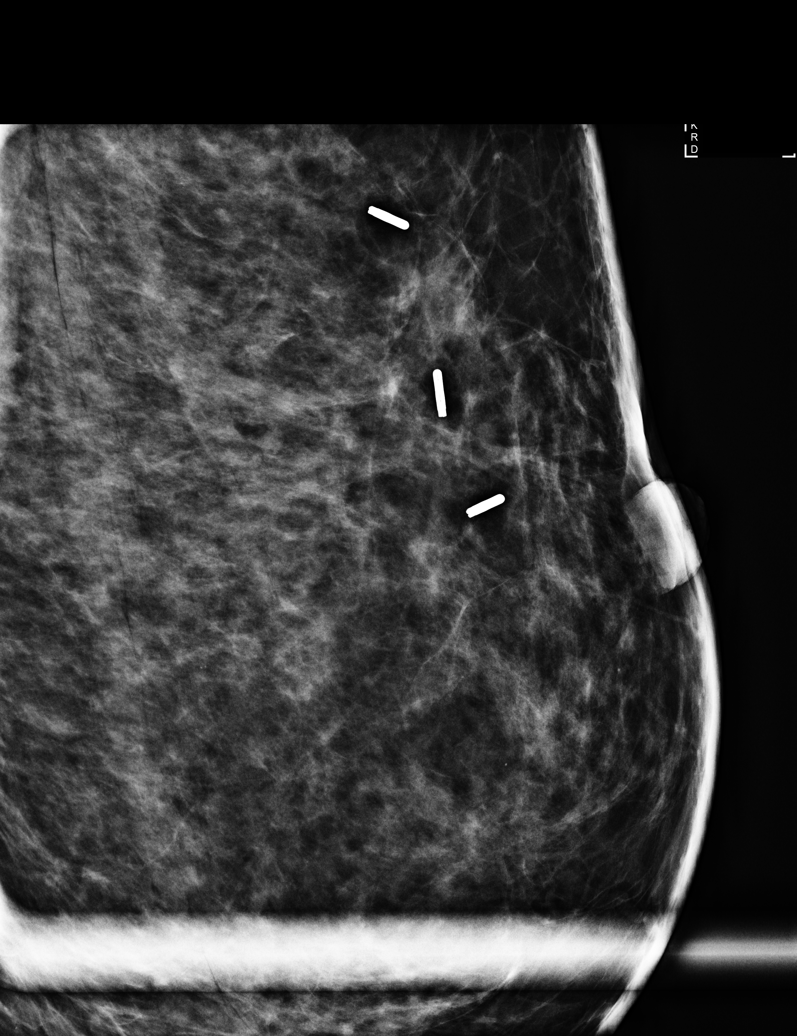

[R CC synth-2D]
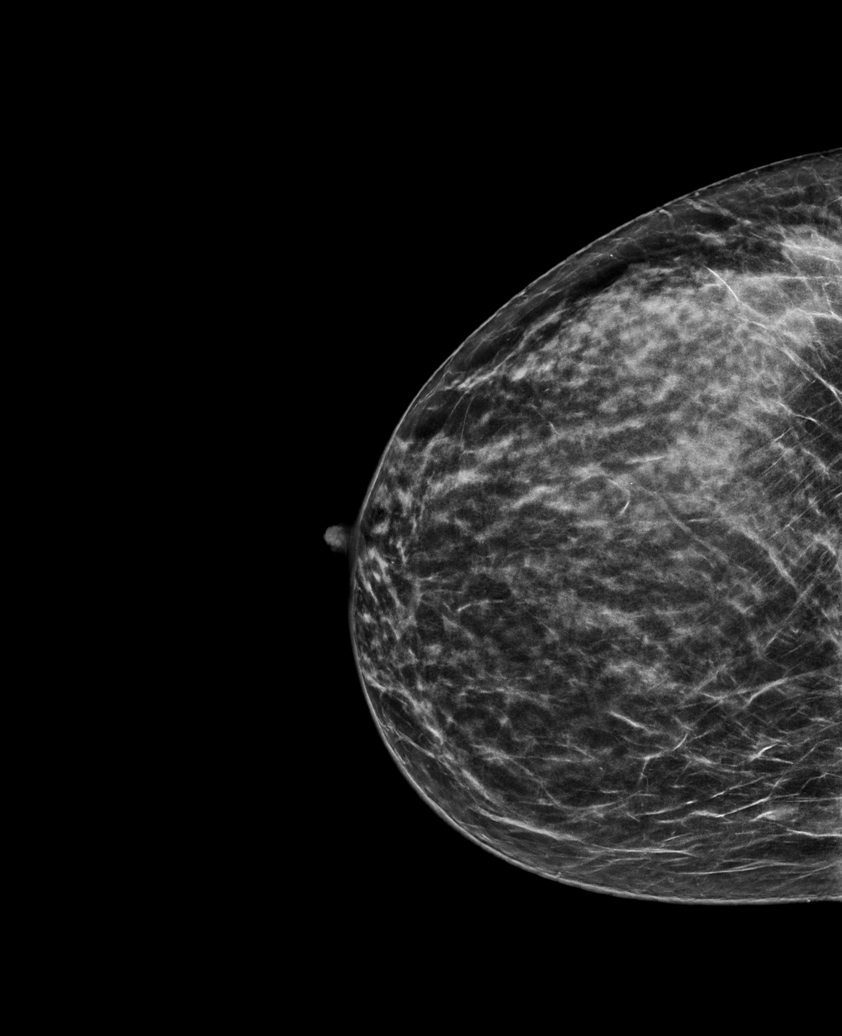

[L CC synth-2D]
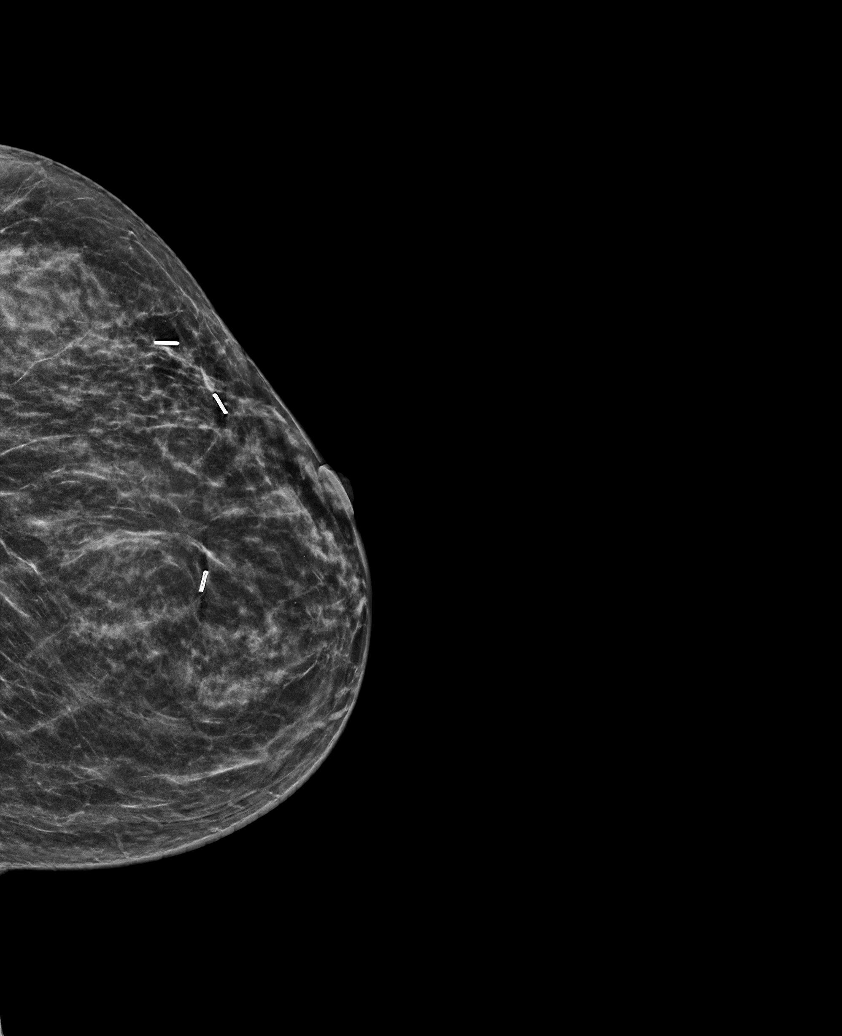

[L MLO synth-2D]
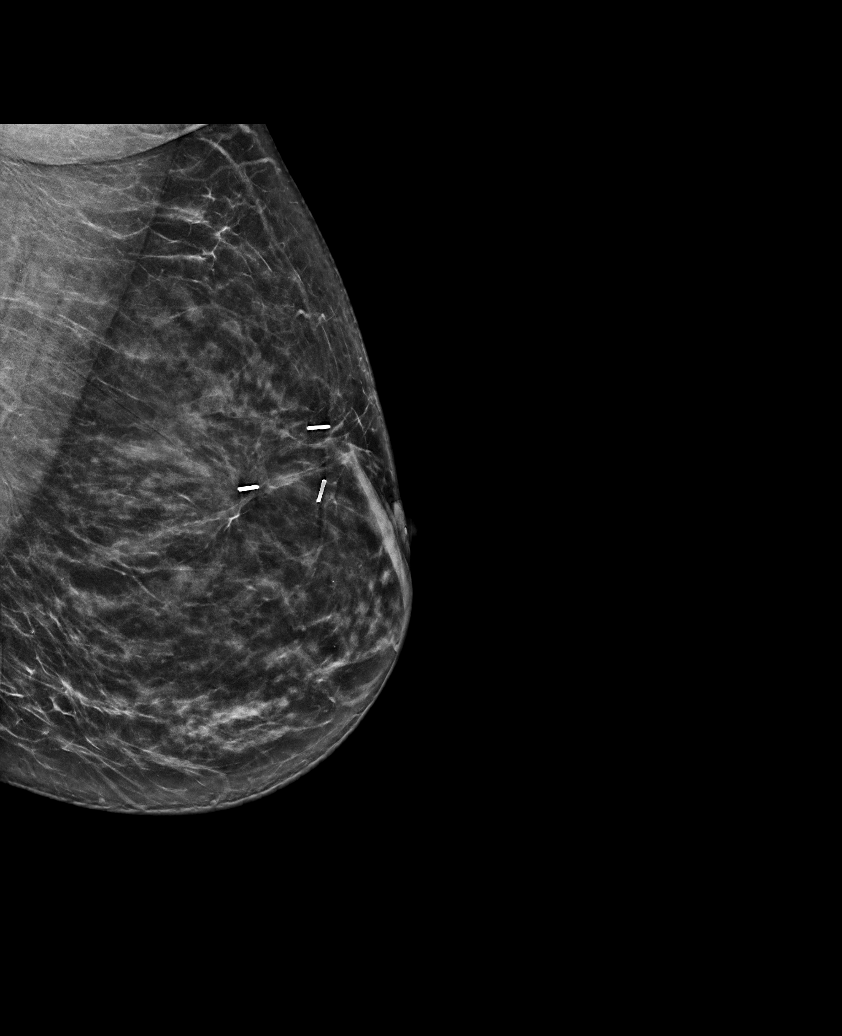

[R MLO synth-2D]
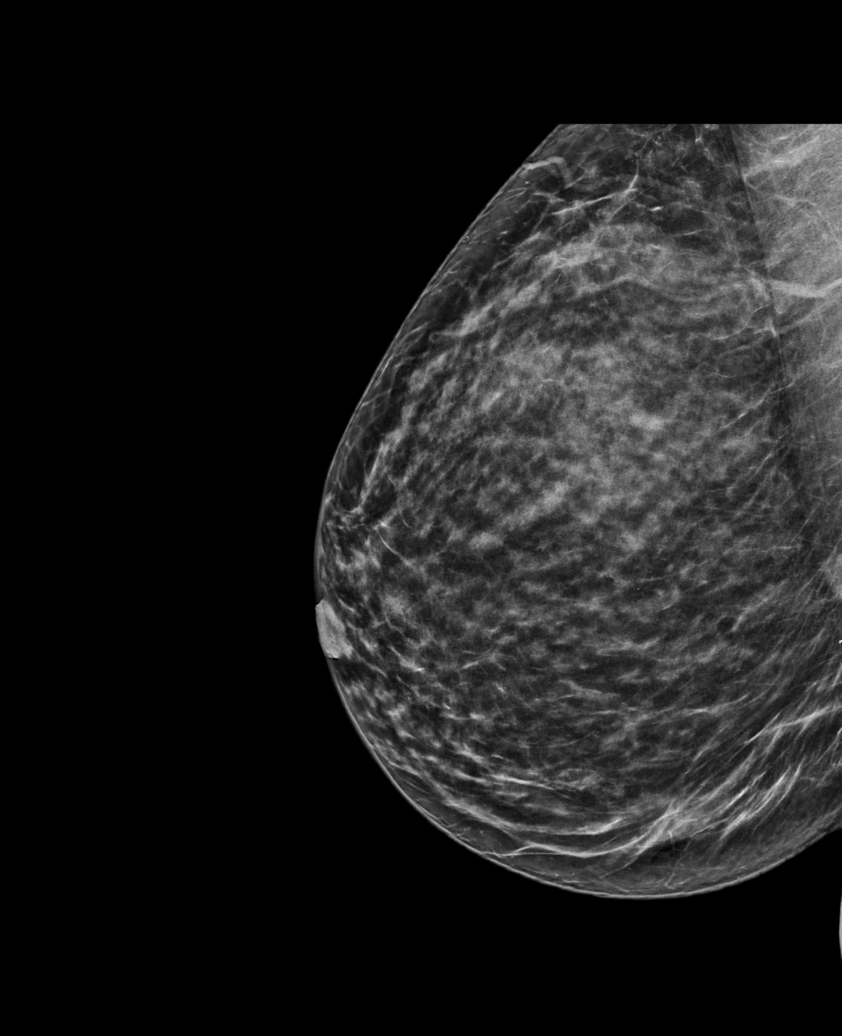

[R XCCL synth-2D]
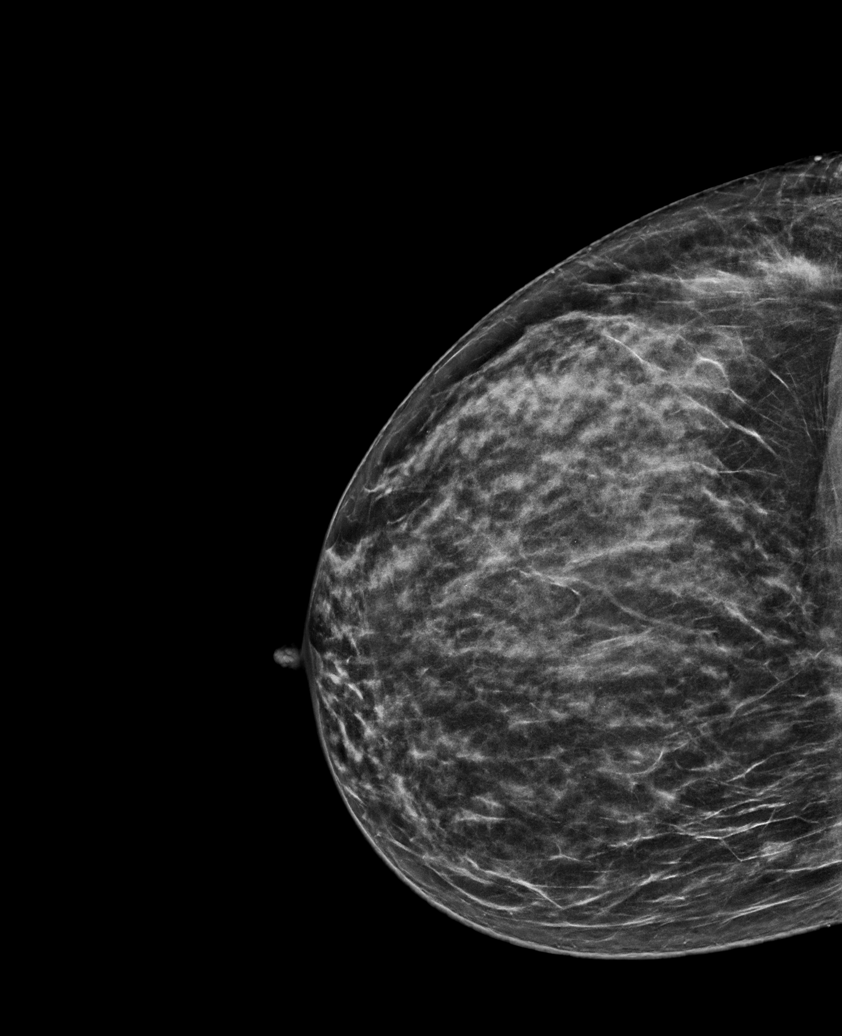

[6 of 31 positions shown; findings below may reference images not displayed]

ACR Breast Density Category c: The breast tissue is heterogeneously
dense, which may obscure small masses.
FINDINGS: Mammographically, there are no suspicious masses, areas of
nonsurgical architectural distortion or microcalcifications in
either breast. Expected post lumpectomy changes in the left breast.

Mammographic images were processed with CAD.
IMPRESSION: No mammographic evidence of malignancy in either breast, status post
left lumpectomy.

RECOMMENDATION:
Diagnostic mammogram is suggested in 1 year. (Code:[id number]-9-359)

I have discussed the findings and recommendations with the patient.
Results were also provided in writing at the conclusion of the
visit. If applicable, a reminder letter will be sent to the patient
regarding the next appointment.

BI-RADS CATEGORY  2: Benign.

## 2021-06-10 ENCOUNTER — Inpatient Hospital Stay (HOSPITAL_BASED_OUTPATIENT_CLINIC_OR_DEPARTMENT_OTHER): Payer: Medicare Other | Admitting: Oncology

## 2021-06-10 ENCOUNTER — Inpatient Hospital Stay: Payer: Medicare Other | Attending: Oncology

## 2021-06-10 ENCOUNTER — Other Ambulatory Visit: Payer: Medicare Other

## 2021-06-10 ENCOUNTER — Ambulatory Visit: Payer: Medicare Other | Admitting: Oncology

## 2021-06-10 ENCOUNTER — Other Ambulatory Visit: Payer: Self-pay

## 2021-06-10 ENCOUNTER — Encounter: Payer: Self-pay | Admitting: Oncology

## 2021-06-10 VITALS — BP 161/111 | HR 72 | Temp 97.6°F | Resp 16 | Wt 185.0 lb

## 2021-06-10 DIAGNOSIS — C50812 Malignant neoplasm of overlapping sites of left female breast: Secondary | ICD-10-CM

## 2021-06-10 DIAGNOSIS — Z79899 Other long term (current) drug therapy: Secondary | ICD-10-CM | POA: Diagnosis not present

## 2021-06-10 DIAGNOSIS — Z171 Estrogen receptor negative status [ER-]: Secondary | ICD-10-CM | POA: Insufficient documentation

## 2021-06-10 DIAGNOSIS — D509 Iron deficiency anemia, unspecified: Secondary | ICD-10-CM | POA: Diagnosis not present

## 2021-06-10 LAB — COMPREHENSIVE METABOLIC PANEL
ALT: 16 U/L (ref 0–44)
AST: 18 U/L (ref 15–41)
Albumin: 4.2 g/dL (ref 3.5–5.0)
Alkaline Phosphatase: 49 U/L (ref 38–126)
Anion gap: 11 (ref 5–15)
BUN: 12 mg/dL (ref 6–20)
CO2: 26 mmol/L (ref 22–32)
Calcium: 9.3 mg/dL (ref 8.9–10.3)
Chloride: 102 mmol/L (ref 98–111)
Creatinine, Ser: 0.81 mg/dL (ref 0.44–1.00)
GFR, Estimated: >60 mL/min (ref >60)
Glucose, Bld: 113 mg/dL — ABNORMAL HIGH (ref 70–99)
Potassium: 3.7 mmol/L (ref 3.5–5.1)
Sodium: 139 mmol/L (ref 135–145)
Total Bilirubin: 0.2 mg/dL — ABNORMAL LOW (ref 0.3–1.2)
Total Protein: 7.4 g/dL (ref 6.5–8.1)

## 2021-06-10 LAB — CBC WITH DIFFERENTIAL/PLATELET
Abs Immature Granulocytes: 0.03 10*3/uL (ref 0.00–0.07)
Basophils Absolute: 0 10*3/uL (ref 0.0–0.1)
Basophils Relative: 0 %
Eosinophils Absolute: 0.1 10*3/uL (ref 0.0–0.5)
Eosinophils Relative: 2 %
HCT: 38.3 % (ref 36.0–46.0)
Hemoglobin: 12.9 g/dL (ref 12.0–15.0)
Immature Granulocytes: 1 %
Lymphocytes Relative: 30 %
Lymphs Abs: 1.6 10*3/uL (ref 0.7–4.0)
MCH: 32.9 pg (ref 26.0–34.0)
MCHC: 33.7 g/dL (ref 30.0–36.0)
MCV: 97.7 fL (ref 80.0–100.0)
Monocytes Absolute: 0.5 10*3/uL (ref 0.1–1.0)
Monocytes Relative: 8 %
Neutro Abs: 3.2 10*3/uL (ref 1.7–7.7)
Neutrophils Relative %: 59 %
Platelets: 211 10*3/uL (ref 150–400)
RBC: 3.92 MIL/uL (ref 3.87–5.11)
RDW: 12.7 % (ref 11.5–15.5)
WBC: 5.5 10*3/uL (ref 4.0–10.5)
nRBC: 0 % (ref 0.0–0.2)

## 2021-06-10 NOTE — Progress Notes (Signed)
Hematology/Oncology Follow up note [facility] [phone] [phone]   Patient Care Team: Patient, No Pcp Per (Inactive) as PCP - General (General Practice) Patient, No Pcp Per (Inactive) (General Practice) Earlie Server, MD as Consulting Physician (Hematology and Oncology) REASON FOR VISIT Follow up for treatment of triple negative breast cancer and iron deficiency anemia.     HISTORY OF PRESENTING ILLNESS:  Cindy Brewer is a  50 y.o.  female with PMH listed below who was referred to me for evaluation of newly diagnosed breast cancer.  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 two separate foci of invasive mammary carcinoma with associated tumor necrosis, DCIS present, fibroadenoma 24m, Foci 1 is 437m and foci 2 is 3561mmargins are negative. Sentinel LN 0/0 involved. Negative LVI declined neoadjuvant or adjuvant chemotherapy  # Genetic testing: BRCA neg Testing did not reveal a pathogenic mutation in any of the genes analyzed.  #Iron deficiency anemia status post multiple IV iron infusions. # Uterus Fibroid disease;  underwent endometrial biopsy establish care with GYN Dr. JacGlennon Macd had D&C, polypectomy, myomectomy   # Thyroid nodule, previously ordered thyroid ultrasound not done. INTERVAL HISTORY SheElaf Brewer a 49 77o. female who has above history reviewed by me today presents for follow up visit for Triple negative breast cancer   She does not have any breast concerns. She has no new concerns.   .  Review of Systems  Constitutional:  Negative for appetite change, chills, fatigue and fever.  HENT:   Negative for hearing loss and voice change.   Eyes:  Negative for eye problems.  Respiratory:  Negative for chest tightness, cough and shortness of breath.   Cardiovascular:  Negative for chest pain.   Gastrointestinal:  Negative for abdominal distention, abdominal pain and blood in stool.  Endocrine: Negative for hot flashes.  Genitourinary:  Negative for difficulty urinating and frequency.   Musculoskeletal:  Negative for arthralgias.  Skin:  Negative for itching and rash.  Neurological:  Negative for extremity weakness.  Hematological:  Negative for adenopathy.  Psychiatric/Behavioral:  Negative for confusion.     MEDICAL HISTORY:  Past Medical History:  Diagnosis Date   Anemia    Anxiety    Arthritis    right knee    Cancer ([id number]   BREAST- Left   Depression    Fibroid    GERD (gastroesophageal reflux disease)     SURGICAL HISTORY: Past Surgical History:  Procedure Laterality Date   BREAST BIOPSY Left 2019   IDC   BREAST LUMPECTOMY Left 2019   BREAST LUMPECTOMY WITH AXILLARY LYMPH NODE BIOPSY Left 11/24/2017   Procedure: LEFT BREAST LUMPECTOMY WITH LEFT SENTINEL NODE LYMPH NODE BIOPSY;  Surgeon: TotJovita KussmaulD;  Location: [facility] ORS;  Service: General;  Laterality: Left;   CESAREAN SECTION     EYE SURGERY     DETACHED RETINA 4/519   HYSTEROSCOPY WITH D & C N/A 03/14/2019   Procedure: DILATATION AND CURETTAGE /HYHenrene HawkingSurgeon: JacWill BonnetD;  Location: [facility] ORS;  Service: Gynecology;  Laterality: N/A;   IR EMBO TUMOR ORGAN ISCHEMIA INFARCT INC GUIDE ROADMAPPING      SOCIAL HISTORY: Social History   Socioeconomic History   Marital status: Divorced    Spouse name: Not on file   Number of children: Not on file   Years of education: Not on file  Highest education level: Not on file  Occupational History   Not on file  Tobacco Use   Smoking status: Every Day    Packs/day: 0.25    Years: 13.00    Pack years: 3.25    Types: Cigarettes   Smokeless tobacco: Never  Vaping Use   Vaping Use: Never used  Substance and Sexual Activity   Alcohol use: Yes    Comment: weekends   Drug use: No   Sexual activity: Yes    Birth  control/protection: None  Other Topics Concern   Not on file  Social History Narrative   ** Merged History Encounter **       Social Determinants of Health   Financial Resource Strain: Not on file  Food Insecurity: Not on file  Transportation Needs: Not on file  Physical Activity: Not on file  Stress: Not on file  Social Connections: Not on file  Intimate Partner Violence: Not on file    FAMILY HISTORY: Family History  Problem Relation Age of Onset   Stroke Maternal Grandmother    Breast cancer Neg Hx     ALLERGIES:  is allergic to demerol [meperidine], oxycodone, sulfa antibiotics, and percocet [oxycodone-acetaminophen].  MEDICATIONS:  Current Outpatient Medications  Medication Sig Dispense Refill   ASHWAGANDHA PO Take by mouth.     Black Cohosh 160 MG CAPS Take by mouth.     Cholecalciferol (VITAMIN D3) 1.25 MG (50000 UT) CAPS Take by mouth.     Cyanocobalamin (VITAMIN B 12 PO) Take by mouth.     latanoprost (XALATAN) 0.005 % ophthalmic solution Apply to eye.     losartan (COZAAR) 100 MG tablet Take 100 mg by mouth daily.     Multiple Vitamin (MULTIVITAMIN WITH MINERALS) TABS tablet Take 1 tablet by mouth daily.     Probiotic Product (PROBIOTIC-10) CHEW Chew by mouth.     pyridOXINE (VITAMIN B-6) 100 MG tablet Take 100 mg by mouth daily.     Thiamine HCl (VITAMIN B-1) 250 MG tablet Take 250 mg by mouth daily.     vitamin k 100 MCG tablet Take 100 mcg by mouth daily.     No current facility-administered medications for this visit.   Facility-Administered Medications Ordered in Other Visits  Medication Dose Route Frequency Provider Last Rate Last Admin   0.9 %  sodium chloride infusion   Intravenous Continuous Earlie Server, MD 10 mL/hr at 01/11/19 1412 New Bag at 01/11/19 1412     PHYSICAL EXAMINATION: ECOG PERFORMANCE STATUS: 1 - Symptomatic but completely ambulatory Vitals:   06/10/21 1351 06/10/21 1353  BP:  (!) 161/111  Pulse:  72  Resp:  16  Temp: 97.6 F  (36.4 C) 97.6 F (36.4 C)  SpO2: 99% 99%   Filed Weights   06/10/21 1351  Weight: 185 lb (83.9 kg)    Physical Exam Constitutional:      General: She is not in acute distress.    Appearance: She is not diaphoretic.  HENT:     Head: Normocephalic and atraumatic.     Nose: Nose normal.     Mouth/Throat:     Pharynx: No oropharyngeal exudate.  Eyes:     General: No scleral icterus.       Left eye: No discharge.     Pupils: Pupils are equal, round, and reactive to light.  Neck:     Vascular: No JVD.  Cardiovascular:     Rate and Rhythm: Normal rate and regular rhythm.  Heart sounds: Normal heart sounds. No murmur heard. Pulmonary:     Effort: Pulmonary effort is normal. No respiratory distress.     Breath sounds: Normal breath sounds. No wheezing or rales.  Chest:     Chest wall: No tenderness.  Abdominal:     General: Bowel sounds are normal. There is no distension.     Palpations: Abdomen is soft. There is no mass.     Tenderness: There is no abdominal tenderness. There is no rebound.  Musculoskeletal:        General: No tenderness. Normal range of motion.     Cervical back: Normal range of motion and neck supple.  Lymphadenopathy:     Cervical: No cervical adenopathy.  Skin:    General: Skin is warm and dry.     Coloration: Skin is not pale.     Findings: No erythema or rash.  Neurological:     Mental Status: She is alert and oriented to person, place, and time.     Cranial Nerves: No cranial nerve deficit.     Motor: No abnormal muscle tone.     Coordination: Coordination normal.  Psychiatric:        Mood and Affect: Affect normal.        Cognition and Memory: Memory normal.        Judgment: Judgment normal.   Breast exam was performed in seated and lying down position. Patient is status post left lumpectomy with a well-healing surgical scar.no palpable breast masses or axillary lymphadenopathy bilaterally.       LABORATORY DATA:  I have reviewed the  data as listed Lab Results  Component Value Date   WBC 5.5 06/10/2021   HGB 12.9 06/10/2021   HCT 38.3 06/10/2021   MCV 97.7 06/10/2021   PLT 211 06/10/2021   Recent Labs    08/04/20 1502 02/03/21 1253 02/17/21 1210 06/10/21 1313  NA 139 135 138 139  K 3.7 3.8 3.7 3.7  CL 100 101 103 102  CO2 _0 GLUCOSE 75 152* 107* 113*  BUN _1 CREATININE 0.88 0.78 0.85 0.81  CALCIUM 9.6 9.1 9.5 9.3  GFRNONAA >60 >60 >60 >60  PROT 8.4* 7.5  --  7.4  ALBUMIN 4.9 4.3  --  4.2  AST 26 29  --  18  ALT 16 23  --  16  ALKPHOS 32* 43  --  49  BILITOT 0.6 0.5  --  0.2*     ASSESSMENT & PLAN:   Cancer Staging  Malignant neoplasm of overlapping sites of left breast in female, estrogen receptor negative ([place]) Staging form: Breast, AJCC 8th Edition - Clinical stage from 08/25/2017: Stage IIB (cT2(2), cN0, cM0, G3, ER-, PR-, HER2-) - Signed by Earlie Server, MD on 08/25/2017  1. Malignant neoplasm of overlapping sites of left breast in female, estrogen receptor negative ([place])    # 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 High recurrence risk.  05/31/21 Bilateral mammogram showed no mammographic evidence of malignancy   Recommend patient to continue calcium and vitamin D supplementation.   Return of visit: 6 months.   Orders Placed This Encounter  Procedures   CBC with Differential/Platelet    Standing Status:   Future    Standing Expiration Date:   06/10/2022   Comprehensive metabolic panel    Standing Status:   Future    Standing Expiration Date:   06/10/2022  Cancer antigen 27.29    Standing Status:   Future    Standing Expiration Date:   06/10/2022   Cancer antigen 15-3    Standing Status:   Future    Standing Expiration Date:   06/10/2022   We spent sufficient time to discuss many aspect of care, questions were answered to patient's satisfaction.   Earlie Server, MD, PhD 06/10/2021

## 2021-06-10 NOTE — Progress Notes (Signed)
Pt in for follow up reports doing well, denies concerns.  Pt has added several vitamins and supplements to her daily regime.

## 2021-09-28 DIAGNOSIS — E782 Mixed hyperlipidemia: Secondary | ICD-10-CM | POA: Diagnosis not present

## 2021-09-28 DIAGNOSIS — R002 Palpitations: Secondary | ICD-10-CM | POA: Diagnosis not present

## 2021-09-28 DIAGNOSIS — I10 Essential (primary) hypertension: Secondary | ICD-10-CM | POA: Diagnosis not present

## 2021-11-30 ENCOUNTER — Encounter: Payer: Self-pay | Admitting: Oncology

## 2021-12-06 ENCOUNTER — Encounter: Payer: Self-pay | Admitting: Oncology

## 2021-12-07 ENCOUNTER — Inpatient Hospital Stay: Payer: Medicare HMO | Attending: Oncology

## 2021-12-07 DIAGNOSIS — Z171 Estrogen receptor negative status [ER-]: Secondary | ICD-10-CM | POA: Diagnosis not present

## 2021-12-07 DIAGNOSIS — Z79899 Other long term (current) drug therapy: Secondary | ICD-10-CM | POA: Diagnosis not present

## 2021-12-07 DIAGNOSIS — C50812 Malignant neoplasm of overlapping sites of left female breast: Secondary | ICD-10-CM | POA: Diagnosis not present

## 2021-12-07 LAB — CBC WITH DIFFERENTIAL/PLATELET
Abs Immature Granulocytes: 0.04 10*3/uL (ref 0.00–0.07)
Basophils Absolute: 0.1 10*3/uL (ref 0.0–0.1)
Basophils Relative: 1 %
Eosinophils Absolute: 0.2 10*3/uL (ref 0.0–0.5)
Eosinophils Relative: 2 %
HCT: 43.9 % (ref 36.0–46.0)
Hemoglobin: 14.8 g/dL (ref 12.0–15.0)
Immature Granulocytes: 1 %
Lymphocytes Relative: 21 %
Lymphs Abs: 1.7 10*3/uL (ref 0.7–4.0)
MCH: 33.7 pg (ref 26.0–34.0)
MCHC: 33.7 g/dL (ref 30.0–36.0)
MCV: 100 fL (ref 80.0–100.0)
Monocytes Absolute: 0.6 10*3/uL (ref 0.1–1.0)
Monocytes Relative: 7 %
Neutro Abs: 5.5 10*3/uL (ref 1.7–7.7)
Neutrophils Relative %: 68 %
Platelets: 281 10*3/uL (ref 150–400)
RBC: 4.39 MIL/uL (ref 3.87–5.11)
RDW: 12.3 % (ref 11.5–15.5)
WBC: 8 10*3/uL (ref 4.0–10.5)
nRBC: 0 % (ref 0.0–0.2)

## 2021-12-07 LAB — COMPREHENSIVE METABOLIC PANEL
ALT: 30 U/L (ref 0–44)
AST: 47 U/L — ABNORMAL HIGH (ref 15–41)
Albumin: 4.4 g/dL (ref 3.5–5.0)
Alkaline Phosphatase: 53 U/L (ref 38–126)
Anion gap: 11 (ref 5–15)
BUN: 15 mg/dL (ref 6–20)
CO2: 26 mmol/L (ref 22–32)
Calcium: 9.2 mg/dL (ref 8.9–10.3)
Chloride: 102 mmol/L (ref 98–111)
Creatinine, Ser: 0.8 mg/dL (ref 0.44–1.00)
GFR, Estimated: >60 mL/min (ref >60)
Glucose, Bld: 110 mg/dL — ABNORMAL HIGH (ref 70–99)
Potassium: 3.7 mmol/L (ref 3.5–5.1)
Sodium: 139 mmol/L (ref 135–145)
Total Bilirubin: 0.5 mg/dL (ref 0.3–1.2)
Total Protein: 8.6 g/dL — ABNORMAL HIGH (ref 6.5–8.1)

## 2021-12-09 ENCOUNTER — Inpatient Hospital Stay (HOSPITAL_BASED_OUTPATIENT_CLINIC_OR_DEPARTMENT_OTHER): Payer: Medicare HMO | Admitting: Oncology

## 2021-12-09 ENCOUNTER — Encounter: Payer: Self-pay | Admitting: Oncology

## 2021-12-09 VITALS — BP 144/102 | HR 99 | Temp 98.8°F | Wt 181.0 lb

## 2021-12-09 DIAGNOSIS — C50812 Malignant neoplasm of overlapping sites of left female breast: Secondary | ICD-10-CM

## 2021-12-09 DIAGNOSIS — Z171 Estrogen receptor negative status [ER-]: Secondary | ICD-10-CM

## 2021-12-09 DIAGNOSIS — Z79899 Other long term (current) drug therapy: Secondary | ICD-10-CM | POA: Diagnosis not present

## 2021-12-09 LAB — CANCER ANTIGEN 15-3: CA 15-3: 17.7 U/mL (ref 0.0–25.0)

## 2021-12-09 LAB — CANCER ANTIGEN 27.29: CA 27.29: 19.2 U/mL (ref 0.0–38.6)

## 2021-12-09 NOTE — Progress Notes (Signed)
Hematology/Oncology Progress note [phone] [phone]      Patient Care Team: Patient, No Pcp Per (Inactive) as PCP - General (General Practice) Patient, No Pcp Per (Inactive) (General Practice) Earlie Server, MD as Consulting Physician (Hematology and Oncology) REASON FOR VISIT Follow up for triple negative breast cancer and iron deficiency anemia.     HISTORY OF PRESENTING ILLNESS:  Cindy Brewer is a  51 y.o.  female with PMH listed below who was referred to me for evaluation of newly diagnosed breast cancer.  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 two separate foci of invasive mammary carcinoma with associated tumor necrosis, DCIS present, fibroadenoma 61m, Foci 1 is 475m and foci 2 is 3518mmargins are negative. Sentinel LN 0/0 involved. Negative LVI declined neoadjuvant or adjuvant chemotherapy  # Genetic testing: BRCA neg Testing did not reveal a pathogenic mutation in any of the genes analyzed.  #Iron deficiency anemia status post multiple IV iron infusions. # Uterus Fibroid disease;  underwent endometrial biopsy establish care with GYN Dr. JacGlennon Macd had D&C, polypectomy, myomectomy   # Thyroid nodule, previously ordered thyroid ultrasound not done.  05/31/21 Bilateral mammogram showed no mammographic evidence of malignancy   INTERVAL HISTORY SheCourtlyn Brewer a [age]o. female who has above history reviewed by me today presents for follow up visit for Triple negative breast cancer  Patient reports feeling well.  She has no new breast concerns. .  Review of Systems  Constitutional:  Negative for appetite change, chills, fatigue and fever.  HENT:   Negative for hearing loss and voice change.   Eyes:  Negative for eye problems.  Respiratory:  Negative for chest tightness, cough and shortness of breath.    Cardiovascular:  Negative for chest pain.  Gastrointestinal:  Negative for abdominal distention, abdominal pain and blood in stool.  Endocrine: Negative for hot flashes.  Genitourinary:  Negative for difficulty urinating and frequency.   Musculoskeletal:  Negative for arthralgias.  Skin:  Negative for itching and rash.  Neurological:  Negative for extremity weakness.  Hematological:  Negative for adenopathy.  Psychiatric/Behavioral:  Negative for confusion.      MEDICAL HISTORY:  Past Medical History:  Diagnosis Date   Anemia    Anxiety    Arthritis    right knee    [id number]   BREAST- Left   Depression    Fibroid    GERD (gastroesophageal reflux disease)     SURGICAL HISTORY: Past Surgical History:  Procedure Laterality Date   BREAST BIOPSY Left 2019   IDC   BREAST LUMPECTOMY Left 2019   BREAST LUMPECTOMY WITH AXILLARY LYMPH NODE BIOPSY Left 11/24/2017   Procedure: LEFT BREAST LUMPECTOMY WITH LEFT SENTINEL NODE LYMPH NODE BIOPSY;  Surgeon: TotJovita KussmaulD;  Location: [facility] OR[facility];  Service: General;  Laterality: Left;   CESAREAN SECTION     EYE SURGERY     DETACHED RETINA 4/519   HYSTEROSCOPY WITH D & C N/A 03/14/2019   Procedure: DILATATION AND CURETTAGE /HYHenrene HawkingSurgeon: JacWill BonnetD;  Location: [facility] OR[facility];  Service: Gynecology;  Laterality: N/A;   IR EMBO TUMOR ORGAN ISCHEMIA INFARCT INC GUIDE ROADMAPPING      SOCIAL HISTORY: Social History   Socioeconomic History   Marital status: Divorced    Spouse name: Not on file   Number of children: Not on file   Years  of education: Not on file   Highest education level: Not on file  Occupational History   Not on file  Tobacco Use   Smoking status: Every Day    Packs/day: 0.25    Years: 13.00    Total pack years: 3.25    Types: Cigarettes   Smokeless tobacco: Never  Vaping Use   Vaping Use: Never used  Substance and Sexual Activity   Alcohol use: Yes    Comment: weekends    Drug use: No   Sexual activity: Yes    Birth control/protection: None  Other Topics Concern   Not on file  Social History Narrative   ** Merged History Encounter **       Social Determinants of Health   Financial Resource Strain: Not on file  Food Insecurity: Not on file  Transportation Needs: Not on file  Physical Activity: Insufficiently Active (08/03/2017)   Exercise Vital Sign    Days of Exercise per Week: 7 days    Minutes of Exercise per Session: 20 min  Stress: Not on file  Social Connections: Socially Isolated (08/03/2017)   Social Connection and Isolation Panel [NHANES]    Frequency of Communication with Friends and Family: Once a week    Frequency of Social Gatherings with Friends and Family: Once a week    Attends Religious Services: Never    Marine scientist or Organizations: No    Attends Music therapist: Never    Marital Status: Divorced  Human resources officer Violence: Not on file    FAMILY HISTORY: Family History  Problem Relation Age of Onset   Stroke Maternal Grandmother    Breast cancer Neg Hx     ALLERGIES:  is allergic to demerol [meperidine], oxycodone, sulfa antibiotics, and percocet [oxycodone-acetaminophen].  MEDICATIONS:  Current Outpatient Medications  Medication Sig Dispense Refill   ASHWAGANDHA PO Take by mouth.     Black Cohosh 160 MG CAPS Take by mouth.     Cholecalciferol (VITAMIN D3) 1.25 MG (50000 UT) CAPS Take by mouth.     Cyanocobalamin (VITAMIN B 12 PO) Take by mouth.     latanoprost (XALATAN) 0.005 % ophthalmic solution Apply to eye.     losartan (COZAAR) 100 MG tablet Take 100 mg by mouth daily.     Multiple Vitamin (MULTIVITAMIN WITH MINERALS) TABS tablet Take 1 tablet by mouth daily.     Probiotic Product (PROBIOTIC-10) CHEW Chew by mouth.     pyridOXINE (VITAMIN B-6) 100 MG tablet Take 100 mg by mouth daily.     Thiamine HCl (VITAMIN B-1) 250 MG tablet Take 250 mg by mouth daily.     vitamin k 100 MCG tablet  Take 100 mcg by mouth daily.     No current facility-administered medications for this visit.   Facility-Administered Medications Ordered in Other Visits  Medication Dose Route Frequency Provider Last Rate Last Admin   0.9 %  sodium chloride infusion   Intravenous Continuous Earlie Server, MD 10 mL/hr at 01/11/19 1412 New Bag at 01/11/19 1412     PHYSICAL EXAMINATION: ECOG PERFORMANCE STATUS: 0 - Asymptomatic Vitals:   12/09/21 1333  BP: (!) 144/102  Pulse: 99  Temp: 98.8 F (37.1 C)   Filed Weights   12/09/21 1333  Weight: 181 lb (82.1 kg)    Physical Exam Constitutional:      General: She is not in acute distress.    Appearance: She is not diaphoretic.  HENT:     Head: Normocephalic  and atraumatic.     Nose: Nose normal.     Mouth/Throat:     Pharynx: No oropharyngeal exudate.  Eyes:     General: No scleral icterus.       Left eye: No discharge.     Pupils: Pupils are equal, round, and reactive to light.  Neck:     Vascular: No JVD.  Cardiovascular:     Rate and Rhythm: Normal rate and regular rhythm.     Heart sounds: Normal heart sounds. No murmur heard. Pulmonary:     Effort: Pulmonary effort is normal. No respiratory distress.     Breath sounds: Normal breath sounds. No wheezing or rales.  Chest:     Chest wall: No tenderness.  Abdominal:     General: Bowel sounds are normal. There is no distension.     Palpations: Abdomen is soft. There is no mass.     Tenderness: There is no abdominal tenderness. There is no rebound.  Musculoskeletal:        General: No tenderness. Normal range of motion.     Cervical back: Normal range of motion and neck supple.  Lymphadenopathy:     Cervical: No cervical adenopathy.  Skin:    General: Skin is warm and dry.     Coloration: Skin is not pale.     Findings: No erythema or rash.  Neurological:     Mental Status: She is alert and oriented to person, place, and time.     Cranial Nerves: No cranial nerve deficit.     Motor:  No abnormal muscle tone.     Coordination: Coordination normal.  Psychiatric:        Mood and Affect: Affect normal.        Cognition and Memory: Memory normal.        Judgment: Judgment normal.         LABORATORY DATA:  I have reviewed the data as listed Lab Results  Component Value Date   WBC 8.0 12/07/2021   HGB 14.8 12/07/2021   HCT 43.9 12/07/2021   MCV 100.0 12/07/2021   PLT 281 12/07/2021   Recent Labs    02/03/21 1253 02/17/21 1210 06/10/21 1313 12/07/21 1332  NA 135 138 139 139  K 3.8 3.7 3.7 3.7  CL 101 103 102 102  CO2 25 24 26 26   GLUCOSE 152* 107* 113* 110*  BUN 15 14 12 15   CREATININE 0.78 0.85 0.81 0.80  CALCIUM 9.1 9.5 9.3 9.2  GFRNONAA >60 >60 >60 >60  PROT 7.5  --  7.4 8.6*  ALBUMIN 4.3  --  4.2 4.4  AST 29  --  18 47*  ALT 23  --  16 30  ALKPHOS 43  --  49 53  BILITOT 0.5  --  0.2* 0.5     ASSESSMENT & PLAN:   Cancer Staging  Malignant neoplasm of overlapping sites of left breast in female, estrogen receptor negative ([place]) Staging form: Breast, AJCC 8th Edition - Clinical stage from 08/25/2017: Stage IIB (cT2(2), cN0, cM0, G3, ER-, PR-, HER2-) - Signed by Earlie Server, MD on 08/25/2017  1. Malignant neoplasm of overlapping sites of left breast in female, estrogen receptor negative ([place])    # 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 High recurrence risk.  Labs reviewed and discussed patient.  Cancer markers are within normal limits. Continue annual mammogram surveillance.  We will schedule next screening mammogram in November 2023. Recommend  patient to continue calcium and vitamin D supplementation.   Return of visit: 6 months.   Orders Placed This Encounter  Procedures   MM 3D SCREEN BREAST BILATERAL    Standing Status:   Future    Standing Expiration Date:   12/10/2022    Order Specific Question:   Reason for Exam (SYMPTOM  OR DIAGNOSIS REQUIRED)    Answer:   screening     Order Specific Question:   Is the patient pregnant?    Answer:   No    Order Specific Question:   Preferred imaging location?    Answer:   [facility]   CBC with Differential/Platelet    Standing Status:   Future    Standing Expiration Date:   12/10/2022   Comprehensive metabolic panel    Standing Status:   Future    Standing Expiration Date:   12/10/2022   Cancer antigen 27.29    Standing Status:   Future    Standing Expiration Date:   12/10/2022   Cancer antigen 15-3    Standing Status:   Future    Standing Expiration Date:   12/10/2022   We spent sufficient time to discuss many aspect of care, questions were answered to patient's satisfaction.   Earlie Server, MD, PhD 12/09/2021

## 2022-01-10 DIAGNOSIS — M9905 Segmental and somatic dysfunction of pelvic region: Secondary | ICD-10-CM | POA: Diagnosis not present

## 2022-01-10 DIAGNOSIS — M545 Low back pain, unspecified: Secondary | ICD-10-CM | POA: Diagnosis not present

## 2022-01-10 DIAGNOSIS — M9903 Segmental and somatic dysfunction of lumbar region: Secondary | ICD-10-CM | POA: Diagnosis not present

## 2022-01-10 DIAGNOSIS — M9904 Segmental and somatic dysfunction of sacral region: Secondary | ICD-10-CM | POA: Diagnosis not present

## 2022-01-14 DIAGNOSIS — M9903 Segmental and somatic dysfunction of lumbar region: Secondary | ICD-10-CM | POA: Diagnosis not present

## 2022-01-14 DIAGNOSIS — M545 Low back pain, unspecified: Secondary | ICD-10-CM | POA: Diagnosis not present

## 2022-01-14 DIAGNOSIS — M9905 Segmental and somatic dysfunction of pelvic region: Secondary | ICD-10-CM | POA: Diagnosis not present

## 2022-01-14 DIAGNOSIS — M9904 Segmental and somatic dysfunction of sacral region: Secondary | ICD-10-CM | POA: Diagnosis not present

## 2022-01-17 DIAGNOSIS — M9905 Segmental and somatic dysfunction of pelvic region: Secondary | ICD-10-CM | POA: Diagnosis not present

## 2022-01-17 DIAGNOSIS — M9904 Segmental and somatic dysfunction of sacral region: Secondary | ICD-10-CM | POA: Diagnosis not present

## 2022-01-17 DIAGNOSIS — M9903 Segmental and somatic dysfunction of lumbar region: Secondary | ICD-10-CM | POA: Diagnosis not present

## 2022-01-17 DIAGNOSIS — M545 Low back pain, unspecified: Secondary | ICD-10-CM | POA: Diagnosis not present

## 2022-01-19 DIAGNOSIS — M9905 Segmental and somatic dysfunction of pelvic region: Secondary | ICD-10-CM | POA: Diagnosis not present

## 2022-01-19 DIAGNOSIS — M9904 Segmental and somatic dysfunction of sacral region: Secondary | ICD-10-CM | POA: Diagnosis not present

## 2022-01-19 DIAGNOSIS — M9903 Segmental and somatic dysfunction of lumbar region: Secondary | ICD-10-CM | POA: Diagnosis not present

## 2022-01-19 DIAGNOSIS — M545 Low back pain, unspecified: Secondary | ICD-10-CM | POA: Diagnosis not present

## 2022-01-26 ENCOUNTER — Encounter: Payer: Self-pay | Admitting: Oncology

## 2022-01-26 DIAGNOSIS — M545 Low back pain, unspecified: Secondary | ICD-10-CM | POA: Diagnosis not present

## 2022-01-26 DIAGNOSIS — M9903 Segmental and somatic dysfunction of lumbar region: Secondary | ICD-10-CM | POA: Diagnosis not present

## 2022-01-26 DIAGNOSIS — M9905 Segmental and somatic dysfunction of pelvic region: Secondary | ICD-10-CM | POA: Diagnosis not present

## 2022-01-26 DIAGNOSIS — M9904 Segmental and somatic dysfunction of sacral region: Secondary | ICD-10-CM | POA: Diagnosis not present

## 2022-02-02 ENCOUNTER — Encounter: Payer: Self-pay | Admitting: Internal Medicine

## 2022-02-02 ENCOUNTER — Encounter: Payer: Self-pay | Admitting: Oncology

## 2022-02-02 ENCOUNTER — Ambulatory Visit (INDEPENDENT_AMBULATORY_CARE_PROVIDER_SITE_OTHER): Payer: Medicare HMO | Admitting: Internal Medicine

## 2022-02-02 VITALS — BP 138/98 | HR 110 | Temp 98.6°F | Resp 16 | Ht 66.0 in | Wt 179.6 lb

## 2022-02-02 DIAGNOSIS — R232 Flushing: Secondary | ICD-10-CM | POA: Diagnosis not present

## 2022-02-02 DIAGNOSIS — F331 Major depressive disorder, recurrent, moderate: Secondary | ICD-10-CM

## 2022-02-02 DIAGNOSIS — E559 Vitamin D deficiency, unspecified: Secondary | ICD-10-CM | POA: Diagnosis not present

## 2022-02-02 DIAGNOSIS — R300 Dysuria: Secondary | ICD-10-CM | POA: Diagnosis not present

## 2022-02-02 DIAGNOSIS — I10 Essential (primary) hypertension: Secondary | ICD-10-CM | POA: Diagnosis not present

## 2022-02-02 DIAGNOSIS — R7309 Other abnormal glucose: Secondary | ICD-10-CM

## 2022-02-02 DIAGNOSIS — Z1211 Encounter for screening for malignant neoplasm of colon: Secondary | ICD-10-CM

## 2022-02-02 DIAGNOSIS — Z853 Personal history of malignant neoplasm of breast: Secondary | ICD-10-CM

## 2022-02-02 DIAGNOSIS — Z1159 Encounter for screening for other viral diseases: Secondary | ICD-10-CM

## 2022-02-02 DIAGNOSIS — Z13220 Encounter for screening for lipoid disorders: Secondary | ICD-10-CM | POA: Diagnosis not present

## 2022-02-02 DIAGNOSIS — Z720 Tobacco use: Secondary | ICD-10-CM

## 2022-02-02 LAB — POCT URINALYSIS DIPSTICK
Bilirubin, UA: NEGATIVE
Blood, UA: POSITIVE
Glucose, UA: NEGATIVE
Ketones, UA: NEGATIVE
Leukocytes, UA: NEGATIVE
Nitrite, UA: NEGATIVE
Odor: NORMAL
Protein, UA: POSITIVE — AB
Spec Grav, UA: 1.015 (ref 1.010–1.025)
Urobilinogen, UA: 0.2 E.U./dL
pH, UA: 6.5 (ref 5.0–8.0)

## 2022-02-02 MED ORDER — HYDROCHLOROTHIAZIDE 12.5 MG PO TABS: 12.5000 mg | ORAL_TABLET | Freq: Every day | ORAL | 3 refills | Status: DC

## 2022-02-02 MED ORDER — VENLAFAXINE HCL ER 37.5 MG PO CP24: 37.5000 mg | ORAL_CAPSULE | Freq: Every day | ORAL | 1 refills | Status: DC

## 2022-02-02 MED ORDER — AMLODIPINE BESYLATE 5 MG PO TABS: 5.0000 mg | ORAL_TABLET | Freq: Every day | ORAL | 1 refills | Status: DC

## 2022-02-02 MED ORDER — NICOTINE 7 MG/24HR TD PT24: 7.0000 mg | MEDICATED_PATCH | Freq: Every day | TRANSDERMAL | 0 refills | Status: DC

## 2022-02-02 NOTE — Progress Notes (Signed)
New Patient Office Visit  Subjective    Patient ID: Cindy Brewer, female    DOB: April 28, 1971  Age: 51 y.o. MRN: [id number]  CC:  Chief Complaint  Patient presents with   Establish Care    HPI [name] presents to establish care. Has not had a PCP in the area, does have specialists.   URINARY SYMPTOMS Dysuria: burning Urinary frequency: no Urgency: no Small volume voids: yes Urinary incontinence: no Foul odor: no Hematuria: no Abdominal pain: no Back pain: no Suprapubic pain/pressure: no Flank pain: no No changes in vaginal discharge but does have so  Breast Cancer, multifocal triple negative Stage IIB: -Following with Oncology, Dr. Tasia Catchings. Note and labs reviewed from 12/09/21.  -S/p left lumpectomy and sentinel lymph node biopsy in 2019 -Patient opted out of chemotherapy  History of Iron Deficiency Anemia secondary to uterine fibroids: -Received iron transfusions in the past but since she had polypectomy and myomectomy her hemoglobin has been stable. Last CBC 12/07/21 14.8. Does have ongoing hot flashes since surgery. Had been on [place] but stopped due to history of breast cancer.   Hypertension: -Medications: Amlodipine 2.5, Losartan 100 mg - started these medications about 9 months ago  -Patient is taking the Amlodipine but feels like the Losartan makes her heart race she she only takes it about every other day -Checking BP at home (average): 140/93 -Denies any SOB, CP, vision changes, LE edema or symptoms of hypotension. Does endorse headache the other day.   MDD: -Mood status: exacerbated -Current treatment: Nothing  Psychotherapy/counseling: no  Previous psychiatric medications: zoloft, had side effects of fatigue and sexual dysfunction  Depressed mood: yes Anxious mood: yes Fatigue: yes Suicidal ideations: no    02/02/2022    9:45 AM 02/25/2021   10:05 AM  Depression screen PHQ 2/9  Decreased Interest 3 2  Down, Depressed, Hopeless 3 2  PHQ  - 2 Score 6 4  Altered sleeping 3 3  Tired, decreased energy 3 3  Change in appetite 2 1  Feeling bad or failure about yourself  3 3  Trouble concentrating 3 2  Moving slowly or fidgety/restless 0 0  Suicidal thoughts 0 0  PHQ-9 Score 20 16  Difficult doing work/chores Very difficult    Health Maintenance: -Blood work due -Mammogram scheduled for November -Colon cancer screening due   Outpatient Encounter Medications as of 02/02/2022  Medication Sig   amLODipine (NORVASC) 2.5 MG tablet Take 2.5 mg by mouth daily.   ASHWAGANDHA PO Take by mouth.   Black Cohosh 160 MG CAPS Take by mouth.   Cholecalciferol (VITAMIN D3) 1.25 MG (50000 UT) CAPS Take by mouth.   Cyanocobalamin (VITAMIN B 12 PO) Take by mouth.   latanoprost (XALATAN) 0.005 % ophthalmic solution Apply to eye.   losartan (COZAAR) 100 MG tablet Take 100 mg by mouth daily.   Multiple Vitamin (MULTIVITAMIN WITH MINERALS) TABS tablet Take 1 tablet by mouth daily.   pyridOXINE (VITAMIN B-6) 100 MG tablet Take 100 mg by mouth daily.   Thiamine HCl (VITAMIN B-1) 250 MG tablet Take 250 mg by mouth daily.   vitamin k 100 MCG tablet Take 100 mcg by mouth daily.   [DISCONTINUED] Probiotic Product (PROBIOTIC-10) CHEW Chew by mouth.   Facility-Administered Encounter Medications as of 02/02/2022  Medication   0.9 %  sodium chloride infusion    Past Medical History:  Diagnosis Date   Anemia    Anxiety    Arthritis    right knee  Cancer ([place]) 2019   BREAST- Left   Depression    Fibroid    GERD (gastroesophageal reflux disease)     Past Surgical History:  Procedure Laterality Date   BREAST BIOPSY Left 2019   IDC   BREAST LUMPECTOMY Left 2019   BREAST LUMPECTOMY WITH AXILLARY LYMPH NODE BIOPSY Left 11/24/2017   Procedure: LEFT BREAST LUMPECTOMY WITH LEFT SENTINEL NODE LYMPH NODE BIOPSY;  Surgeon: Jovita Kussmaul, MD;  Location: [facility] ORS;  Service: General;  Laterality: Left;   CESAREAN SECTION     EYE SURGERY     DETACHED  RETINA 4/519   HYSTEROSCOPY WITH D & C N/A 03/14/2019   Procedure: DILATATION AND CURETTAGE [name];  Surgeon: Will Bonnet, MD;  Location: [facility] ORS;  Service: Gynecology;  Laterality: N/A;   IR EMBO TUMOR ORGAN ISCHEMIA INFARCT INC GUIDE ROADMAPPING      Family History  Problem Relation Age of Onset   Kidney disease Mother    Heart disease Mother    Stroke Mother    Hepatitis C Mother    Dementia Father    Blindness Father    Kidney disease Sister    Hypertension Brother    Diabetes Brother    Stroke Maternal Grandmother    Breast cancer Neg Hx     Social History   Socioeconomic History   Marital status: Divorced    Spouse name: Not on file   Number of children: Not on file   Years of education: Not on file   Highest education level: Not on file  Occupational History   Not on file  Tobacco Use   Smoking status: Every Day    Packs/day: 0.25    Years: 13.00    Total pack years: 3.25    Types: Cigarettes   Smokeless tobacco: Never  Vaping Use   Vaping Use: Never used  Substance and Sexual Activity   Alcohol use: Yes    Comment: weekends   Drug use: No   Sexual activity: Not Currently    Birth control/protection: None  Other Topics Concern   Not on file  Social History Narrative   ** Merged History Encounter **       Social Determinants of Health   Financial Resource Strain: Not on file  Food Insecurity: Not on file  Transportation Needs: Not on file  Physical Activity: Insufficiently Active (08/03/2017)   Exercise Vital Sign    Days of Exercise per Week: 7 days    Minutes of Exercise per Session: 20 min  Stress: No Stress Concern Present (08/03/2017)   [place]    Feeling of Stress : Only a little  Social Connections: Socially Isolated (08/03/2017)   Social Connection and Isolation Panel [NHANES]    Frequency of Communication with Friends and Family: Once a week     Frequency of Social Gatherings with Friends and Family: Once a week    Attends Religious Services: Never    Marine scientist or Organizations: No    Attends Archivist Meetings: Never    Marital Status: Divorced  Human resources officer Violence: Not At Risk (08/03/2017)   Humiliation, Afraid, Rape, and Kick questionnaire    Fear of Current or Ex-Partner: No    Emotionally Abused: No    Physically Abused: No    Sexually Abused: No    Review of Systems  Constitutional:  Negative for chills and fever.  Eyes:  Negative for  blurred vision.  Respiratory:  Negative for shortness of breath.   Cardiovascular:  Negative for chest pain.        Objective    BP (!) 138/98   Pulse (!) 110   Temp 98.6 F (37 C)   Resp 16   Ht '5\' 6"'$  (1.676 m)   Wt 179 lb 9.6 oz (81.5 kg)   LMP 03/27/2020   SpO2 98%   BMI 28.99 kg/m   Physical Exam Constitutional:      Appearance: Normal appearance.  HENT:     Head: Normocephalic and atraumatic.     Mouth/Throat:     Mouth: Mucous membranes are moist.     Pharynx: Oropharynx is clear.  Eyes:     Conjunctiva/sclera: Conjunctivae normal.  Cardiovascular:     Rate and Rhythm: Normal rate and regular rhythm.  Pulmonary:     Effort: Pulmonary effort is normal.     Breath sounds: Normal breath sounds.  Musculoskeletal:     Right lower leg: No edema.     Left lower leg: No edema.  Skin:    General: Skin is warm and dry.  Neurological:     General: No focal deficit present.     Mental Status: She is alert. Mental status is at baseline.  Psychiatric:        Mood and Affect: Mood normal.        Behavior: Behavior normal.         Assessment & Plan:   1. Primary hypertension: Side effects of Losartan, this will be discontinued. Increase Amlodipine to 5 mg, start HCTZ 12.5 mg daily. Discussed decreasing sodium in the diet and monitoring blood pressure at home. Recheck CMP today as well. Follow up in 1 month for recheck.  -  amLODipine (NORVASC) 5 MG tablet; Take 1 tablet (5 mg total) by mouth daily.  Dispense: 30 tablet; Refill: 1 - hydrochlorothiazide (HYDRODIURIL) 12.5 MG tablet; Take 1 tablet (12.5 mg total) by mouth daily.  Dispense: 90 tablet; Refill: 3 - COMPLETE METABOLIC PANEL WITH GFR  2. History of breast cancer: Not currently on any therapy, following with Dr. Tasia Catchings, Oncology. Note and labs reviewed from 12/09/21.  3. Hot flashes/Moderate episode of recurrent major depressive disorder [facility]): Start Effexor 37.5 mg daily for both vasomotor symptoms and MDD. Recheck in 1 month.   - venlafaxine XR (EFFEXOR XR) 37.5 MG 24 hr capsule; Take 1 capsule (37.5 mg total) by mouth daily with breakfast.  Dispense: 30 capsule; Refill: 1  4. Dysuria: Also describing vaginal dryness, some blood on POC UA today. Will send for culture. Discussed vaginal lubrication and discussion with gynecologist about vaginal estrogen.    - POCT Urinalysis Dipstick - Urine Culture  5. Elevated glucose: Check A1c with labs.  - HgB A1c  6. Vitamin D deficiency/Lipid screening/Need for hepatitis C screening test: Currently on Vitamin D 5000 IU daily, recheck Vitamin D levels. Screening labs due.   - Vitamin D (25 hydroxy) - Lipid Profile - Hepatitis C Antibody  7. Tobacco use: Interested in tobacco cessation, Nicotine patches sent.   - nicotine (NICODERM CQ - DOSED IN MG/24 HR) 7 mg/24hr patch; Place 1 patch (7 mg total) onto the skin daily.  Dispense: 28 patch; Refill: 0  8. Screening for colon cancer: Referral for screening ordered.   - Ambulatory referral to Gastroenterology  Return in about 4 weeks (around 03/02/2022).   Teodora Medici, DO

## 2022-02-02 NOTE — Patient Instructions (Addendum)
It was great seeing you today!  Plan discussed at today's visit: -Blood work ordered today, results will be uploaded to [place]. Please come back for labs any weekday from 8-12 and 2-4 -For blood pressure, stop Losartan. Increase Amlodipine to 5 mg daily and new medicine, HCTZ added at 12.5 mg daily -Continue to check blood pressure at home -Urine check today -Effexor 37.5 mg added for hot flashes and moods -Nicotine patch sent to pharmacy as well -Referral placed to GI for colon cancer screening  Follow up in: 1 month   Take care and let us know if you have any questions or concerns prior to your next visit.  Dr. Rosana Berger

## 2022-02-03 DIAGNOSIS — R7309 Other abnormal glucose: Secondary | ICD-10-CM | POA: Diagnosis not present

## 2022-02-03 DIAGNOSIS — E559 Vitamin D deficiency, unspecified: Secondary | ICD-10-CM | POA: Diagnosis not present

## 2022-02-03 DIAGNOSIS — Z1159 Encounter for screening for other viral diseases: Secondary | ICD-10-CM | POA: Diagnosis not present

## 2022-02-03 DIAGNOSIS — I10 Essential (primary) hypertension: Secondary | ICD-10-CM | POA: Diagnosis not present

## 2022-02-03 DIAGNOSIS — Z13220 Encounter for screening for lipoid disorders: Secondary | ICD-10-CM | POA: Diagnosis not present

## 2022-02-03 LAB — URINE CULTURE
MICRO NUMBER:: 13727515
SPECIMEN QUALITY:: ADEQUATE

## 2022-02-04 ENCOUNTER — Telehealth: Payer: Self-pay

## 2022-02-04 ENCOUNTER — Other Ambulatory Visit: Payer: Self-pay

## 2022-02-04 DIAGNOSIS — Z1211 Encounter for screening for malignant neoplasm of colon: Secondary | ICD-10-CM

## 2022-02-04 LAB — LIPID PANEL
Cholesterol: 208 mg/dL — ABNORMAL HIGH (ref <200)
HDL: 36 mg/dL — ABNORMAL LOW (ref >=50)
LDL Cholesterol (Calc): 141 mg/dL (calc) — ABNORMAL HIGH
Non-HDL Cholesterol (Calc): 172 mg/dL (calc) — ABNORMAL HIGH (ref <130)
Total CHOL/HDL Ratio: 5.8 (calc) — ABNORMAL HIGH (ref <5.0)
Triglycerides: 173 mg/dL — ABNORMAL HIGH (ref <150)

## 2022-02-04 LAB — HEMOGLOBIN A1C
Hgb A1c MFr Bld: 4.9 % of total Hgb (ref <5.7)
Mean Plasma Glucose: 94 mg/dL
eAG (mmol/L): 5.2 mmol/L

## 2022-02-04 LAB — COMPLETE METABOLIC PANEL WITH GFR
AG Ratio: 1.6 (calc) (ref 1.0–2.5)
ALT: 35 U/L — ABNORMAL HIGH (ref 6–29)
AST: 38 U/L — ABNORMAL HIGH (ref 10–35)
Albumin: 4.7 g/dL (ref 3.6–5.1)
Alkaline phosphatase (APISO): 49 U/L (ref 31–125)
BUN: 12 mg/dL (ref 7–25)
CO2: 26 mmol/L (ref 20–32)
Calcium: 10.1 mg/dL (ref 8.6–10.2)
Chloride: 102 mmol/L (ref 98–110)
Creat: 0.98 mg/dL (ref 0.50–0.99)
Globulin: 3 g/dL (calc) (ref 1.9–3.7)
Glucose, Bld: 103 mg/dL — ABNORMAL HIGH (ref 65–99)
Potassium: 4.2 mmol/L (ref 3.5–5.3)
Sodium: 142 mmol/L (ref 135–146)
Total Bilirubin: 0.6 mg/dL (ref 0.2–1.2)
Total Protein: 7.7 g/dL (ref 6.1–8.1)
eGFR: 71 mL/min/{1.73_m2} (ref >=60)

## 2022-02-04 LAB — HEPATITIS C ANTIBODY: Hepatitis C Ab: NONREACTIVE

## 2022-02-04 LAB — VITAMIN D 25 HYDROXY (VIT D DEFICIENCY, FRACTURES): Vit D, 25-Hydroxy: 20 ng/mL — ABNORMAL LOW (ref 30–100)

## 2022-02-04 MED ORDER — CLENPIQ 10-3.5-12 MG-GM -GM/160ML PO SOLN
1.0000 | Freq: Once | ORAL | 0 refills | Status: AC
Start: 2022-02-04 — End: 2022-02-04

## 2022-02-04 NOTE — Telephone Encounter (Signed)
Gastroenterology Pre-Procedure Review  Request Date: 03/15/22 Requesting Physician: Dr. Vicente Males  PATIENT REVIEW QUESTIONS: The patient responded to the following health history questions as indicated:    1. Are you having any GI issues? no 2. Do you have a personal history of Polyps? no 3. Do you have a family history of Colon Cancer or Polyps? no 4. Diabetes Mellitus? no 5. Joint replacements in the past 12 months?no 6. Major health problems in the past 3 months?no 7. Any artificial heart valves, MVP, or defibrillator?no    MEDICATIONS & ALLERGIES:    Patient reports the following regarding taking any anticoagulation/antiplatelet therapy:   Plavix, Coumadin, Eliquis, Xarelto, Lovenox, Pradaxa, Brilinta, or Effient? no Aspirin? no  Patient confirms/reports the following medications:  Current Outpatient Medications  Medication Sig Dispense Refill   amLODipine (NORVASC) 5 MG tablet Take 1 tablet (5 mg total) by mouth daily. 30 tablet 1   ASHWAGANDHA PO Take by mouth.     Black Cohosh 160 MG CAPS Take by mouth.     Cholecalciferol (VITAMIN D3) 1.25 MG (50000 UT) CAPS Take by mouth.     Cyanocobalamin (VITAMIN B 12 PO) Take by mouth.     hydrochlorothiazide (HYDRODIURIL) 12.5 MG tablet Take 1 tablet (12.5 mg total) by mouth daily. 90 tablet 3   latanoprost (XALATAN) 0.005 % ophthalmic solution Apply to eye.     Multiple Vitamin (MULTIVITAMIN WITH MINERALS) TABS tablet Take 1 tablet by mouth daily.     nicotine (NICODERM CQ - DOSED IN MG/24 HR) 7 mg/24hr patch Place 1 patch (7 mg total) onto the skin daily. 28 patch 0   pyridOXINE (VITAMIN B-6) 100 MG tablet Take 100 mg by mouth daily.     Thiamine HCl (VITAMIN B-1) 250 MG tablet Take 250 mg by mouth daily.     venlafaxine XR (EFFEXOR XR) 37.5 MG 24 hr capsule Take 1 capsule (37.5 mg total) by mouth daily with breakfast. 30 capsule 1   No current facility-administered medications for this visit.   Facility-Administered Medications  Ordered in Other Visits  Medication Dose Route Frequency Provider Last Rate Last Admin   0.9 %  sodium chloride infusion   Intravenous Continuous Earlie Server, MD 10 mL/hr at 01/11/19 1412 New Bag at 01/11/19 1412    Patient confirms/reports the following allergies:  Allergies  Allergen Reactions   Demerol [Meperidine] Anaphylaxis    Pt does not tolerate pain killers.   Oxycodone Anaphylaxis   Sulfa Antibiotics Anaphylaxis   Percocet [Oxycodone-Acetaminophen] Swelling    No orders of the defined types were placed in this encounter.   AUTHORIZATION INFORMATION Primary Insurance: 1D#: Group #:  Secondary Insurance: 1D#: Group #:  SCHEDULE INFORMATION: Date: 03/15/22 Time: Location: [facility]

## 2022-02-07 DIAGNOSIS — M545 Low back pain, unspecified: Secondary | ICD-10-CM | POA: Diagnosis not present

## 2022-02-07 DIAGNOSIS — M9904 Segmental and somatic dysfunction of sacral region: Secondary | ICD-10-CM | POA: Diagnosis not present

## 2022-02-07 DIAGNOSIS — M9903 Segmental and somatic dysfunction of lumbar region: Secondary | ICD-10-CM | POA: Diagnosis not present

## 2022-02-07 DIAGNOSIS — M9905 Segmental and somatic dysfunction of pelvic region: Secondary | ICD-10-CM | POA: Diagnosis not present

## 2022-02-08 MED ORDER — VITAMIN D (ERGOCALCIFEROL) 1.25 MG (50000 UNIT) PO CAPS: 50000.0000 [IU] | ORAL_CAPSULE | ORAL | 0 refills | Status: DC

## 2022-02-08 NOTE — Addendum Note (Signed)
Addended by: Teodora Medici on: 02/08/2022 11:30 AM   Modules accepted: Orders

## 2022-02-16 DIAGNOSIS — E782 Mixed hyperlipidemia: Secondary | ICD-10-CM | POA: Diagnosis not present

## 2022-02-16 DIAGNOSIS — I10 Essential (primary) hypertension: Secondary | ICD-10-CM | POA: Diagnosis not present

## 2022-02-16 DIAGNOSIS — R002 Palpitations: Secondary | ICD-10-CM | POA: Diagnosis not present

## 2022-02-24 ENCOUNTER — Other Ambulatory Visit: Payer: Self-pay | Admitting: Internal Medicine

## 2022-02-24 DIAGNOSIS — I10 Essential (primary) hypertension: Secondary | ICD-10-CM

## 2022-02-24 DIAGNOSIS — F331 Major depressive disorder, recurrent, moderate: Secondary | ICD-10-CM

## 2022-02-24 DIAGNOSIS — R232 Flushing: Secondary | ICD-10-CM

## 2022-02-24 NOTE — Telephone Encounter (Signed)
Requested Prescriptions  Pending Prescriptions Disp Refills  . amLODipine (NORVASC) 5 MG tablet [Pharmacy Med Name: AMLODIPINE BESYLATE 5 MG TAB] 90 tablet 0    Sig: TAKE 1 TABLET (5 MG TOTAL) BY MOUTH DAILY.     Cardiovascular: Calcium Channel Blockers 2 Failed - 02/24/2022  8:32 AM      Failed - Last BP in normal range    BP Readings from Last 1 Encounters:  02/02/22 (!) 138/98         Passed - Last Heart Rate in normal range    Pulse Readings from Last 1 Encounters:  02/02/22 (!) 110         Passed - Valid encounter within last 6 months    Recent Outpatient Visits          3 weeks ago Primary hypertension   [facility] Teodora Medici, DO      Future Appointments            In 6 days Reece Packer, Myna Hidalgo, [facility], [facility]

## 2022-02-24 NOTE — Telephone Encounter (Signed)
Requested medication (s) are due for refill today: No  Requested medication (s) are on the active medication list: Yes  Last refill:  02/02/22  Future visit scheduled: Yes  Notes to clinic:  Pharmacy request 90 day supply and diagnosis code.    Requested Prescriptions  Pending Prescriptions Disp Refills   venlafaxine XR (EFFEXOR-XR) 37.5 MG 24 hr capsule [Pharmacy Med Name: VENLAFAXINE HCL ER 37.5 MG CAP] 90 capsule 1    Sig: TAKE 1 CAPSULE BY MOUTH DAILY WITH BREAKFAST.     Psychiatry: Antidepressants - SNRI - desvenlafaxine & venlafaxine Failed - 02/24/2022  8:34 AM      Failed - Last BP in normal range    BP Readings from Last 1 Encounters:  02/02/22 (!) 138/98         Failed - Lipid Panel in normal range within the last 12 months    Cholesterol  Date Value Ref Range Status  02/03/2022 208 (H) <200 mg/dL Final   LDL Cholesterol (Calc)  Date Value Ref Range Status  02/03/2022 141 (H) mg/dL (calc) Final    Comment:    Reference range: <100 . Desirable range <100 mg/dL for primary prevention;   <70 mg/dL for patients with CHD or diabetic patients  with > or = 2 CHD risk factors. [name] LDL-C is now calculated using the Martin-Hopkins  calculation, which is a validated novel method providing  better accuracy than the Friedewald equation in the  estimation of LDL-C.  Cresenciano Genre et al. Annamaria Helling. 8938;101(75): 2061-2068  (http://education.[employer].com/faq/FAQ164)    HDL  Date Value Ref Range Status  02/03/2022 36 (L) > OR = 50 mg/dL Final   Triglycerides  Date Value Ref Range Status  02/03/2022 173 (H) <150 mg/dL Final         Passed - Cr in normal range and within 360 days    Creat  Date Value Ref Range Status  02/03/2022 0.98 0.50 - 0.99 mg/dL Final         Passed - Completed PHQ-2 or PHQ-9 in the last 360 days      Passed - Valid encounter within last 6 months    Recent Outpatient Visits           3 weeks ago Primary hypertension   [facility] Teodora Medici, DO       Future Appointments             In 6 days Reece Packer, Myna Hidalgo, [facility], [facility]

## 2022-03-02 ENCOUNTER — Ambulatory Visit: Payer: Medicare HMO | Admitting: Nurse Practitioner

## 2022-03-03 ENCOUNTER — Ambulatory Visit (INDEPENDENT_AMBULATORY_CARE_PROVIDER_SITE_OTHER): Payer: Medicare HMO | Admitting: Nurse Practitioner

## 2022-03-03 ENCOUNTER — Encounter: Payer: Self-pay | Admitting: Nurse Practitioner

## 2022-03-03 VITALS — BP 144/82 | HR 87 | Temp 98.2°F | Resp 16 | Ht 66.0 in | Wt 179.5 lb

## 2022-03-03 DIAGNOSIS — F331 Major depressive disorder, recurrent, moderate: Secondary | ICD-10-CM

## 2022-03-03 DIAGNOSIS — K219 Gastro-esophageal reflux disease without esophagitis: Secondary | ICD-10-CM

## 2022-03-03 DIAGNOSIS — R232 Flushing: Secondary | ICD-10-CM

## 2022-03-03 DIAGNOSIS — I10 Essential (primary) hypertension: Secondary | ICD-10-CM | POA: Diagnosis not present

## 2022-03-03 MED ORDER — OMEPRAZOLE 20 MG PO CPDR: 20.0000 mg | DELAYED_RELEASE_CAPSULE | Freq: Every day | ORAL | 3 refills | Status: DC

## 2022-03-03 MED ORDER — HYDROCHLOROTHIAZIDE 25 MG PO TABS: 25.0000 mg | ORAL_TABLET | Freq: Every day | ORAL | 0 refills | Status: DC

## 2022-03-03 NOTE — Assessment & Plan Note (Signed)
Continue taking amlodipine 5 mg daily, increase hydrochlorothiazide to 25 mg daily.  Keep a log of your blood pressure and bring it to your next appointment in 4 weeks.

## 2022-03-03 NOTE — Progress Notes (Signed)
BP (!) 144/82   Pulse 87   Temp 98.2 F (36.8 C) (Oral)   Resp 16   Ht _0  (1.676 m)   Wt 179 lb 8 oz (81.4 kg)   LMP 03/27/2020   SpO2 94%   BMI 28.97 kg/m    Subjective:    Patient ID: Cindy Brewer, female    DOB: 12/29/1970, 51 y.o.   MRN: [id number]  HPI: Cindy Brewer is a 51 y.o. female  Chief Complaint  Patient presents with   Follow-up   Hypertension    Pt has not been consistent with the medication for the past 3 days   HTN: She is prescribed hydrochlorothiazide 12.5 mg daily and amlodipine 5 mg daily.  She says for the last few days she has not been consistent with her medication.  Her blood pressure today is 150/88, recheck was 144/82.  She says she has not been checking her blood pressure but feels like her blood pressure has been running high at home.  Will increase hydrochlorothiazide to 25 mg. Patient to follow up in 4 weeks.   Hot flashes/depression: Last appointment she was started on Effexor 37.5 mg daily for Hot flashes and depression. She says the effexor has helped with the hot flashes.  Patient's PHQ9 and GAD scores are positive.  She says that her mood is better.  She says the medication is helping.      03/03/2022   12:52 PM 02/02/2022    9:45 AM 02/25/2021   10:05 AM  Depression screen PHQ 2/9  Decreased Interest _1 Down, Depressed, Hopeless _2 PHQ - 2 Score _3 Altered sleeping _4 Tired, decreased energy _5 Change in appetite _6 Feeling bad or failure about yourself  _7 Trouble concentrating _8 Moving slowly or fidgety/restless 3 0 0  Suicidal thoughts 0 0 0  PHQ-9 Score _9 Difficult doing work/chores Somewhat difficult Very difficult        03/03/2022   12:53 PM 02/02/2022    9:46 AM 02/25/2021   10:05 AM  GAD 7 : Generalized Anxiety Score  Nervous, Anxious, on Edge _10 Control/stop worrying 1 2 0  Worry too much - different things _11 Trouble relaxing _12 Restless _13 Easily annoyed or irritable _14 Afraid - awful might happen _15 Total GAD 7 Score _16 Anxiety Difficulty Somewhat difficult Somewhat difficult    GERD:  She says that she has been having an increase in acid reflux.  She says she has been taking over the counter acid reducer tagamet as needed.  She says that it is worse at night. Patient reports she has had issues with this for years. She says she used to see a GI in [place] but has not seen one in years.  Will add omeprazole and placed referral to gi.   Relevant past medical, surgical, family and social history reviewed and updated as indicated. Interim medical history since our last visit reviewed. Allergies and medications reviewed and updated.  Review of Systems  Constitutional: Negative for fever or weight change.  Respiratory: Negative for cough and shortness of breath.   Cardiovascular: Negative for chest pain or palpitations.  Gastrointestinal: Negative for abdominal pain, no bowel changes.  Musculoskeletal: Negative for gait  problem or joint swelling.  Skin: Negative for rash.  Neurological: Negative for dizziness or headache.  No other specific complaints in a complete review of systems (except as listed in HPI above).      Objective:    BP (!) 144/82   Pulse 87   Temp 98.2 F (36.8 C) (Oral)   Resp 16   Ht _0  (1.676 m)   Wt 179 lb 8 oz (81.4 kg)   LMP 03/27/2020   SpO2 94%   BMI 28.97 kg/m   Wt Readings from Last 3 Encounters:  03/03/22 179 lb 8 oz (81.4 kg)  02/02/22 179 lb 9.6 oz (81.5 kg)  12/09/21 181 lb (82.1 kg)    Physical Exam  Constitutional: Patient appears well-developed and well-nourished. Obese  No distress.  HEENT: head atraumatic, normocephalic, pupils equal and reactive to light, neck supple Cardiovascular: Normal rate, regular rhythm and normal heart sounds.  No murmur heard. No BLE edema. Pulmonary/Chest: Effort normal and breath sounds normal. No respiratory  distress. Abdominal: Soft.  There is no tenderness. Psychiatric: Patient has a normal mood and affect. behavior is normal. Judgment and thought content normal.  Results for orders placed or performed in visit on 02/02/22  Urine Culture   Specimen: Urine  Result Value Ref Range   MICRO NUMBER: [id number]    SPECIMEN QUALITY: Adequate    Sample Source URINE    STATUS: FINAL    Result:      Less than 10,000 CFU/mL of single Gram negative organism isolated. No further testing will be performed. If clinically indicated, recollection using a method to minimize contamination, with prompt transfer to Urine Culture Transport Tube, is recommended.  COMPLETE METABOLIC PANEL WITH GFR  Result Value Ref Range   Glucose, Bld 103 (H) 65 - 99 mg/dL   BUN 12 7 - 25 mg/dL   Creat 0.98 0.50 - 0.99 mg/dL   eGFR 71 > OR = 60 mL/min/1.39m   BUN/Creatinine Ratio SEE NOTE: 6 - 22 (calc)   Sodium 142 135 - 146 mmol/L   Potassium 4.2 3.5 - 5.3 mmol/L   Chloride 102 98 - 110 mmol/L   CO2 26 20 - 32 mmol/L   Calcium 10.1 8.6 - 10.2 mg/dL   Total Protein 7.7 6.1 - 8.1 g/dL   Albumin 4.7 3.6 - 5.1 g/dL   Globulin 3.0 1.9 - 3.7 g/dL (calc)   AG Ratio 1.6 1.0 - 2.5 (calc)   Total Bilirubin 0.6 0.2 - 1.2 mg/dL   Alkaline phosphatase (APISO) 49 31 - 125 U/L   AST 38 (H) 10 - 35 U/L   ALT 35 (H) 6 - 29 U/L  HgB A1c  Result Value Ref Range   Hgb A1c MFr Bld 4.9 <5.7 % of total Hgb   Mean Plasma Glucose 94 mg/dL   eAG (mmol/L) 5.2 mmol/L  Lipid Profile  Result Value Ref Range   Cholesterol 208 (H) <200 mg/dL   HDL 36 (L) > OR = 50 mg/dL   Triglycerides 173 (H) <150 mg/dL   LDL Cholesterol (Calc) 141 (H) mg/dL (calc)   Total CHOL/HDL Ratio 5.8 (H) <5.0 (calc)   Non-HDL Cholesterol (Calc) 172 (H) <130 mg/dL (calc)  Vitamin D (25 hydroxy)  Result Value Ref Range   Vit D, 25-Hydroxy 20 (L) 30 - 100 ng/mL  Hepatitis C antibody  Result Value Ref Range   Hepatitis C Ab NON-REACTIVE NON-REACTIVE  POCT  Urinalysis Dipstick  Result Value Ref Range   Color,  UA yellow    Clarity, UA clear    Glucose, UA Negative Negative   Bilirubin, UA neg    Ketones, UA neg    Spec Grav, UA 1.015 1.010 - 1.025   Blood, UA positive    pH, UA 6.5 5.0 - 8.0   Protein, UA Positive (A) Negative   Urobilinogen, UA 0.2 0.2 or 1.0 E.U./dL   Nitrite, UA neg    Leukocytes, UA Negative Negative   Appearance dark yellow    Odor normal       Assessment & Plan:   Problem List Items Addressed This Visit       Cardiovascular and Mediastinum   Primary hypertension - Primary    Continue taking amlodipine 5 mg daily, increase hydrochlorothiazide to 25 mg daily.  Keep a log of your blood pressure and bring it to your next appointment in 4 weeks.       Relevant Medications   hydrochlorothiazide (HYDRODIURIL) 25 MG tablet   Other Relevant Orders   COMPLETE METABOLIC PANEL WITH GFR   Hot flashes    Hot flashes have improved, continue taking Effexor 37.5 mg daily.       Relevant Medications   hydrochlorothiazide (HYDRODIURIL) 25 MG tablet     Digestive   Gastroesophageal reflux disease without esophagitis    Continue taking Tagamet, start taking omeprazole and referral placed to gi       Relevant Medications   omeprazole (PRILOSEC) 20 MG capsule   Other Relevant Orders   Ambulatory referral to Gastroenterology     Other   Moderate episode of recurrent major depressive disorder ([place])    Patient reports that her mood is better, her PHQ9 and GAD scores are positive.  Continue taking Effexor 37.5 mg daily.         Follow up plan: Return in about 4 weeks (around 03/31/2022) for follow up, with Dr. Rosana Berger.

## 2022-03-03 NOTE — Assessment & Plan Note (Signed)
Continue taking Tagamet, start taking omeprazole and referral placed to gi

## 2022-03-03 NOTE — Assessment & Plan Note (Signed)
Hot flashes have improved, continue taking Effexor 37.5 mg daily.

## 2022-03-03 NOTE — Assessment & Plan Note (Signed)
Patient reports that her mood is better, her PHQ9 and GAD scores are positive.  Continue taking Effexor 37.5 mg daily.

## 2022-03-04 LAB — COMPLETE METABOLIC PANEL WITH GFR
AG Ratio: 1.6 (calc) (ref 1.0–2.5)
ALT: 32 U/L — ABNORMAL HIGH (ref 6–29)
AST: 32 U/L (ref 10–35)
Albumin: 4.3 g/dL (ref 3.6–5.1)
Alkaline phosphatase (APISO): 47 U/L (ref 31–125)
BUN: 15 mg/dL (ref 7–25)
CO2: 26 mmol/L (ref 20–32)
Calcium: 9.9 mg/dL (ref 8.6–10.2)
Chloride: 109 mmol/L (ref 98–110)
Creat: 0.8 mg/dL (ref 0.50–0.99)
Globulin: 2.7 g/dL (calc) (ref 1.9–3.7)
Glucose, Bld: 102 mg/dL — ABNORMAL HIGH (ref 65–99)
Potassium: 5 mmol/L (ref 3.5–5.3)
Sodium: 144 mmol/L (ref 135–146)
Total Bilirubin: 0.4 mg/dL (ref 0.2–1.2)
Total Protein: 7 g/dL (ref 6.1–8.1)
eGFR: 90 mL/min/{1.73_m2} (ref >=60)

## 2022-03-14 ENCOUNTER — Other Ambulatory Visit: Payer: Self-pay

## 2022-03-14 ENCOUNTER — Telehealth: Payer: Self-pay | Admitting: Gastroenterology

## 2022-03-14 DIAGNOSIS — Z1211 Encounter for screening for malignant neoplasm of colon: Secondary | ICD-10-CM

## 2022-03-14 MED ORDER — CLENPIQ 10-3.5-12 MG-GM -GM/160ML PO SOLN: 1.0000 | Freq: Once | ORAL | 0 refills | Status: AC

## 2022-03-14 MED ORDER — CLENPIQ 10-3.5-12 MG-GM -GM/160ML PO SOLN: 1.0000 | Freq: Once | ORAL | 0 refills | Status: DC

## 2022-03-14 NOTE — Telephone Encounter (Signed)
Patient was contacted and she rescheduled her procedure to 04/05/2022.

## 2022-03-14 NOTE — Addendum Note (Signed)
Addended by: Wayna Chalet on: 03/14/2022 04:32 PM   Modules accepted: Orders

## 2022-03-14 NOTE — Telephone Encounter (Signed)
Patient left vm to cancel and reschedule colonoscopy. Requesting call back.

## 2022-03-15 ENCOUNTER — Ambulatory Visit: Admission: RE | Admit: 2022-03-15 | Payer: Medicare HMO | Source: Ambulatory Visit | Admitting: Gastroenterology

## 2022-03-15 ENCOUNTER — Encounter: Admission: RE | Payer: Self-pay | Source: Ambulatory Visit

## 2022-03-15 SURGERY — COLONOSCOPY WITH PROPOFOL
Anesthesia: General

## 2022-03-21 DIAGNOSIS — M545 Low back pain, unspecified: Secondary | ICD-10-CM | POA: Diagnosis not present

## 2022-03-21 DIAGNOSIS — M9903 Segmental and somatic dysfunction of lumbar region: Secondary | ICD-10-CM | POA: Diagnosis not present

## 2022-03-21 DIAGNOSIS — M9905 Segmental and somatic dysfunction of pelvic region: Secondary | ICD-10-CM | POA: Diagnosis not present

## 2022-03-21 DIAGNOSIS — M9904 Segmental and somatic dysfunction of sacral region: Secondary | ICD-10-CM | POA: Diagnosis not present

## 2022-04-05 ENCOUNTER — Ambulatory Visit: Admission: RE | Admit: 2022-04-05 | Payer: Medicare HMO | Source: Home / Self Care | Admitting: Gastroenterology

## 2022-04-05 ENCOUNTER — Encounter: Admission: RE | Payer: Self-pay | Source: Home / Self Care

## 2022-04-05 SURGERY — COLONOSCOPY WITH PROPOFOL
Anesthesia: General

## 2022-04-07 ENCOUNTER — Ambulatory Visit: Payer: Medicare HMO | Admitting: Internal Medicine

## 2022-04-07 NOTE — Progress Notes (Deleted)
Established Patient Office Visit  Subjective    Patient ID: Cindy Brewer, female    DOB: April 24, 1971  Age: 51 y.o. MRN: [id number]  CC:  No chief complaint on file.   HPI Cindy Brewer presents for follow up.   Breast Cancer, multifocal triple negative Stage IIB: -Following with Oncology, Dr. Tasia Catchings. Note and labs reviewed from 12/09/21.  -S/p left lumpectomy and sentinel lymph node biopsy in 2019 -Patient opted out of chemotherapy  History of Iron Deficiency Anemia secondary to uterine fibroids: -Received iron transfusions in the past but since she had polypectomy and myomectomy her hemoglobin has been stable. Last CBC 12/07/21 14.8. Does have ongoing hot flashes since surgery. Had been on [place] but stopped due to history of breast cancer.   Hypertension: -Medications: Amlodipine recently increased from 2.5 to 5 mg, Losartan 100 mg discontinued due to side effects. Started on HCTZ 12.5 mg which was then increased to 25 mg about 1 month ago. -Checking BP at home (average): 140/93 -Denies any SOB, CP, vision changes, LE edema or symptoms of hypotension. Does endorse headache the other day.   MDD: -Mood status: exacerbated -Current treatment: Started on Effexor 37.5 mg Psychotherapy/counseling: no  Previous psychiatric medications: zoloft, had side effects of fatigue and sexual dysfunction  Depressed mood: yes Anxious mood: yes Fatigue: yes Suicidal ideations: no    03/03/2022   12:52 PM 02/02/2022    9:45 AM 02/25/2021   10:05 AM  Depression screen PHQ 2/9  Decreased Interest '3 3 2  '$ Down, Depressed, Hopeless '3 3 2  '$ PHQ - 2 Score '6 6 4  '$ Altered sleeping '3 3 3  '$ Tired, decreased energy '3 3 3  '$ Change in appetite '1 2 1  '$ Feeling bad or failure about yourself  '1 3 3  '$ Trouble concentrating '3 3 2  '$ Moving slowly or fidgety/restless 3 0 0  Suicidal thoughts 0 0 0  PHQ-9 Score '20 20 16  '$ Difficult doing work/chores Somewhat difficult Very difficult    GERD: -Currently  on Prilosec 20 mg  Health Maintenance: -Blood work UTD -Mammogram scheduled for November -Colon cancer screening due   Outpatient Encounter Medications as of 04/07/2022  Medication Sig   amLODipine (NORVASC) 5 MG tablet TAKE 1 TABLET (5 MG TOTAL) BY MOUTH DAILY.   ASHWAGANDHA PO Take by mouth.   Black Cohosh 160 MG CAPS Take by mouth.   Cyanocobalamin (VITAMIN B 12 PO) Take by mouth.   hydrochlorothiazide (HYDRODIURIL) 25 MG tablet Take 1 tablet (25 mg total) by mouth daily.   latanoprost (XALATAN) 0.005 % ophthalmic solution Apply to eye.   Multiple Vitamin (MULTIVITAMIN WITH MINERALS) TABS tablet Take 1 tablet by mouth daily.   nicotine (NICODERM CQ - DOSED IN MG/24 HR) 7 mg/24hr patch Place 1 patch (7 mg total) onto the skin daily.   omeprazole (PRILOSEC) 20 MG capsule Take 1 capsule (20 mg total) by mouth daily.   pyridOXINE (VITAMIN B-6) 100 MG tablet Take 100 mg by mouth daily.   Thiamine HCl (VITAMIN B-1) 250 MG tablet Take 250 mg by mouth daily.   venlafaxine XR (EFFEXOR-XR) 37.5 MG 24 hr capsule TAKE 1 CAPSULE BY MOUTH DAILY WITH BREAKFAST.   Vitamin D, Ergocalciferol, (DRISDOL) 1.25 MG (50000 UNIT) CAPS capsule Take 1 capsule (50,000 Units total) by mouth every 7 (seven) days.   Facility-Administered Encounter Medications as of 04/07/2022  Medication   0.9 %  sodium chloride infusion    Past Medical History:  Diagnosis Date   Anemia  Anxiety    Arthritis    right knee    Cancer ([place]) 2019   BREAST- Left   Depression    Fibroid    GERD (gastroesophageal reflux disease)     Past Surgical History:  Procedure Laterality Date   BREAST BIOPSY Left 2019   IDC   BREAST LUMPECTOMY Left 2019   BREAST LUMPECTOMY WITH AXILLARY LYMPH NODE BIOPSY Left 11/24/2017   Procedure: LEFT BREAST LUMPECTOMY WITH LEFT SENTINEL NODE LYMPH NODE BIOPSY;  Surgeon: Jovita Kussmaul, MD;  Location: [facility] ORS;  Service: General;  Laterality: Left;   CESAREAN SECTION     EYE SURGERY      DETACHED RETINA 4/519   HYSTEROSCOPY WITH D & C N/A 03/14/2019   Procedure: DILATATION AND CURETTAGE [name];  Surgeon: Will Bonnet, MD;  Location: [facility] ORS;  Service: Gynecology;  Laterality: N/A;   IR EMBO TUMOR ORGAN ISCHEMIA INFARCT INC GUIDE ROADMAPPING      Family History  Problem Relation Age of Onset   Kidney disease Mother    Heart disease Mother    Stroke Mother    Hepatitis C Mother    Dementia Father    Blindness Father    Kidney disease Sister    Hypertension Brother    Diabetes Brother    Stroke Maternal Grandmother    Breast cancer Neg Hx     Social History   Socioeconomic History   Marital status: Divorced    Spouse name: Not on file   Number of children: Not on file   Years of education: Not on file   Highest education level: Not on file  Occupational History   Not on file  Tobacco Use   Smoking status: Every Day    Packs/day: 0.25    Years: 13.00    Total pack years: 3.25    Types: Cigarettes   Smokeless tobacco: Never  Vaping Use   Vaping Use: Never used  Substance and Sexual Activity   Alcohol use: Yes    Comment: weekends   Drug use: No   Sexual activity: Not Currently    Birth control/protection: None  Other Topics Concern   Not on file  Social History Narrative   ** Merged History Encounter **       Social Determinants of Health   Financial Resource Strain: Not on file  Food Insecurity: Not on file  Transportation Needs: Not on file  Physical Activity: Insufficiently Active (08/03/2017)   Exercise Vital Sign    Days of Exercise per Week: 7 days    Minutes of Exercise per Session: 20 min  Stress: No Stress Concern Present (08/03/2017)   [place]    Feeling of Stress : Only a little  Social Connections: Socially Isolated (08/03/2017)   Social Connection and Isolation Panel [NHANES]    Frequency of Communication with Friends and Family: Once a  week    Frequency of Social Gatherings with Friends and Family: Once a week    Attends Religious Services: Never    Marine scientist or Organizations: No    Attends Archivist Meetings: Never    Marital Status: Divorced  Human resources officer Violence: Not At Risk (08/03/2017)   Humiliation, Afraid, Rape, and Kick questionnaire    Fear of Current or Ex-Partner: No    Emotionally Abused: No    Physically Abused: No    Sexually Abused: No    Review of Systems  Constitutional:  Negative for chills and fever.  Eyes:  Negative for blurred vision.  Respiratory:  Negative for shortness of breath.   Cardiovascular:  Negative for chest pain.        Objective    LMP 03/27/2020   Physical Exam Constitutional:      Appearance: Normal appearance.  HENT:     Head: Normocephalic and atraumatic.     Mouth/Throat:     Mouth: Mucous membranes are moist.     Pharynx: Oropharynx is clear.  Eyes:     Conjunctiva/sclera: Conjunctivae normal.  Cardiovascular:     Rate and Rhythm: Normal rate and regular rhythm.  Pulmonary:     Effort: Pulmonary effort is normal.     Breath sounds: Normal breath sounds.  Musculoskeletal:     Right lower leg: No edema.     Left lower leg: No edema.  Skin:    General: Skin is warm and dry.  Neurological:     General: No focal deficit present.     Mental Status: She is alert. Mental status is at baseline.  Psychiatric:        Mood and Affect: Mood normal.        Behavior: Behavior normal.         Assessment & Plan:   1. Primary hypertension: Side effects of Losartan, this will be discontinued. Increase Amlodipine to 5 mg, start HCTZ 12.5 mg daily. Discussed decreasing sodium in the diet and monitoring blood pressure at home. Recheck CMP today as well. Follow up in 1 month for recheck.  - amLODipine (NORVASC) 5 MG tablet; Take 1 tablet (5 mg total) by mouth daily.  Dispense: 30 tablet; Refill: 1 - hydrochlorothiazide (HYDRODIURIL) 12.5  MG tablet; Take 1 tablet (12.5 mg total) by mouth daily.  Dispense: 90 tablet; Refill: 3 - COMPLETE METABOLIC PANEL WITH GFR  2. History of breast cancer: Not currently on any therapy, following with Dr. Tasia Catchings, Oncology. Note and labs reviewed from 12/09/21.  3. Hot flashes/Moderate episode of recurrent major depressive disorder [facility]): Start Effexor 37.5 mg daily for both vasomotor symptoms and MDD. Recheck in 1 month.   - venlafaxine XR (EFFEXOR XR) 37.5 MG 24 hr capsule; Take 1 capsule (37.5 mg total) by mouth daily with breakfast.  Dispense: 30 capsule; Refill: 1  4. Dysuria: Also describing vaginal dryness, some blood on POC UA today. Will send for culture. Discussed vaginal lubrication and discussion with gynecologist about vaginal estrogen.    - POCT Urinalysis Dipstick - Urine Culture  5. Elevated glucose: Check A1c with labs.  - HgB A1c  6. Vitamin D deficiency/Lipid screening/Need for hepatitis C screening test: Currently on Vitamin D 5000 IU daily, recheck Vitamin D levels. Screening labs due.   - Vitamin D (25 hydroxy) - Lipid Profile - Hepatitis C Antibody  7. Tobacco use: Interested in tobacco cessation, Nicotine patches sent.   - nicotine (NICODERM CQ - DOSED IN MG/24 HR) 7 mg/24hr patch; Place 1 patch (7 mg total) onto the skin daily.  Dispense: 28 patch; Refill: 0  8. Screening for colon cancer: Referral for screening ordered.   - Ambulatory referral to Gastroenterology  No follow-ups on file.   Teodora Medici, DO

## 2022-04-11 DIAGNOSIS — M9904 Segmental and somatic dysfunction of sacral region: Secondary | ICD-10-CM | POA: Diagnosis not present

## 2022-04-11 DIAGNOSIS — M9903 Segmental and somatic dysfunction of lumbar region: Secondary | ICD-10-CM | POA: Diagnosis not present

## 2022-04-11 DIAGNOSIS — M5432 Sciatica, left side: Secondary | ICD-10-CM | POA: Diagnosis not present

## 2022-04-11 DIAGNOSIS — M9905 Segmental and somatic dysfunction of pelvic region: Secondary | ICD-10-CM | POA: Diagnosis not present

## 2022-05-02 DIAGNOSIS — R69 Illness, unspecified: Secondary | ICD-10-CM | POA: Diagnosis not present

## 2022-05-19 ENCOUNTER — Other Ambulatory Visit: Payer: Self-pay

## 2022-05-19 ENCOUNTER — Ambulatory Visit (INDEPENDENT_AMBULATORY_CARE_PROVIDER_SITE_OTHER): Payer: Medicare HMO | Admitting: Internal Medicine

## 2022-05-19 ENCOUNTER — Encounter: Payer: Self-pay | Admitting: Internal Medicine

## 2022-05-19 VITALS — BP 132/80 | HR 95 | Temp 98.9°F | Resp 16 | Ht 66.0 in | Wt 176.1 lb

## 2022-05-19 DIAGNOSIS — F331 Major depressive disorder, recurrent, moderate: Secondary | ICD-10-CM | POA: Diagnosis not present

## 2022-05-19 DIAGNOSIS — L723 Sebaceous cyst: Secondary | ICD-10-CM | POA: Diagnosis not present

## 2022-05-19 DIAGNOSIS — I10 Essential (primary) hypertension: Secondary | ICD-10-CM | POA: Diagnosis not present

## 2022-05-19 MED ORDER — HYDROCHLOROTHIAZIDE 25 MG PO TABS: 25.0000 mg | ORAL_TABLET | Freq: Every day | ORAL | 1 refills | Status: DC

## 2022-05-19 MED ORDER — ESCITALOPRAM OXALATE 5 MG PO TABS: 5.0000 mg | ORAL_TABLET | Freq: Every day | ORAL | 1 refills | Status: DC

## 2022-05-19 MED ORDER — AMLODIPINE BESYLATE 5 MG PO TABS: 5.0000 mg | ORAL_TABLET | Freq: Every day | ORAL | 1 refills | Status: DC

## 2022-05-19 NOTE — Patient Instructions (Addendum)
It was great seeing you today!  Plan discussed at today's visit: -Continue same BP meds, refilled -Start Lexapro 5 mg daily -Referral to psychiatry placed and social worker   Follow up in: 6 weeks   Take care and let us know if you have any questions or concerns prior to your next visit.  Dr. Rosana Berger

## 2022-05-19 NOTE — Progress Notes (Signed)
Established Patient Office Visit  Subjective    Patient ID: Cindy Brewer, female    DOB: 02-Jul-1971  Age: 51 y.o. MRN: [id number]  CC:  Chief Complaint  Patient presents with   Depression    Discuss change in medication   Mass    Bump on back that comes and goes    HPI Cindy Brewer presents to follow up.   Breast Cancer, multifocal triple negative Stage IIB: -Following with Oncology, Dr. Tasia Catchings. Note and labs reviewed from 12/09/21.  -S/p left lumpectomy and sentinel lymph node biopsy in 2019 -Patient opted out of chemotherapy  History of Iron Deficiency Anemia secondary to uterine fibroids: -Received iron transfusions in the past but since she had polypectomy and myomectomy her hemoglobin has been stable. Last CBC 12/07/21 14.8. Does have ongoing hot flashes since surgery. Had been on [place] but stopped due to history of breast cancer.   Hypertension: -Medications: Amlodipine 5 mg, HCTZ 25 mg, medication dose increased at LOV -Failed Meds: Losartan makes her heart race  -Checking BP at home (average): not checking currently but does have a cuff -Denies any SOB, CP, vision changes, LE edema or symptoms of hypotension.   MDD: -Mood status: exacerbated -Current treatment: Effexor 37.5 mg but feels tired and has tingling on the medication, stopped taking it Psychotherapy/counseling: no  Previous psychiatric medications: zoloft, had side effects of fatigue and sexual dysfunction  Depressed mood: yes Anxious mood: yes Fatigue: yes Suicidal ideations: no    05/19/2022    7:58 AM 03/03/2022   12:52 PM 02/02/2022    9:45 AM 02/25/2021   10:05 AM  Depression screen PHQ 2/9  Decreased Interest '3 3 3 2  '$ Down, Depressed, Hopeless '3 3 3 2  '$ PHQ - 2 Score '6 6 6 4  '$ Altered sleeping '3 3 3 3  '$ Tired, decreased energy '3 3 3 3  '$ Change in appetite '3 1 2 1  '$ Feeling bad or failure about yourself  '3 1 3 3  '$ Trouble concentrating '3 3 3 2  '$ Moving slowly or fidgety/restless 2 3 0 0   Suicidal thoughts 3 0 0 0  PHQ-9 Score '26 20 20 16  '$ Difficult doing work/chores Very difficult Somewhat difficult Very difficult    Health Maintenance: -Blood work UTD -Mammogram scheduled for November 29 -Colon cancer screening due   Outpatient Encounter Medications as of 05/19/2022  Medication Sig   amLODipine (NORVASC) 5 MG tablet TAKE 1 TABLET (5 MG TOTAL) BY MOUTH DAILY.   ASHWAGANDHA PO Take by mouth.   Black Cohosh 160 MG CAPS Take by mouth.   Cyanocobalamin (VITAMIN B 12 PO) Take by mouth.   hydrochlorothiazide (HYDRODIURIL) 25 MG tablet Take 1 tablet (25 mg total) by mouth daily.   Multiple Vitamin (MULTIVITAMIN WITH MINERALS) TABS tablet Take 1 tablet by mouth daily.   nicotine (NICODERM CQ - DOSED IN MG/24 HR) 7 mg/24hr patch Place 1 patch (7 mg total) onto the skin daily.   omeprazole (PRILOSEC) 20 MG capsule Take 1 capsule (20 mg total) by mouth daily.   pyridOXINE (VITAMIN B-6) 100 MG tablet Take 100 mg by mouth daily.   Thiamine HCl (VITAMIN B-1) 250 MG tablet Take 250 mg by mouth daily.   venlafaxine XR (EFFEXOR-XR) 37.5 MG 24 hr capsule TAKE 1 CAPSULE BY MOUTH DAILY WITH BREAKFAST.   Vitamin D, Ergocalciferol, (DRISDOL) 1.25 MG (50000 UNIT) CAPS capsule Take 1 capsule (50,000 Units total) by mouth every 7 (seven) days.   Facility-Administered Encounter Medications  as of 05/19/2022  Medication   0.9 %  sodium chloride infusion    Past Medical History:  Diagnosis Date   Anemia    Anxiety    Arthritis    right knee    Cancer ([place]) 2019   BREAST- Left   Depression    Fibroid    GERD (gastroesophageal reflux disease)     Past Surgical History:  Procedure Laterality Date   BREAST BIOPSY Left 2019   IDC   BREAST LUMPECTOMY Left 2019   BREAST LUMPECTOMY WITH AXILLARY LYMPH NODE BIOPSY Left 11/24/2017   Procedure: LEFT BREAST LUMPECTOMY WITH LEFT SENTINEL NODE LYMPH NODE BIOPSY;  Surgeon: Jovita Kussmaul, MD;  Location: [facility] ORS;  Service: General;  Laterality:  Left;   CESAREAN SECTION     EYE SURGERY     DETACHED RETINA 4/519   HYSTEROSCOPY WITH D & C N/A 03/14/2019   Procedure: DILATATION AND CURETTAGE [name];  Surgeon: Will Bonnet, MD;  Location: [facility] ORS;  Service: Gynecology;  Laterality: N/A;   IR EMBO TUMOR ORGAN ISCHEMIA INFARCT INC GUIDE ROADMAPPING      Family History  Problem Relation Age of Onset   Kidney disease Mother    Heart disease Mother    Stroke Mother    Hepatitis C Mother    Dementia Father    Blindness Father    Kidney disease Sister    Hypertension Brother    Diabetes Brother    Stroke Maternal Grandmother    Breast cancer Neg Hx     Social History   Socioeconomic History   Marital status: Divorced    Spouse name: Not on file   Number of children: Not on file   Years of education: Not on file   Highest education level: Not on file  Occupational History   Not on file  Tobacco Use   Smoking status: Every Day    Packs/day: 0.25    Years: 13.00    Total pack years: 3.25    Types: Cigarettes   Smokeless tobacco: Never  Vaping Use   Vaping Use: Never used  Substance and Sexual Activity   Alcohol use: Yes    Comment: weekends   Drug use: No   Sexual activity: Not Currently    Birth control/protection: None  Other Topics Concern   Not on file  Social History Narrative   ** Merged History Encounter **       Social Determinants of Health   Financial Resource Strain: Not on file  Food Insecurity: Not on file  Transportation Needs: Not on file  Physical Activity: Insufficiently Active (08/03/2017)   Exercise Vital Sign    Days of Exercise per Week: 7 days    Minutes of Exercise per Session: 20 min  Stress: No Stress Concern Present (08/03/2017)   [place]    Feeling of Stress : Only a little  Social Connections: Socially Isolated (08/03/2017)   Social Connection and Isolation Panel [NHANES]    Frequency  of Communication with Friends and Family: Once a week    Frequency of Social Gatherings with Friends and Family: Once a week    Attends Religious Services: Never    Marine scientist or Organizations: No    Attends Archivist Meetings: Never    Marital Status: Divorced  Human resources officer Violence: Not At Risk (08/03/2017)   Humiliation, Afraid, Rape, and Kick questionnaire    Fear of  Current or Ex-Partner: No    Emotionally Abused: No    Physically Abused: No    Sexually Abused: No    Review of Systems  Constitutional:  Negative for chills and fever.  Eyes:  Negative for blurred vision.  Respiratory:  Negative for shortness of breath.   Cardiovascular:  Negative for chest pain.  Skin:  Positive for itching and rash.  Psychiatric/Behavioral:  Positive for depression. The patient is nervous/anxious.       Objective    BP 132/80   Pulse 95   Temp 98.9 F (37.2 C) (Oral)   Resp 16   Ht '5\' 6"'$  (1.676 m)   Wt 176 lb 1.6 oz (79.9 kg)   LMP 03/27/2020   SpO2 97%   BMI 28.42 kg/m   Physical Exam Constitutional:      Appearance: Normal appearance.  HENT:     Head: Normocephalic and atraumatic.     Mouth/Throat:     Mouth: Mucous membranes are moist.     Pharynx: Oropharynx is clear.  Eyes:     Conjunctiva/sclera: Conjunctivae normal.  Cardiovascular:     Rate and Rhythm: Normal rate and regular rhythm.  Pulmonary:     Effort: Pulmonary effort is normal.     Breath sounds: Normal breath sounds.  Musculoskeletal:     Right lower leg: No edema.     Left lower leg: No edema.  Skin:    General: Skin is warm and dry.     Comments: Lesion consistent with sebaceous cyst on right side of back  Neurological:     General: No focal deficit present.     Mental Status: She is alert. Mental status is at baseline.  Psychiatric:        Mood and Affect: Mood normal.        Behavior: Behavior normal.     Comments: Tearful at times       Assessment & Plan:   1.  Moderate episode of recurrent major depressive disorder ([place]): Exacerbated. Effexor caused side effects, will discontinue. Start Lexapro 5 mg. Her daughter takes lexapro and does well. Referral to both Psychiatry and social work placed to help with counseling services. Follow up in 6 weeks for recheck.  - escitalopram (LEXAPRO) 5 MG tablet; Take 1 tablet (5 mg total) by mouth daily.  Dispense: 30 tablet; Refill: 1 - Ambulatory referral to Psychiatry - AMB Referral to [facility]  2. Primary hypertension: Blood pressure much better controlled, continue Amlodipine 5 mg, HCTZ 25 mg, both refilled today.   - amLODipine (NORVASC) 5 MG tablet; Take 1 tablet (5 mg total) by mouth daily.  Dispense: 90 tablet; Refill: 1 - hydrochlorothiazide (HYDRODIURIL) 25 MG tablet; Take 1 tablet (25 mg total) by mouth daily.  Dispense: 90 tablet; Refill: 1  3. Sebaceous cyst: Discussed using warm compresses, will re-evaluate at follow up, may require Derm referral for complete excision.   Return in about 6 weeks (around 06/30/2022).   Teodora Medici, DO

## 2022-05-24 ENCOUNTER — Telehealth: Payer: Self-pay | Admitting: *Deleted

## 2022-05-24 NOTE — Progress Notes (Signed)
  Care Coordination  Outreach Note  05/24/2022 Name: Cindy Brewer MRN: [id number] DOB: Aug 01, 1970   Care Coordination Outreach Attempts: An unsuccessful telephone outreach was attempted today to offer the patient information about available care coordination services as a benefit of their health plan.   Received referral   Follow Up Plan:  Additional outreach attempts will be made to offer the patient care coordination information and services.   Encounter Outcome:  No Answer  Brewer Hy, [place] Direct Dial: [phone]

## 2022-05-24 NOTE — Progress Notes (Signed)
  Care Coordination   Note   05/24/2022 Name: Cindy Brewer MRN: [id number] DOB: [birth year]  Cindy Brewer is a 51 y.o. year old female who sees Teodora Medici, DO for primary care. I reached out to [employer] by phone today to offer care coordination services.  Cindy Brewer was given information about Care Coordination services today including:   The Care Coordination services include support from the care team which includes your Nurse Coordinator, Clinical Social Worker, or Pharmacist.  The Care Coordination team is here to help remove barriers to the health concerns and goals most important to you. Care Coordination services are voluntary, and the patient may decline or stop services at any time by request to their care team member.   Care Coordination Consent Status: Patient agreed to services and verbal consent obtained.   Follow up plan:  Telephone appointment with care coordination team member scheduled for:  06/02/2022  Encounter Outcome:  Pt. Scheduled from referral   Julian Hy, [place] Direct Dial: [phone]

## 2022-06-01 ENCOUNTER — Ambulatory Visit
Admission: RE | Admit: 2022-06-01 | Discharge: 2022-06-01 | Disposition: A | Discharge status: Home / Self Care | Payer: Medicare HMO | Source: Ambulatory Visit | Attending: Oncology | Admitting: Oncology

## 2022-06-01 DIAGNOSIS — Z171 Estrogen receptor negative status [ER-]: Secondary | ICD-10-CM | POA: Diagnosis not present

## 2022-06-01 DIAGNOSIS — Z1231 Encounter for screening mammogram for malignant neoplasm of breast: Secondary | ICD-10-CM | POA: Insufficient documentation

## 2022-06-01 DIAGNOSIS — C50812 Malignant neoplasm of overlapping sites of left female breast: Secondary | ICD-10-CM | POA: Diagnosis not present

## 2022-06-02 ENCOUNTER — Telehealth: Payer: Self-pay | Admitting: Nurse Practitioner

## 2022-06-02 ENCOUNTER — Ambulatory Visit: Payer: Self-pay | Admitting: *Deleted

## 2022-06-02 DIAGNOSIS — K219 Gastro-esophageal reflux disease without esophagitis: Secondary | ICD-10-CM
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
## 2022-06-02 NOTE — Telephone Encounter (Signed)
Too soon 03/03/22 #30 3 RF Requested Prescriptions  Refused Prescriptions Disp Refills   omeprazole (PRILOSEC) 20 MG capsule [Pharmacy Med Name: OMEPRAZOLE DR 20 MG CAPSULE] 90 capsule 1    Sig: TAKE 1 CAPSULE BY MOUTH EVERY DAY     Gastroenterology: Proton Pump Inhibitors Passed - 06/02/2022  2:42 AM      Passed - Valid encounter within last 12 months    Recent Outpatient Visits           2 weeks ago Moderate episode of recurrent major depressive disorder [facility])   [place], DO   3 months ago Primary hypertension   [facility] Bo Merino, FNP   4 months ago Primary hypertension   [facility] Teodora Medici, DO       Future Appointments             In 4 weeks Teodora Medici, [facility], [facility]

## 2022-06-04 ENCOUNTER — Other Ambulatory Visit: Payer: Self-pay | Admitting: Internal Medicine

## 2022-06-04 DIAGNOSIS — E559 Vitamin D deficiency, unspecified: Secondary | ICD-10-CM

## 2022-06-06 ENCOUNTER — Ambulatory Visit (INDEPENDENT_AMBULATORY_CARE_PROVIDER_SITE_OTHER): Payer: Medicare HMO | Admitting: Nurse Practitioner

## 2022-06-06 ENCOUNTER — Encounter: Payer: Self-pay | Admitting: Internal Medicine

## 2022-06-06 ENCOUNTER — Ambulatory Visit
Admission: RE | Admit: 2022-06-06 | Discharge: 2022-06-06 | Disposition: A | Discharge status: Home / Self Care | Payer: Medicare HMO | Source: Ambulatory Visit | Attending: Nurse Practitioner | Admitting: Nurse Practitioner

## 2022-06-06 ENCOUNTER — Other Ambulatory Visit: Payer: Self-pay

## 2022-06-06 ENCOUNTER — Encounter: Payer: Self-pay | Admitting: Nurse Practitioner

## 2022-06-06 VITALS — BP 128/76 | HR 98 | Temp 98.6°F | Resp 16 | Ht 66.0 in | Wt 175.4 lb

## 2022-06-06 DIAGNOSIS — S0990XA Unspecified injury of head, initial encounter: Secondary | ICD-10-CM | POA: Diagnosis not present

## 2022-06-06 DIAGNOSIS — Y929 Unspecified place or not applicable: Secondary | ICD-10-CM | POA: Diagnosis not present

## 2022-06-06 DIAGNOSIS — Y939 Activity, unspecified: Secondary | ICD-10-CM | POA: Diagnosis not present

## 2022-06-06 DIAGNOSIS — W19XXXA Unspecified fall, initial encounter: Secondary | ICD-10-CM

## 2022-06-06 DIAGNOSIS — X58XXXA Exposure to other specified factors, initial encounter: Secondary | ICD-10-CM | POA: Diagnosis not present

## 2022-06-06 DIAGNOSIS — G44319 Acute post-traumatic headache, not intractable: Secondary | ICD-10-CM | POA: Diagnosis not present

## 2022-06-06 NOTE — Telephone Encounter (Signed)
Requested medication (s) are due for refill today: routing for review  Requested medication (s) are on the active medication list: yes  Last refill:02/08/22    Future visit scheduled: yes  Notes to clinic:  Manual Review: Route requests for 50,000 IU strength to the provider      Requested Prescriptions  Pending Prescriptions Disp Refills   Vitamin D, Ergocalciferol, (DRISDOL) 1.25 MG (50000 UNIT) CAPS capsule [Pharmacy Med Name: VITAMIN D2 1.'25MG'$ (50,000 UNIT)] 12 capsule 0    Sig: Take 1 capsule (50,000 Units total) by mouth every 7 (seven) days.     Endocrinology:  Vitamins - Vitamin D Supplementation 2 Failed - 06/04/2022 11:50 AM      Failed - Manual Review: Route requests for 50,000 IU strength to the provider      Failed - Vitamin D in normal range and within 360 days    Vit D, 25-Hydroxy  Date Value Ref Range Status  02/03/2022 20 (L) 30 - 100 ng/mL Final    Comment:    Vitamin D Status         25-OH Vitamin D: . Deficiency:                    <20 ng/mL Insufficiency:             20 - 29 ng/mL Optimal:                 > or = 30 ng/mL . For 25-OH Vitamin D testing on patients on  D2-supplementation and patients for whom quantitation  of D2 and D3 fractions is required, the [employer]ureD(TM) 25-OH VIT D, (D2,D3), LC/MS/MS is recommended: order  code [id number] (patients >18yr). . See Note 1 . Note 1 . For additional information, please refer to  http://education.Quest[facility].com/faq/FAQ199  (This link is being provided for informational/ educational purposes only.)          Passed - Ca in normal range and within 360 days    Calcium  Date Value Ref Range Status  03/03/2022 9.9 8.6 - 10.2 mg/dL Final         Passed - Valid encounter within last 12 months    Recent Outpatient Visits           Today Acute post-traumatic headache, not intractable   [facility]   2 weeks ago Moderate episode of recurrent major depressive  disorder ([facility]   [place]   3 months ago Primary hypertension   [facility]   4 months ago [facility]   [facility]       Future Appointments             [facility]

## 2022-06-06 NOTE — Progress Notes (Signed)
BP 128/76   Pulse 98   Temp 98.6 F (37 C) (Oral)   Resp 16   Ht _0  (1.676 m)   Wt 175 lb 6.4 oz (79.6 kg)   LMP 03/27/2020   SpO2 97%   BMI 28.31 kg/m    Subjective:    Patient ID: Cindy Brewer, female    DOB: [birth year], 51 y.o.   MRN: [id number]  HPI: Cindy Brewer is a 51 y.o. female  Chief Complaint  Patient presents with   [place] in shower 3 days ago. Hit her head and was unconscious for a few minutes.   Headache   Fall/headache:  Patient reports she fell in the shower three days ago and lost consciousness for unknown amount of time.  She reports that she was in the shower bent down to turn off shower and blacked out. She says she woke up to the water being cold. She says that the water was warm before it happened. She denies any recent illness.  She says that her head is hurting her she says it feels like  a pressure.  She says that she is feeling sore but mainly her head is hurting. She describes the pain as a pressure.  She says that on Saturday when it happened she was confused and groggy feeling with nausea.  She says she does have some neck pain.  No cervical tenderness noted.  She has full range of motion. She denies any chest pain, shortness of breath, or recent illness. Neuro exam performed will get stat head ct.  Patient denies being on any blood thinners.    Relevant past medical, surgical, family and social history reviewed and updated as indicated. Interim medical history since our last visit reviewed. Allergies and medications reviewed and updated.  Review of Systems  Constitutional: Negative for fever or weight change.  Respiratory: Negative for cough and shortness of breath.   Cardiovascular: Negative for chest pain or palpitations.  Gastrointestinal: Negative for abdominal pain, no bowel changes.  Musculoskeletal: Negative for gait problem or joint swelling.  Skin: Negative for rash.  Neurological: Negative for dizziness,  positive for headache.  No other specific complaints in a complete review of systems (except as listed in HPI above).      Objective:    BP 128/76   Pulse 98   Temp 98.6 F (37 C) (Oral)   Resp 16   Ht _1  (1.676 m)   Wt 175 lb 6.4 oz (79.6 kg)   LMP 03/27/2020   SpO2 97%   BMI 28.31 kg/m   Wt Readings from Last 3 Encounters:  06/06/22 175 lb 6.4 oz (79.6 kg)  05/19/22 176 lb 1.6 oz (79.9 kg)  03/03/22 179 lb 8 oz (81.4 kg)    Physical Exam  Constitutional: Patient appears well-developed and well-nourished.  No distress.  HEENT: head atraumatic, normocephalic, pupils equal and reactive to light, ears TMs clear, neck supple, throat within normal limits Cardiovascular: Normal rate, regular rhythm and normal heart sounds.  No murmur heard. No BLE edema. Pulmonary/Chest: Effort normal and breath sounds normal. No respiratory distress. Abdominal: Soft.  There is no tenderness. Neuro:  equal grip, follows commands, normal gait Psychiatric: Patient has a normal mood and affect. behavior is normal. Judgment and thought content normal.  Results for orders placed or performed in visit on 03/03/22  COMPLETE METABOLIC PANEL WITH GFR  Result Value Ref Range   Glucose, Bld 102 (H) 65 - 99  mg/dL   BUN 15 7 - 25 mg/dL   Creat 0.80 0.50 - 0.99 mg/dL   eGFR 90 > OR = 60 mL/min/1.31m   BUN/Creatinine Ratio SEE NOTE: 6 - 22 (calc)   Sodium 144 135 - 146 mmol/L   Potassium 5.0 3.5 - 5.3 mmol/L   Chloride 109 98 - 110 mmol/L   CO2 26 20 - 32 mmol/L   Calcium 9.9 8.6 - 10.2 mg/dL   Total Protein 7.0 6.1 - 8.1 g/dL   Albumin 4.3 3.6 - 5.1 g/dL   Globulin 2.7 1.9 - 3.7 g/dL (calc)   AG Ratio 1.6 1.0 - 2.5 (calc)   Total Bilirubin 0.4 0.2 - 1.2 mg/dL   Alkaline phosphatase (APISO) 47 31 - 125 U/L   AST 32 10 - 35 U/L   ALT 32 (H) 6 - 29 U/L      Assessment & Plan:   Problem List Items Addressed This Visit   None Visit Diagnoses     Acute post-traumatic headache, not intractable     -  Primary   headache s/p fall, get stat head ct, ice area, may take tylenol for pain, stay off cell phone, tv and other activites that increase head pain.   Relevant Orders   CT HEAD WO CONTRAST (5MM)   Fall, initial encounter       headache s/p fall, get stat head ct, ice area, may take tylenol for pain, stay off cell phone, tv and other activites that increase head pain.   Relevant Orders   CT HEAD WO CONTRAST (5MM)        Follow up plan: Return if symptoms worsen or fail to improve.

## 2022-06-07 ENCOUNTER — Inpatient Hospital Stay: Payer: Medicare HMO | Attending: Oncology

## 2022-06-07 DIAGNOSIS — C50812 Malignant neoplasm of overlapping sites of left female breast: Secondary | ICD-10-CM | POA: Diagnosis not present

## 2022-06-07 DIAGNOSIS — F17210 Nicotine dependence, cigarettes, uncomplicated: Secondary | ICD-10-CM | POA: Diagnosis not present

## 2022-06-07 DIAGNOSIS — D7589 Other specified diseases of blood and blood-forming organs: Secondary | ICD-10-CM | POA: Diagnosis not present

## 2022-06-07 DIAGNOSIS — Z79899 Other long term (current) drug therapy: Secondary | ICD-10-CM | POA: Insufficient documentation

## 2022-06-07 DIAGNOSIS — Z171 Estrogen receptor negative status [ER-]: Secondary | ICD-10-CM | POA: Diagnosis not present

## 2022-06-07 LAB — CBC WITH DIFFERENTIAL/PLATELET
Abs Immature Granulocytes: 0.02 10*3/uL (ref 0.00–0.07)
Basophils Absolute: 0.1 10*3/uL (ref 0.0–0.1)
Basophils Relative: 1 %
Eosinophils Absolute: 0.2 10*3/uL (ref 0.0–0.5)
Eosinophils Relative: 3 %
HCT: 42.3 % (ref 36.0–46.0)
Hemoglobin: 14.1 g/dL (ref 12.0–15.0)
Immature Granulocytes: 0 %
Lymphocytes Relative: 25 %
Lymphs Abs: 1.6 10*3/uL (ref 0.7–4.0)
MCH: 33.4 pg (ref 26.0–34.0)
MCHC: 33.3 g/dL (ref 30.0–36.0)
MCV: 100.2 fL — ABNORMAL HIGH (ref 80.0–100.0)
Monocytes Absolute: 0.4 10*3/uL (ref 0.1–1.0)
Monocytes Relative: 6 %
Neutro Abs: 4.1 10*3/uL (ref 1.7–7.7)
Neutrophils Relative %: 65 %
Platelets: 271 10*3/uL (ref 150–400)
RBC: 4.22 MIL/uL (ref 3.87–5.11)
RDW: 12.3 % (ref 11.5–15.5)
WBC: 6.4 10*3/uL (ref 4.0–10.5)
nRBC: 0 % (ref 0.0–0.2)

## 2022-06-07 LAB — COMPREHENSIVE METABOLIC PANEL
ALT: 22 U/L (ref 0–44)
AST: 24 U/L (ref 15–41)
Albumin: 4.1 g/dL (ref 3.5–5.0)
Alkaline Phosphatase: 54 U/L (ref 38–126)
Anion gap: 9 (ref 5–15)
BUN: 14 mg/dL (ref 6–20)
CO2: 24 mmol/L (ref 22–32)
Calcium: 9.1 mg/dL (ref 8.9–10.3)
Chloride: 107 mmol/L (ref 98–111)
Creatinine, Ser: 0.75 mg/dL (ref 0.44–1.00)
GFR, Estimated: >60 mL/min (ref >60)
Glucose, Bld: 105 mg/dL — ABNORMAL HIGH (ref 70–99)
Potassium: 3.7 mmol/L (ref 3.5–5.1)
Sodium: 140 mmol/L (ref 135–145)
Total Bilirubin: 0.5 mg/dL (ref 0.3–1.2)
Total Protein: 7.9 g/dL (ref 6.5–8.1)

## 2022-06-08 ENCOUNTER — Inpatient Hospital Stay (HOSPITAL_BASED_OUTPATIENT_CLINIC_OR_DEPARTMENT_OTHER): Payer: Medicare HMO | Admitting: Oncology

## 2022-06-08 ENCOUNTER — Encounter: Payer: Self-pay | Admitting: Oncology

## 2022-06-08 VITALS — BP 162/104 | HR 95 | Temp 98.1°F | Resp 18 | Wt 171.9 lb

## 2022-06-08 DIAGNOSIS — Z79899 Other long term (current) drug therapy: Secondary | ICD-10-CM | POA: Diagnosis not present

## 2022-06-08 DIAGNOSIS — Z171 Estrogen receptor negative status [ER-]: Secondary | ICD-10-CM | POA: Diagnosis not present

## 2022-06-08 DIAGNOSIS — C50812 Malignant neoplasm of overlapping sites of left female breast: Secondary | ICD-10-CM

## 2022-06-08 DIAGNOSIS — D7589 Other specified diseases of blood and blood-forming organs: Secondary | ICD-10-CM

## 2022-06-08 DIAGNOSIS — F17210 Nicotine dependence, cigarettes, uncomplicated: Secondary | ICD-10-CM | POA: Diagnosis not present

## 2022-06-08 LAB — CANCER ANTIGEN 27.29: CA 27.29: 14.5 U/mL (ref 0.0–38.6)

## 2022-06-08 LAB — CANCER ANTIGEN 15-3: CA 15-3: 14.2 U/mL (ref 0.0–25.0)

## 2022-06-08 NOTE — Assessment & Plan Note (Addendum)
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  Labs reviewed and discussed patient.  Cancer markers are within normal limits. Continue annual mammogram surveillance.  Recent mammogram negative.  Recommend patient to continue calcium and vitamin D supplementation.   Patient desires breast reduction, refer to plastic surgery

## 2022-06-08 NOTE — Assessment & Plan Note (Signed)
Recommend patient to decrease alcohol intake.  Start empiric Vitamin B12 supplementation.

## 2022-06-08 NOTE — Progress Notes (Signed)
Hematology/Oncology Progress note [phone] [phone]      Patient Care Team: Teodora Medici, DO as PCP - General (Internal Medicine) Patient, No Pcp Per (General Practice) Earlie Server, MD as Consulting Physician (Hematology and Oncology)  ASSESSMENT & PLAN:   Cancer Staging  Malignant neoplasm of overlapping sites of left breast in female, estrogen receptor negative ([place]) Staging form: Breast, AJCC 8th Edition - Clinical stage from 08/25/2017: Stage IIB (cT2(2), cN0, cM0, G3, ER-, PR-, HER2-) - Signed by Earlie Server, MD on 08/25/2017  Malignant neoplasm of overlapping sites of left breast in female, estrogen receptor negative ([place]) # 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  Labs reviewed and discussed patient.  Cancer markers are within normal limits. Continue annual mammogram surveillance.  Recent mammogram negative.  Recommend patient to continue calcium and vitamin D supplementation.   Patient desires breast reduction, refer to plastic surgery  Macrocytosis without anemia Recommend patient to decrease alcohol intake.  Start empiric Vitamin B12 supplementation.   Orders Placed This Encounter  Procedures   CBC with Differential/Platelet    Standing Status:   Future    Standing Expiration Date:   06/09/2023   Comprehensive metabolic panel    Standing Status:   Future    Standing Expiration Date:   06/08/2023   Vitamin B12    Standing Status:   Future    Standing Expiration Date:   06/09/2023   Folate    Standing Status:   Future    Standing Expiration Date:   06/09/2023   Ambulatory referral to Plastic Surgery    Referral Priority:   Routine    Referral Type:   Surgical    Referral Reason:   Specialty Services Required    Referred to Provider:   Wallace Going, DO    Requested Specialty:   Plastic Surgery    Number of Visits Requested:   1   Follow up in 6 months.   All  questions were answered. The patient knows to call the clinic with any problems, questions or concerns.  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 two separate foci of invasive mammary carcinoma with associated tumor necrosis, DCIS present, fibroadenoma 54m, Foci 1 is 448m and foci 2 is 3534mmargins are negative. Sentinel LN 0/0 involved. Negative LVI declined neoadjuvant or adjuvant chemotherapy  # Genetic testing: BRCA neg Testing did not reveal a pathogenic mutation in any of the genes analyzed.  #Iron deficiency anemia status post multiple IV iron infusions. # Uterus Fibroid disease;  underwent endometrial biopsy establish care with GYN Dr. JacGlennon Macd had D&C, polypectomy, myomectomy   # Thyroid nodule, previously ordered thyroid ultrasound not done.  05/31/21 Bilateral mammogram showed no mammographic evidence of malignancy   INTERVAL HISTORY Cindy Brewer a [age]o. female who has above history reviewed by me today presents for follow up visit for Triple negative breast cancer  Patient reports feeling well.   Pt reports she had a fall on 12/2 and came to ED and had CT scan done.  06/06/22 CT head negative for intracranial injury. She has no new breast concerns.  .  Review of Systems  Constitutional:  Negative  for appetite change, chills, fatigue and fever.  HENT:   Negative for hearing loss and voice change.   Eyes:  Negative for eye problems.  Respiratory:  Negative for chest tightness, cough and shortness of breath.   Cardiovascular:  Negative for chest pain.  Gastrointestinal:  Negative for abdominal distention, abdominal pain and blood in stool.  Endocrine: Negative for hot flashes.   Genitourinary:  Negative for difficulty urinating and frequency.   Musculoskeletal:  Negative for arthralgias.  Skin:  Negative for itching and rash.  Neurological:  Negative for extremity weakness.  Hematological:  Negative for adenopathy.  Psychiatric/Behavioral:  Negative for confusion.      MEDICAL HISTORY:  Past Medical History:  Diagnosis Date   Anemia    Anxiety    Arthritis    right knee    Cancer ([place]) 2019   BREAST- Left   Depression    Fibroid    GERD (gastroesophageal reflux disease)     SURGICAL HISTORY: Past Surgical History:  Procedure Laterality Date   BREAST BIOPSY Left 2019   IDC   BREAST LUMPECTOMY WITH AXILLARY LYMPH NODE BIOPSY Left 11/24/2017   Procedure: LEFT BREAST LUMPECTOMY WITH LEFT SENTINEL NODE LYMPH NODE BIOPSY;  Surgeon: Jovita Kussmaul, MD;  Location: [facility] ORS;  Service: General;  Laterality: Left;   CESAREAN SECTION     EYE SURGERY     DETACHED RETINA 4/519   HYSTEROSCOPY WITH D & C N/A 03/14/2019   Procedure: DILATATION AND CURETTAGE [name];  Surgeon: Will Bonnet, MD;  Location: [facility] ORS;  Service: Gynecology;  Laterality: N/A;   IR EMBO TUMOR ORGAN ISCHEMIA INFARCT INC GUIDE ROADMAPPING      SOCIAL HISTORY: Social History   Socioeconomic History   Marital status: Divorced    Spouse name: Not on file   Number of children: Not on file   Years of education: Not on file   Highest education level: Not on file  Occupational History   Not on file  Tobacco Use   Smoking status: Every Day    Packs/day: 0.25    Years: 13.00    Total pack years: 3.25    Types: Cigarettes   Smokeless tobacco: Never  Vaping Use   Vaping Use: Never used  Substance and Sexual Activity   Alcohol use: Yes    Comment: weekends   Drug use: No   Sexual activity: Not Currently    Birth control/protection: None  Other Topics Concern   Not on file  Social History Narrative   ** Merged History Encounter **       Social  Determinants of Health   Financial Resource Strain: Not on file  Food Insecurity: No Food Insecurity (06/02/2022)   Hunger Vital Sign    Worried About Running Out of Food in the Last Year: Never true    Ran Out of Food in the Last Year: Never true  Transportation Needs: No Transportation Needs (06/02/2022)   PRAPARE - Hydrologist (Medical): No    Lack of Transportation (Non-Medical): No  Physical Activity: Insufficiently Active (08/03/2017)   Exercise Vital Sign    Days of Exercise per Week: 7 days    Minutes of Exercise per Session: 20 min  Stress: Stress Concern Present (06/02/2022)   [place]    Feeling of Stress : To some extent  Social Connections: Socially Isolated (08/03/2017)   Social Connection and Isolation Panel [NHANES]  Frequency of Communication with Friends and Family: Once a week    Frequency of Social Gatherings with Friends and Family: Once a week    Attends Religious Services: Never    Marine scientist or Organizations: No    Attends Archivist Meetings: Never    Marital Status: Divorced  Human resources officer Violence: Not At Risk (06/02/2022)   Humiliation, Afraid, Rape, and Kick questionnaire    Fear of Current or Ex-Partner: No    Emotionally Abused: No    Physically Abused: No    Sexually Abused: No    FAMILY HISTORY: Family History  Problem Relation Age of Onset   Kidney disease Mother    Heart disease Mother    Stroke Mother    Hepatitis C Mother    Dementia Father    Blindness Father    Kidney disease Sister    Hypertension Brother    Diabetes Brother    Stroke Maternal Grandmother    Breast cancer Neg Hx     ALLERGIES:  is allergic to demerol [meperidine], oxycodone, sulfa antibiotics, and percocet [oxycodone-acetaminophen].  MEDICATIONS:  Current Outpatient Medications  Medication Sig Dispense Refill   amLODipine (NORVASC) 5 MG  tablet Take 1 tablet (5 mg total) by mouth daily. 90 tablet 1   ASHWAGANDHA PO Take by mouth.     Black Cohosh 160 MG CAPS Take by mouth.     Cyanocobalamin (VITAMIN B 12 PO) Take by mouth.     escitalopram (LEXAPRO) 5 MG tablet Take 1 tablet (5 mg total) by mouth daily. 30 tablet 1   hydrochlorothiazide (HYDRODIURIL) 25 MG tablet Take 1 tablet (25 mg total) by mouth daily. 90 tablet 1   Multiple Vitamin (MULTIVITAMIN WITH MINERALS) TABS tablet Take 1 tablet by mouth daily.     nicotine (NICODERM CQ - DOSED IN MG/24 HR) 7 mg/24hr patch Place 1 patch (7 mg total) onto the skin daily. 28 patch 0   omeprazole (PRILOSEC) 20 MG capsule Take 1 capsule (20 mg total) by mouth daily. 30 capsule 3   pyridOXINE (VITAMIN B-6) 100 MG tablet Take 100 mg by mouth daily.     Thiamine HCl (VITAMIN B-1) 250 MG tablet Take 250 mg by mouth daily.     Vitamin D, Ergocalciferol, (DRISDOL) 1.25 MG (50000 UNIT) CAPS capsule TAKE 1 CAPSULE (50,000 UNITS TOTAL) BY MOUTH EVERY 7 (SEVEN) DAYS 12 capsule 0   No current facility-administered medications for this visit.   Facility-Administered Medications Ordered in Other Visits  Medication Dose Route Frequency Provider Last Rate Last Admin   0.9 %  sodium chloride infusion   Intravenous Continuous Earlie Server, MD 10 mL/hr at 01/11/19 1412 New Bag at 01/11/19 1412     PHYSICAL EXAMINATION: ECOG PERFORMANCE STATUS: 0 - Asymptomatic Vitals:   06/08/22 1401  BP: (!) 162/104  Pulse: 95  Resp: 18  Temp: 98.1 F (36.7 C)   Filed Weights   06/08/22 1401  Weight: 171 lb 14.4 oz (78 kg)    Physical Exam Constitutional:      General: She is not in acute distress.    Appearance: She is not diaphoretic.  HENT:     Head: Normocephalic.     Nose: Nose normal.     Mouth/Throat:     Pharynx: No oropharyngeal exudate.  Eyes:     General: No scleral icterus.    Pupils: Pupils are equal, round, and reactive to light.  Neck:     Vascular: No JVD.  Cardiovascular:      Rate and Rhythm: Normal rate and regular rhythm.  Pulmonary:     Effort: Pulmonary effort is normal. No respiratory distress.     Breath sounds: Normal breath sounds. No wheezing.  Abdominal:     General: Bowel sounds are normal. There is no distension.     Palpations: Abdomen is soft. There is no mass.     Tenderness: There is no abdominal tenderness.  Musculoskeletal:        General: No tenderness. Normal range of motion.     Cervical back: Normal range of motion.  Lymphadenopathy:     Cervical: No cervical adenopathy.  Skin:    General: Skin is warm and dry.     Coloration: Skin is not pale.     Findings: No erythema or rash.  Neurological:     Mental Status: She is alert and oriented to person, place, and time. Mental status is at baseline.     Cranial Nerves: No cranial nerve deficit.     Motor: No abnormal muscle tone.     Coordination: Coordination normal.  Psychiatric:        Mood and Affect: Mood and affect normal.        Cognition and Memory: Memory normal.    Breast exam was performed in seated and lying down position. Patient is status post left lumpectomy with a well-healing surgical scar.no palpable breast masses or axillary lymphadenopathy bilaterally.       LABORATORY DATA:  I have reviewed the data as listed     Latest Ref Rng & Units 06/07/2022    1:40 PM 12/07/2021    1:32 PM 06/10/2021    1:13 PM  CBC  WBC 4.0 - 10.5 K/uL 6.4  8.0  5.5   Hemoglobin 12.0 - 15.0 g/dL 14.1  14.8  12.9   Hematocrit 36.0 - 46.0 % 42.3  43.9  38.3   Platelets 150 - 400 K/uL 271  281  211       Latest Ref Rng & Units 06/07/2022    1:40 PM 03/03/2022    1:45 PM 02/03/2022    9:34 AM  CMP  Glucose 70 - 99 mg/dL 105  102  103   BUN 6 - 20 mg/dL _0 Creatinine 0.44 - 1.00 mg/dL 0.75  0.80  0.98   Sodium 135 - 145 mmol/L 140  144  142   Potassium 3.5 - 5.1 mmol/L 3.7  5.0  4.2   Chloride 98 - 111 mmol/L 107  109  102   CO2 22 - 32 mmol/L _1 Calcium 8.9 -  10.3 mg/dL 9.1  9.9  10.1   Total Protein 6.5 - 8.1 g/dL 7.9  7.0  7.7   Total Bilirubin 0.3 - 1.2 mg/dL 0.5  0.4  0.6   Alkaline Phos 38 - 126 U/L 54     AST 15 - 41 U/L 24  32  38   ALT 0 - 44 U/L 22  32  35

## 2022-06-08 NOTE — Progress Notes (Signed)
Pt here for follow up. Pt reports she had a fall on 12/2 and came to ED and had CT scan done.

## 2022-06-10 ENCOUNTER — Other Ambulatory Visit: Payer: Self-pay | Admitting: Internal Medicine

## 2022-06-10 DIAGNOSIS — F331 Major depressive disorder, recurrent, moderate: Secondary | ICD-10-CM

## 2022-06-10 NOTE — Telephone Encounter (Signed)
Requested medication (s) are due for refill today:   Yes  Requested medication (s) are on the active medication list:   Yes  Future visit scheduled:   Yes 06/30/2022   Last ordered: 05/19/2022 #30, 1 refill  Returned because a 90 day supply and a DX Code being requested   Requested Prescriptions  Pending Prescriptions Disp Refills   escitalopram (LEXAPRO) 5 MG tablet [Pharmacy Med Name: ESCITALOPRAM 5 MG TABLET] 90 tablet 1    Sig: Take 1 tablet (5 mg total) by mouth daily.     Psychiatry:  Antidepressants - SSRI Passed - 06/10/2022  9:31 AM      Passed - Completed PHQ-2 or PHQ-9 in the last 360 days      Passed - Valid encounter within last 6 months    Recent Outpatient Visits           4 days ago Acute post-traumatic headache, not intractable   [facility] Serafina Royals F, FNP   3 weeks ago Moderate episode of recurrent major depressive disorder [facility])   [place], DO   3 months ago Primary hypertension   [facility] Bo Merino, FNP   4 months ago Primary hypertension   [facility] Teodora Medici, DO       Future Appointments             In 2 weeks Teodora Medici, [facility], [facility]

## 2022-06-28 ENCOUNTER — Institutional Professional Consult (permissible substitution): Payer: Medicare HMO | Admitting: Plastic Surgery
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
## 2022-06-30 ENCOUNTER — Ambulatory Visit (INDEPENDENT_AMBULATORY_CARE_PROVIDER_SITE_OTHER): Payer: Medicare HMO | Admitting: Family Medicine

## 2022-06-30 ENCOUNTER — Ambulatory Visit: Payer: Medicare HMO | Admitting: Internal Medicine

## 2022-06-30 ENCOUNTER — Encounter: Payer: Self-pay | Admitting: Family Medicine

## 2022-06-30 VITALS — BP 124/76 | HR 93 | Resp 16 | Ht 66.0 in | Wt 177.0 lb

## 2022-06-30 DIAGNOSIS — G44319 Acute post-traumatic headache, not intractable: Secondary | ICD-10-CM

## 2022-06-30 DIAGNOSIS — I10 Essential (primary) hypertension: Secondary | ICD-10-CM

## 2022-06-30 DIAGNOSIS — R29818 Other symptoms and signs involving the nervous system: Secondary | ICD-10-CM | POA: Diagnosis not present

## 2022-06-30 DIAGNOSIS — F331 Major depressive disorder, recurrent, moderate: Secondary | ICD-10-CM
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
## 2022-08-02 ENCOUNTER — Ambulatory Visit (INDEPENDENT_AMBULATORY_CARE_PROVIDER_SITE_OTHER): Payer: Medicare HMO | Admitting: Psychiatry

## 2022-08-02 ENCOUNTER — Encounter: Payer: Self-pay | Admitting: Adult Health

## 2022-08-02 ENCOUNTER — Ambulatory Visit (INDEPENDENT_AMBULATORY_CARE_PROVIDER_SITE_OTHER): Payer: Medicare HMO | Admitting: Adult Health

## 2022-08-02 ENCOUNTER — Encounter: Payer: Self-pay | Admitting: Psychiatry

## 2022-08-02 VITALS — BP 132/84 | HR 84 | Temp 98.4°F | Ht 66.0 in | Wt 173.0 lb

## 2022-08-02 VITALS — BP 149/98 | HR 80 | Temp 97.8°F | Ht 66.0 in | Wt 173.4 lb

## 2022-08-02 DIAGNOSIS — F4310 Post-traumatic stress disorder, unspecified: Secondary | ICD-10-CM | POA: Diagnosis not present

## 2022-08-02 DIAGNOSIS — F331 Major depressive disorder, recurrent, moderate: Secondary | ICD-10-CM | POA: Diagnosis not present

## 2022-08-02 DIAGNOSIS — F1090 Alcohol use, unspecified, uncomplicated: Secondary | ICD-10-CM | POA: Diagnosis not present

## 2022-08-02 DIAGNOSIS — R0683 Snoring: Secondary | ICD-10-CM

## 2022-08-02 MED ORDER — NALTREXONE HCL 50 MG PO TABS: 25.0000 mg | ORAL_TABLET | Freq: Every day | ORAL | 2 refills | Status: AC

## 2022-08-02 NOTE — Patient Instructions (Signed)
Increase lexapro 10 mg daily  Start naltrexone 25 mg at night  Referral to IOP Referral to therapy (wait list) Obtain lab (TSH) - [facility] Next appointment: 4/2 at 8:30

## 2022-08-02 NOTE — Patient Instructions (Signed)
Set up for home sleep study  Healthy sleep regimen  Do not drive if sleepy  Work on healthy weight.  Follow up in 3 month to discuss sleep results and treatment

## 2022-08-02 NOTE — Progress Notes (Signed)
$'@Patient'e$  ID: Cindy Brewer, female    DOB: 07/07/1970, 52 y.o.   MRN: [id number]  Chief Complaint  Patient presents with   Consult    Referring provider: Teodora Medici, DO  HPI: 52 year old female seen for sleep consult August 02, 2022 for snoring, restless sleep and daytime sleepiness  TEST/EVENTS :   08/02/2022 Sleep consult  Patient presents for sleep consult today.  Kindly referred by her primary care provider Dr. Rosana Berger.  Patient complains of snoring, restless sleep and daytime sleepiness.  Patient complains of significant daytime sleepiness.  Feels that she is tired all the time.  No matter how much sleep she gets she always feels sleepy patient typically goes to bed between 9 and 11 PM.  Takes an hour or so to go to sleep.  Is up several times throughout the night.  Gets up at 5:30 AM.  Has never had a previous sleep study.  Takes melatonin on occasion.  Patient says if she has opportunities to nap she will nap every day at least 1 to 3 hours.  She drinks about 1 cup of coffee daily.  Patient says she has very vivid dreams and occasional nightmares.  Has had episodes over the years of sleep paralysis.  Where she wakes up very briefly and feels that she cannot move for short period time.  She denies any symptoms of cataplexy.  Epworth score is 19 out of 24.  Gets very sleepy if she sits down to watch TV and afternoon hours and after eating.  Patient says she has very loud snoring.  Does not have any removable dental work. Current weight is at 173 pounds, BMI 27.  Denies any history of stroke or congestive heart failure.  Medical history is significant for hypertension, anemia, hyperlipidemia, breast cancer in 2019 status post lumpectomy.  Did not receive chemo or radiation.,  Uterine fibroids, GERD, depression, anxiety  Social history.  Patient is divorced.  Lives alone.  She works as a Armed forces operational officer.  She has 5 children age 42-32.  She is a former smoker.   Drinks alcohol socially.  No drug use.  Family history none.  Surgical history lumpectomy 2019, detached retina eye surgery 2020. Uterine fibroid surgery 2020.    Allergies  Allergen Reactions   Demerol [Meperidine] Anaphylaxis    Pt does not tolerate pain killers.   Oxycodone Anaphylaxis   Sulfa Antibiotics Anaphylaxis   Percocet [Oxycodone-Acetaminophen] Swelling     There is no immunization history on file for this patient.  Past Medical History:  Diagnosis Date   Anemia    Anxiety    Arthritis    right knee    Cancer ([place]) 2019   BREAST- Left   Depression    Fibroid    GERD (gastroesophageal reflux disease)     Tobacco History: Social History   Tobacco Use  Smoking Status Former   Packs/day: 0.25   Years: 13.00   Total pack years: 3.25   Types: Cigarettes   Quit date: 05/04/2022   Years since quitting: 0.2  Smokeless Tobacco Never   Counseling given: Not Answered   Outpatient Medications Prior to Visit  Medication Sig Dispense Refill   amLODipine (NORVASC) 5 MG tablet Take 1 tablet (5 mg total) by mouth daily. 90 tablet 1   ASHWAGANDHA PO Take by mouth.     Black Cohosh 160 MG CAPS Take by mouth.     Cyanocobalamin (VITAMIN B 12 PO) Take by mouth.     escitalopram (  LEXAPRO) 5 MG tablet TAKE 1 TABLET (5 MG TOTAL) BY MOUTH DAILY. 90 tablet 1   hydrochlorothiazide (HYDRODIURIL) 25 MG tablet Take 1 tablet (25 mg total) by mouth daily. 90 tablet 1   Multiple Vitamin (MULTIVITAMIN WITH MINERALS) TABS tablet Take 1 tablet by mouth daily.     naltrexone (DEPADE) 50 MG tablet Take 0.5 tablets (25 mg total) by mouth at bedtime. 15 tablet 2   omeprazole (PRILOSEC) 20 MG capsule Take 1 capsule (20 mg total) by mouth daily. 30 capsule 3   pyridOXINE (VITAMIN B-6) 100 MG tablet Take 100 mg by mouth daily.     Thiamine HCl (VITAMIN B-1) 250 MG tablet Take 250 mg by mouth daily.     Vitamin D, Ergocalciferol, (DRISDOL) 1.25 MG (50000 UNIT) CAPS capsule TAKE 1 CAPSULE  (50,000 UNITS TOTAL) BY MOUTH EVERY 7 (SEVEN) DAYS 12 capsule 0   nicotine (NICODERM CQ - DOSED IN MG/24 HR) 7 mg/24hr patch Place 1 patch (7 mg total) onto the skin daily. 28 patch 0   Facility-Administered Medications Prior to Visit  Medication Dose Route Frequency Provider Last Rate Last Admin   0.9 %  sodium chloride infusion   Intravenous Continuous Earlie Server, MD 10 mL/hr at 01/11/19 1412 New Bag at 01/11/19 1412     Review of Systems:   Constitutional:   No  weight loss, night sweats,  Fevers, chills,  +fatigue, or  lassitude.  HEENT:   No headaches,  Difficulty swallowing,  Tooth/dental problems, or  Sore throat,                No sneezing, itching, ear ache, nasal congestion, post nasal drip,   CV:  No chest pain,  Orthopnea, PND, swelling in lower extremities, anasarca, dizziness, palpitations, syncope.   GI  No heartburn, indigestion, abdominal pain, nausea, vomiting, diarrhea, change in bowel habits, loss of appetite, bloody stools.   Resp: No shortness of breath with exertion or at rest.  No excess mucus, no productive cough,  No non-productive cough,  No coughing up of blood.  No change in color of mucus.  No wheezing.  No chest wall deformity  Skin: no rash or lesions.  GU: no dysuria, change in color of urine, no urgency or frequency.  No flank pain, no hematuria   MS:  No joint pain or swelling.  No decreased range of motion.  No back pain.    Physical Exam  BP 132/84 (BP Location: Right Arm, Patient Position: Sitting, Cuff Size: Normal)   Pulse 84   Temp 98.4 F (36.9 C) (Oral)   Ht '5\' 6"'$  (1.676 m)   Wt 173 lb (78.5 kg)   LMP 03/27/2020   SpO2 100% Comment: RA  BMI 27.92 kg/m   GEN: A/Ox3; pleasant , NAD, well nourished    HEENT:  [place]/AT,  , NOSE-clear, THROAT-clear, no lesions, no postnasal drip or exudate noted.  Class III MP airway  NECK:  Supple w/ fair ROM; no JVD; normal carotid impulses w/o bruits; no thyromegaly or nodules palpated; no  lymphadenopathy.    RESP  Clear  P & A; w/o, wheezes/ rales/ or rhonchi. no accessory muscle use, no dullness to percussion  CARD:  RRR, no m/r/g, no peripheral edema, pulses intact, no cyanosis or clubbing.  GI:   Soft & nt; nml bowel sounds; no organomegaly or masses detected.   Musco: Warm bil, no deformities or joint swelling noted.   Neuro: alert, no focal deficits noted.  Skin: Warm, no lesions or rashes    Lab Results:    BMET   BNP No results found for: "BNP"  ProBNP No results found for: "PROBNP"  Imaging: No results found.        No data to display          No results found for: "NITRICOXIDE"      Assessment & Plan:   Snoring Loud snoring, restless sleep, daytime sleepiness, sleep paralysis, high Epworth score all suspicious for underlying sleep apnea.  Patient has significant daytime sleepiness, vivid dreams and episodes of sleep paralysis.  Would like to get an in lab study however patient declines and wants to proceed with a home sleep study. Patient education given on sleep apnea-set up for home sleep study  - discussed how weight can impact sleep and risk for sleep disordered breathing - discussed options to assist with weight loss: combination of diet modification, cardiovascular and strength training exercises   - had an extensive discussion regarding the adverse health consequences related to untreated sleep disordered breathing - specifically discussed the risks for hypertension, coronary artery disease, cardiac dysrhythmias, cerebrovascular disease, and diabetes - lifestyle modification discussed   - discussed how sleep disruption can increase risk of accidents, particularly when driving - safe driving practices were discussed    Plan  Patient Instructions  Set up for home sleep study  Healthy sleep regimen  Do not drive if sleepy  Work on healthy weight.  Follow up in 3 month to discuss sleep results and treatment        Rexene Edison, NP 08/02/2022

## 2022-08-02 NOTE — Assessment & Plan Note (Signed)
Loud snoring, restless sleep, daytime sleepiness, sleep paralysis, high Epworth score all suspicious for underlying sleep apnea.  Patient has significant daytime sleepiness, vivid dreams and episodes of sleep paralysis.  Would like to get an in lab study however patient declines and wants to proceed with a home sleep study. Patient education given on sleep apnea-set up for home sleep study  - discussed how weight can impact sleep and risk for sleep disordered breathing - discussed options to assist with weight loss: combination of diet modification, cardiovascular and strength training exercises   - had an extensive discussion regarding the adverse health consequences related to untreated sleep disordered breathing - specifically discussed the risks for hypertension, coronary artery disease, cardiac dysrhythmias, cerebrovascular disease, and diabetes - lifestyle modification discussed   - discussed how sleep disruption can increase risk of accidents, particularly when driving - safe driving practices were discussed    Plan  Patient Instructions  Set up for home sleep study  Healthy sleep regimen  Do not drive if sleepy  Work on healthy weight.  Follow up in 3 month to discuss sleep results and treatment

## 2022-08-03 ENCOUNTER — Telehealth (HOSPITAL_COMMUNITY): Payer: Self-pay | Admitting: Psychiatry

## 2022-08-03 NOTE — Progress Notes (Signed)
Reviewed and agree with assessment/plan.   Chesley Mires, MD [facility] Pulmonary/Critical Care 08/03/2022, 9:20 AM Pager:  [phone]

## 2022-09-26 DIAGNOSIS — G4730 Sleep apnea, unspecified: Secondary | ICD-10-CM | POA: Diagnosis not present

## 2022-09-29 ENCOUNTER — Ambulatory Visit: Payer: Medicare HMO | Admitting: Internal Medicine
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
## 2022-10-04 ENCOUNTER — Ambulatory Visit: Payer: Medicare HMO | Admitting: Plastic Surgery

## 2022-10-04 ENCOUNTER — Ambulatory Visit: Payer: Medicare HMO | Admitting: Psychiatry

## 2022-10-04 ENCOUNTER — Encounter: Payer: Self-pay | Admitting: Plastic Surgery

## 2022-10-04 VITALS — BP 158/94 | HR 102 | Ht 66.0 in | Wt 163.8 lb

## 2022-10-04 DIAGNOSIS — N651 Disproportion of reconstructed breast: Secondary | ICD-10-CM

## 2022-10-04 DIAGNOSIS — F17210 Nicotine dependence, cigarettes, uncomplicated: Secondary | ICD-10-CM | POA: Diagnosis not present

## 2022-10-04 DIAGNOSIS — Z171 Estrogen receptor negative status [ER-]: Secondary | ICD-10-CM | POA: Diagnosis not present

## 2022-10-04 DIAGNOSIS — C50812 Malignant neoplasm of overlapping sites of left female breast: Secondary | ICD-10-CM | POA: Diagnosis not present

## 2022-10-04 NOTE — Progress Notes (Signed)
Patient ID: Cindy Brewer, female    DOB: October 22, 1970, 52 y.o.   MRN: [id number]   Chief Complaint  Patient presents with   Consult    The patient is a 52 yrs old female here for a consultation for breast reconstruction. She was diagnosed with malignant neoplasm of overlapping sites of the LEFT breast in 2019.  It was Stage IIB, ER/PR negative and HER-2 negative. She was treated with a left partial mastectomy but did not have radiation. She is 5 feet 6 inches tall and weighs 163 pounds.  She is at least 1 if not 2 cup sizes difference between the left and the right breast.  The sternal notch to nipple distance on the right side is 34 cm and on the left side is 25 cm.  She also has excess axillary breast tissue on both sides.  The left side is worse.  She is smoking and knows she needs to stop.  Her last mammogram was negative in November 2023. She has anxiety, anemia, arthritis, GERD and depression. She is an every day smoker and drinks alcohol.      Review of Systems  Constitutional: Negative.   Eyes: Negative.   Respiratory: Negative.  Negative for chest tightness.   Cardiovascular: Negative.   Gastrointestinal: Negative.   Endocrine: Negative.   Genitourinary: Negative.   Musculoskeletal: Negative.     Past Medical History:  Diagnosis Date   Anemia    Anxiety    Arthritis    right knee    Cancer 2019   BREAST- Left   Depression    Fibroid    GERD (gastroesophageal reflux disease)     Past Surgical History:  Procedure Laterality Date   BREAST BIOPSY Left 2019   IDC   BREAST LUMPECTOMY WITH AXILLARY LYMPH NODE BIOPSY Left 11/24/2017   Procedure: LEFT BREAST LUMPECTOMY WITH LEFT SENTINEL NODE LYMPH NODE BIOPSY;  Surgeon: Jovita Kussmaul, MD;  Location: [facility] ORS;  Service: General;  Laterality: Left;   CESAREAN SECTION     EYE SURGERY     DETACHED RETINA 4/519   HYSTEROSCOPY WITH D & C N/A 03/14/2019   Procedure: DILATATION AND CURETTAGE [name];  Surgeon: Will Bonnet, MD;  Location: [facility] ORS;  Service: Gynecology;  Laterality: N/A;   IR EMBO TUMOR ORGAN ISCHEMIA INFARCT INC GUIDE ROADMAPPING        Current Outpatient Medications:    amLODipine (NORVASC) 5 MG tablet, Take 1 tablet (5 mg total) by mouth daily., Disp: 90 tablet, Rfl: 1   ASHWAGANDHA PO, Take by mouth., Disp: , Rfl:    Black Cohosh 160 MG CAPS, Take by mouth., Disp: , Rfl:    Cyanocobalamin (VITAMIN B 12 PO), Take by mouth., Disp: , Rfl:    escitalopram (LEXAPRO) 5 MG tablet, TAKE 1 TABLET (5 MG TOTAL) BY MOUTH DAILY., Disp: 90 tablet, Rfl: 1   hydrochlorothiazide (HYDRODIURIL) 25 MG tablet, Take 1 tablet (25 mg total) by mouth daily., Disp: 90 tablet, Rfl: 1   Multiple Vitamin (MULTIVITAMIN WITH MINERALS) TABS tablet, Take 1 tablet by mouth daily., Disp: , Rfl:    naltrexone (DEPADE) 50 MG tablet, Take 0.5 tablets (25 mg total) by mouth at bedtime., Disp: 15 tablet, Rfl: 2   omeprazole (PRILOSEC) 20 MG capsule, Take 1 capsule (20 mg total) by mouth daily., Disp: 30 capsule, Rfl: 3   pyridOXINE (VITAMIN B-6) 100 MG tablet, Take 100 mg by mouth daily., Disp: , Rfl:  Thiamine HCl (VITAMIN B-1) 250 MG tablet, Take 250 mg by mouth daily., Disp: , Rfl:    Vitamin D, Ergocalciferol, (DRISDOL) 1.25 MG (50000 UNIT) CAPS capsule, TAKE 1 CAPSULE (50,000 UNITS TOTAL) BY MOUTH EVERY 7 (SEVEN) DAYS, Disp: 12 capsule, Rfl: 0 No current facility-administered medications for this visit.  Facility-Administered Medications Ordered in Other Visits:    0.9 %  sodium chloride infusion, , Intravenous, Continuous, Earlie Server, MD, Last Rate: 10 mL/hr at 01/11/19 1412, New Bag at 01/11/19 1412   Objective:   Vitals:   10/04/22 1324  BP: (!) 158/94  Pulse: (!) 102  SpO2: 96%    Physical Exam Vitals and nursing note reviewed.  Constitutional:      Appearance: Normal appearance.  HENT:     Head: Normocephalic and atraumatic.  Cardiovascular:     Rate and Rhythm:  Normal rate.     Pulses: Normal pulses.  Pulmonary:     Effort: Pulmonary effort is normal.  Chest:    Abdominal:     General: There is no distension.     Palpations: Abdomen is soft.  Skin:    General: Skin is warm.     Capillary Refill: Capillary refill takes less than 2 seconds.     Coloration: Skin is not jaundiced.  Neurological:     Mental Status: She is alert and oriented to person, place, and time.  Psychiatric:        Mood and Affect: Mood normal.        Behavior: Behavior normal.        Thought Content: Thought content normal.        Judgment: Judgment normal.     Assessment & Plan:  Malignant neoplasm of overlapping sites of left breast in female, estrogen receptor negative  The procedure the patient selected and that was best for the patient was discussed. The risk were discussed and include but not limited to the following:  Breast asymmetry, fluid accumulation, firmness of the breast, inability to breast feed, loss of nipple or areola, skin loss, change in skin and nipple sensation, fat necrosis of the breast tissue, bleeding, infection and healing delay.  There are risks of anesthesia and injury to nerves or blood vessels.  Allergic reaction to tape, suture and skin glue are possible.  There will be swelling.  Any of these can lead to the need for revisional surgery which is not included in this surgery.  A breast reduction has potential to interfere with diagnostic procedures in the future.  This procedure is best done when the breast is fully developed.  Changes in the breast will continue to occur over time: pregnancy, weight gain or weigh loss. No guarantees are given for a certain bra or breast size.    Total time: 40 minutes. This includes time spent with the patient during the visit as well as time spent before and after the visit reviewing the chart, documenting the encounter, ordering pertinent studies and literature for the patient.    The absolute safest  option would be to leave the left breast alone and only operate on the right side.  I realize she wants a reduction on both sides.  I think that it is possible but she does need to understand the risks which are high and real.  Reduction on both sides would then make me recommend make me recommend waiting on the axillary area because an extra incision could affect the blood supply especially to the left side.  The patient knows she has to be 3 months tobacco free.  She is going to work on that and then get back in touch with [place]. Pictures were obtained of the patient and placed in the chart with the patient's or guardian's permission.   [place], DO

## 2022-10-10 DIAGNOSIS — H18821 Corneal disorder due to contact lens, right eye: Secondary | ICD-10-CM | POA: Diagnosis not present

## 2022-10-10 DIAGNOSIS — H18211 Corneal edema secondary to contact lens, right eye: Secondary | ICD-10-CM | POA: Diagnosis not present

## 2022-10-11 DIAGNOSIS — H18821 Corneal disorder due to contact lens, right eye: Secondary | ICD-10-CM | POA: Diagnosis not present

## 2022-10-11 DIAGNOSIS — H18211 Corneal edema secondary to contact lens, right eye: Secondary | ICD-10-CM | POA: Diagnosis not present

## 2022-10-19 ENCOUNTER — Ambulatory Visit (INDEPENDENT_AMBULATORY_CARE_PROVIDER_SITE_OTHER): Payer: Medicare HMO

## 2022-10-19 ENCOUNTER — Telehealth: Payer: Self-pay | Admitting: Pulmonary Disease

## 2022-10-19 DIAGNOSIS — G4733 Obstructive sleep apnea (adult) (pediatric): Secondary | ICD-10-CM | POA: Diagnosis not present

## 2022-10-19 DIAGNOSIS — R0683 Snoring: Secondary | ICD-10-CM

## 2022-10-19 NOTE — Telephone Encounter (Signed)
Call patient  Sleep study result  Date of study: 09/26/2022  Impression: Moderate obstructive sleep apnea with mild oxygen desaturations  Recommendation: DME referral  Recommend CPAP therapy for moderate obstructive sleep apnea  Auto titrating CPAP with pressure settings of 5-20 will be appropriate  Encourage weight loss measures  Follow-up in the office 4 to 6 weeks following initiation of treatment

## 2022-10-20 NOTE — Telephone Encounter (Signed)
Spoke to pt and gave Tammy's interpretation of the sleep study results. Also gave pt appt with Tammy on Tues 10/25/22 at 3:00 PM. Pt verbalized understanding. Nothing further needed.

## 2022-10-20 NOTE — Telephone Encounter (Signed)
Sleep study shows moderate obstructive sleep apnea please set up office visit or virtual visit to discuss sleep study results and treatment plan.  Can be in person or virtual. Can double book my schedule over the next week to work in

## 2022-10-25 ENCOUNTER — Telehealth (INDEPENDENT_AMBULATORY_CARE_PROVIDER_SITE_OTHER): Payer: Medicare HMO | Admitting: Adult Health

## 2022-10-25 DIAGNOSIS — G4733 Obstructive sleep apnea (adult) (pediatric): Secondary | ICD-10-CM

## 2022-10-25 NOTE — Patient Instructions (Addendum)
Begin CPAP At bedtime , wear all night long.  Work on healthy weight loss  Do not drive if sleepy  Follow up in 3 months and As needed   

## 2022-10-25 NOTE — Progress Notes (Signed)
Virtual Visit via Video Note  I connected with Cindy Brewer on 10/25/22 at  3:00 PM EDT by a video enabled telemedicine application and verified that I am speaking with the correct person using two identifiers.  Location: Patient: Home  Provider: Office    I discussed the limitations of evaluation and management by telemedicine and the availability of in person appointments. The patient expressed understanding and agreed to proceed.  History of Present Illness: 52 year old female seen for sleep consult August 02, 2022 for snoring found to have moderate obstructive sleep apnea  Today's video visit is a follow-up visit to discuss sleep study results.  Patient was seen for a sleep consult in January for snoring and daytime sleepiness.  She was set up for home sleep study that was done September 26, 2022 that showed moderate sleep apnea with a AHI at 22.9/hour and SpO2 low at 88%.  We discussed her sleep study results in detail.  Patient has significant symptom burden.  Would like to proceed with CPAP therapy.  Past Medical History:  Diagnosis Date   Anemia    Anxiety    Arthritis    right knee    Cancer 2019   BREAST- Left   Depression    Fibroid    GERD (gastroesophageal reflux disease)     Current Outpatient Medications on File Prior to Visit  Medication Sig Dispense Refill   amLODipine (NORVASC) 5 MG tablet Take 1 tablet (5 mg total) by mouth daily. 90 tablet 1   ASHWAGANDHA PO Take by mouth.     Black Cohosh 160 MG CAPS Take by mouth.     Cyanocobalamin (VITAMIN B 12 PO) Take by mouth.     escitalopram (LEXAPRO) 5 MG tablet TAKE 1 TABLET (5 MG TOTAL) BY MOUTH DAILY. 90 tablet 1   hydrochlorothiazide (HYDRODIURIL) 25 MG tablet Take 1 tablet (25 mg total) by mouth daily. 90 tablet 1   Multiple Vitamin (MULTIVITAMIN WITH MINERALS) TABS tablet Take 1 tablet by mouth daily.     naltrexone (DEPADE) 50 MG tablet Take 0.5 tablets (25 mg total) by mouth at bedtime. 15 tablet 2    omeprazole (PRILOSEC) 20 MG capsule Take 1 capsule (20 mg total) by mouth daily. 30 capsule 3   pyridOXINE (VITAMIN B-6) 100 MG tablet Take 100 mg by mouth daily.     Thiamine HCl (VITAMIN B-1) 250 MG tablet Take 250 mg by mouth daily.     Vitamin D, Ergocalciferol, (DRISDOL) 1.25 MG (50000 UNIT) CAPS capsule TAKE 1 CAPSULE (50,000 UNITS TOTAL) BY MOUTH EVERY 7 (SEVEN) DAYS 12 capsule 0   Current Facility-Administered Medications on File Prior to Visit  Medication Dose Route Frequency Provider Last Rate Last Admin   0.9 %  sodium chloride infusion   Intravenous Continuous Rickard Patience, MD 10 mL/hr at 01/11/19 1412 New Bag at 01/11/19 1412        Observations/Objective: Appears well in no acute distress  Assessment and Plan: Moderate obstructive sleep apnea.  Patient education was given on sleep apnea.  We went over treatment options including weight loss, oral appliance and CPAP therapy.  Patient like to proceed with CPAP therapy will begin auto CPAP 5 to 15 cm H2O.  Plan  Patient Instructions  Begin CPAP At bedtime,  wear all night long.  Work on healthy weight loss  Do not drive if sleepy  Follow up in 3 months and As needed       Follow Up Instructions:  I discussed the assessment and treatment plan with the patient. The patient was provided an opportunity to ask questions and all were answered. The patient agreed with the plan and demonstrated an understanding of the instructions.   The patient was advised to call back or seek an in-person evaluation if the symptoms worsen or if the condition fails to improve as anticipated.  I provided 22  minutes of non-face-to-face time during this encounter.   Rubye Oaks, NP

## 2022-10-26 NOTE — Progress Notes (Signed)
Reviewed and agree with assessment/plan.   Sascha Baugher, MD [place] Pulmonary/Critical Care 10/26/2022, 9:03 AM Pager:  [phone]  

## 2022-11-15 DIAGNOSIS — R002 Palpitations: Secondary | ICD-10-CM | POA: Diagnosis not present

## 2022-11-15 DIAGNOSIS — F17209 Nicotine dependence, unspecified, with unspecified nicotine-induced disorders: Secondary | ICD-10-CM | POA: Diagnosis not present

## 2022-11-15 DIAGNOSIS — E782 Mixed hyperlipidemia: Secondary | ICD-10-CM | POA: Diagnosis not present

## 2022-11-15 DIAGNOSIS — R5381 Other malaise: Secondary | ICD-10-CM | POA: Diagnosis not present

## 2022-11-15 DIAGNOSIS — R9431 Abnormal electrocardiogram [ECG] [EKG]: Secondary | ICD-10-CM | POA: Diagnosis not present

## 2022-11-15 DIAGNOSIS — I10 Essential (primary) hypertension: Secondary | ICD-10-CM | POA: Diagnosis not present

## 2022-11-15 DIAGNOSIS — F419 Anxiety disorder, unspecified: Secondary | ICD-10-CM | POA: Diagnosis not present

## 2022-11-15 DIAGNOSIS — R5383 Other fatigue: Secondary | ICD-10-CM | POA: Diagnosis not present

## 2022-12-06 ENCOUNTER — Inpatient Hospital Stay: Payer: Medicare HMO | Attending: Oncology

## 2022-12-06 ENCOUNTER — Ambulatory Visit (INDEPENDENT_AMBULATORY_CARE_PROVIDER_SITE_OTHER): Payer: Medicare HMO | Admitting: Internal Medicine

## 2022-12-06 VITALS — BP 154/94 | HR 94 | Temp 97.9°F | Resp 18 | Ht 66.0 in | Wt 162.6 lb

## 2022-12-06 DIAGNOSIS — F17210 Nicotine dependence, cigarettes, uncomplicated: Secondary | ICD-10-CM | POA: Insufficient documentation

## 2022-12-06 DIAGNOSIS — Z853 Personal history of malignant neoplasm of breast: Secondary | ICD-10-CM | POA: Insufficient documentation

## 2022-12-06 DIAGNOSIS — Z79899 Other long term (current) drug therapy: Secondary | ICD-10-CM | POA: Insufficient documentation

## 2022-12-06 DIAGNOSIS — G44209 Tension-type headache, unspecified, not intractable: Secondary | ICD-10-CM

## 2022-12-06 DIAGNOSIS — Z171 Estrogen receptor negative status [ER-]: Secondary | ICD-10-CM | POA: Diagnosis not present

## 2022-12-06 DIAGNOSIS — R220 Localized swelling, mass and lump, head: Secondary | ICD-10-CM

## 2022-12-06 DIAGNOSIS — E538 Deficiency of other specified B group vitamins: Secondary | ICD-10-CM | POA: Diagnosis not present

## 2022-12-06 LAB — COMPREHENSIVE METABOLIC PANEL
ALT: 19 U/L (ref 0–44)
AST: 26 U/L (ref 15–41)
Albumin: 4.2 g/dL (ref 3.5–5.0)
Alkaline Phosphatase: 48 U/L (ref 38–126)
Anion gap: 9 (ref 5–15)
BUN: 17 mg/dL (ref 6–20)
CO2: 23 mmol/L (ref 22–32)
Calcium: 9.2 mg/dL (ref 8.9–10.3)
Chloride: 106 mmol/L (ref 98–111)
Creatinine, Ser: 0.78 mg/dL (ref 0.44–1.00)
GFR, Estimated: >60 mL/min (ref >60)
Glucose, Bld: 99 mg/dL (ref 70–99)
Potassium: 3.5 mmol/L (ref 3.5–5.1)
Sodium: 138 mmol/L (ref 135–145)
Total Bilirubin: 0.7 mg/dL (ref 0.3–1.2)
Total Protein: 7.8 g/dL (ref 6.5–8.1)

## 2022-12-06 LAB — CBC WITH DIFFERENTIAL/PLATELET
Abs Immature Granulocytes: 0.02 10*3/uL (ref 0.00–0.07)
Basophils Absolute: 0 10*3/uL (ref 0.0–0.1)
Basophils Relative: 1 %
Eosinophils Absolute: 0.1 10*3/uL (ref 0.0–0.5)
Eosinophils Relative: 2 %
HCT: 39.1 % (ref 36.0–46.0)
Hemoglobin: 13.2 g/dL (ref 12.0–15.0)
Immature Granulocytes: 0 %
Lymphocytes Relative: 22 %
Lymphs Abs: 1.2 10*3/uL (ref 0.7–4.0)
MCH: 34.9 pg — ABNORMAL HIGH (ref 26.0–34.0)
MCHC: 33.8 g/dL (ref 30.0–36.0)
MCV: 103.4 fL — ABNORMAL HIGH (ref 80.0–100.0)
Monocytes Absolute: 0.5 10*3/uL (ref 0.1–1.0)
Monocytes Relative: 8 %
Neutro Abs: 3.8 10*3/uL (ref 1.7–7.7)
Neutrophils Relative %: 67 %
Platelets: 211 10*3/uL (ref 150–400)
RBC: 3.78 MIL/uL — ABNORMAL LOW (ref 3.87–5.11)
RDW: 12.7 % (ref 11.5–15.5)
WBC: 5.6 10*3/uL (ref 4.0–10.5)
nRBC: 0 % (ref 0.0–0.2)

## 2022-12-06 LAB — FOLATE: Folate: 5.2 ng/mL — ABNORMAL LOW (ref >5.9)

## 2022-12-06 MED ORDER — TIZANIDINE HCL 4 MG PO TABS: 4.0000 mg | ORAL_TABLET | Freq: Four times a day (QID) | ORAL | 0 refills | Status: DC | PRN
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
## 2022-12-07 LAB — VITAMIN B12: Vitamin B-12: 173 pg/mL — ABNORMAL LOW (ref 180–914)

## 2022-12-07 LAB — CANCER ANTIGEN 27.29: CA 27.29: 17 U/mL (ref 0.0–38.6)

## 2022-12-08 ENCOUNTER — Encounter: Payer: Self-pay | Admitting: Oncology

## 2022-12-08 ENCOUNTER — Inpatient Hospital Stay (HOSPITAL_BASED_OUTPATIENT_CLINIC_OR_DEPARTMENT_OTHER): Payer: Medicare HMO | Admitting: Oncology

## 2022-12-08 VITALS — BP 173/121 | HR 83 | Temp 98.2°F | Resp 18 | Wt 161.7 lb

## 2022-12-08 DIAGNOSIS — Z720 Tobacco use: Secondary | ICD-10-CM

## 2022-12-08 DIAGNOSIS — Z79899 Other long term (current) drug therapy: Secondary | ICD-10-CM | POA: Diagnosis not present

## 2022-12-08 DIAGNOSIS — C50812 Malignant neoplasm of overlapping sites of left female breast: Secondary | ICD-10-CM | POA: Diagnosis not present

## 2022-12-08 DIAGNOSIS — Z171 Estrogen receptor negative status [ER-]: Secondary | ICD-10-CM

## 2022-12-08 DIAGNOSIS — F17210 Nicotine dependence, cigarettes, uncomplicated: Secondary | ICD-10-CM | POA: Diagnosis not present

## 2022-12-08 DIAGNOSIS — D7589 Other specified diseases of blood and blood-forming organs: Secondary | ICD-10-CM

## 2022-12-08 DIAGNOSIS — E538 Deficiency of other specified B group vitamins: Secondary | ICD-10-CM | POA: Diagnosis not present

## 2022-12-08 DIAGNOSIS — Z853 Personal history of malignant neoplasm of breast: Secondary | ICD-10-CM | POA: Diagnosis not present

## 2022-12-08 LAB — CANCER ANTIGEN 15-3: CA 15-3: 16.3 U/mL (ref 0.0–25.0)

## 2022-12-08 MED ORDER — FOLIC ACID 1 MG PO TABS: 1.0000 mg | ORAL_TABLET | Freq: Every day | ORAL | 1 refills | Status: DC

## 2022-12-08 MED ORDER — VITAMIN B-12 1000 MCG PO TABS: 1000.0000 ug | ORAL_TABLET | Freq: Every day | ORAL | 1 refills | Status: AC

## 2022-12-08 NOTE — Assessment & Plan Note (Signed)
Recommend for folic acid 1 mg daily.

## 2022-12-08 NOTE — Assessment & Plan Note (Signed)
Recommend patient to start vitamin B12 1000mcg daily.  

## 2022-12-08 NOTE — Progress Notes (Signed)
Hematology/Oncology Progress note [phone] [phone]      Patient Care Team: Margarita Mail, DO as PCP - General (Internal Medicine) Patient, No Pcp Per (General Practice) Rickard Patience, MD as Consulting Physician (Hematology and Oncology)  ASSESSMENT & PLAN:   Cancer Staging  Malignant neoplasm of overlapping sites of left breast in female, estrogen receptor negative (HCC) Staging form: Breast, AJCC 8th Edition - Clinical stage from 08/25/2017: Stage IIB (cT2(2), cN0, cM0, G3, ER-, PR-, HER2-) - Signed by Rickard Patience, MD on 08/25/2017  Malignant neoplasm of overlapping sites of left breast in female, estrogen receptor negative (HCC) # 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  Labs reviewed and discussed patient.  Cancer markers are within normal limits. Continue annual mammogram surveillance. -Due in November 2024. Recommend patient to continue calcium and vitamin D supplementation.   Patient desires breast reduction, refer to plastic surgery  Macrocytosis without anemia Recommend patient to decrease alcohol intake.  This is due to B12 and folate deficiency and alcohol use.  B12 deficiency Recommend patient to start vitamin B12 1000 mcg daily.  Folate deficiency Recommend for folic acid 1 mg daily.  Tobacco use Smoke cessation was discussed with patient.  Orders Placed This Encounter  Procedures   MM 3D SCREENING MAMMOGRAM BILATERAL BREAST    Standing Status:   Future    Standing Expiration Date:   12/08/2023    Order Specific Question:   Reason for Exam (SYMPTOM  OR DIAGNOSIS REQUIRED)    Answer:   Breast cancer    Order Specific Question:   Preferred imaging location?    Answer:   [facility]    Order Specific Question:   Is the patient pregnant?    Answer:   No   CBC with Differential (Cancer Center Only)    Standing Status:   Future    Standing Expiration Date:    12/08/2023   CMP (Cancer Center only)    Standing Status:   Future    Standing Expiration Date:   12/08/2023   Folate    Standing Status:   Future    Standing Expiration Date:   12/08/2023   Vitamin B12    Standing Status:   Future    Standing Expiration Date:   12/08/2023   Cancer antigen 27.29    Standing Status:   Future    Standing Expiration Date:   12/08/2023   Cancer antigen 15-3    Standing Status:   Future    Standing Expiration Date:   12/08/2023   Follow up in 6 months.   All questions were answered. The patient knows to call the clinic with any problems, questions or concerns.  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 two separate foci of invasive mammary carcinoma with associated tumor necrosis, DCIS present, fibroadenoma 12mm, Foci 1 is 40mm, and foci 2 is 35mm, margins are negative. Sentinel LN 0/0 involved. Negative LVI declined neoadjuvant or adjuvant chemotherapy  # Genetic testing: BRCA neg Testing did not reveal a pathogenic mutation in any of the genes analyzed.  #Iron deficiency anemia status post multiple IV iron infusions. # Uterus Fibroid disease;  underwent endometrial biopsy establish care with  GYN Dr. Jean Rosenthal and had D&C, polypectomy, myomectomy   # Thyroid nodule, previously ordered thyroid ultrasound not done.  05/31/21 Bilateral mammogram showed no mammographic evidence of malignancy   INTERVAL HISTORY Cindy Brewer is a 52 y.o. female who has above history reviewed by me today presents for follow up visit for Triple negative breast cancer  Patient reports feeling well.   She smokes 1 cigar day.  She drinks alcohol .  Review of Systems  Constitutional:  Negative for  appetite change, chills, fatigue and fever.  HENT:   Negative for hearing loss and voice change.   Eyes:  Negative for eye problems.  Respiratory:  Negative for chest tightness, cough and shortness of breath.   Cardiovascular:  Negative for chest pain.  Gastrointestinal:  Negative for abdominal distention, abdominal pain and blood in stool.  Endocrine: Negative for hot flashes.  Genitourinary:  Negative for difficulty urinating and frequency.   Musculoskeletal:  Negative for arthralgias.  Skin:  Negative for itching and rash.  Neurological:  Negative for extremity weakness.  Hematological:  Negative for adenopathy.  Psychiatric/Behavioral:  Negative for confusion.      MEDICAL HISTORY:  Past Medical History:  Diagnosis Date   Anemia    Anxiety    Arthritis    right knee    Cancer (HCC) 2019   BREAST- Left   Depression    Fibroid    GERD (gastroesophageal reflux disease)     SURGICAL HISTORY: Past Surgical History:  Procedure Laterality Date   BREAST BIOPSY Left 2019   IDC   BREAST LUMPECTOMY WITH AXILLARY LYMPH NODE BIOPSY Left 11/24/2017   Procedure: LEFT BREAST LUMPECTOMY WITH LEFT SENTINEL NODE LYMPH NODE BIOPSY;  Surgeon: Griselda Miner, MD;  Location: [facility] ORS;  Service: General;  Laterality: Left;   CESAREAN SECTION     EYE SURGERY     DETACHED RETINA 4/519   HYSTEROSCOPY WITH D & C N/A 03/14/2019   Procedure: DILATATION AND CURETTAGE Matt Holmes;  Surgeon: Conard Novak, MD;  Location: [facility] ORS;  Service: Gynecology;  Laterality: N/A;   IR EMBO TUMOR ORGAN ISCHEMIA INFARCT INC GUIDE ROADMAPPING      SOCIAL HISTORY: Social History   Socioeconomic History   Marital status: Divorced    Spouse name: Not on file   Number of children: 5   Years of education: Not on file   Highest education level: Some college, no degree  Occupational History   Not on file  Tobacco Use   Smoking status: Former    Packs/day: 0.25    Years: 13.00     Additional pack years: 0.00    Total pack years: 3.25    Types: Cigarettes    Quit date: 05/04/2022    Years since quitting: 0.5   Smokeless tobacco: Never  Vaping Use   Vaping Use: Never used  Substance and Sexual Activity   Alcohol use: Yes    Comment: weekends   Drug use: No   Sexual activity: Not Currently    Birth control/protection: None  Other Topics Concern   Not on file  Social History Narrative   ** Merged History Encounter **       Social Determinants of Health   Financial Resource Strain: Patient Declined (12/06/2022)   Overall Financial Resource Strain (CARDIA)    Difficulty of Paying Living Expenses: Patient declined  Food Insecurity: No Food Insecurity (12/06/2022)   Hunger Vital Sign    Worried About Running Out of Food in  the Last Year: Never true    Ran Out of Food in the Last Year: Never true  Transportation Needs: No Transportation Needs (12/06/2022)   PRAPARE - Administrator, Civil Service (Medical): No    Lack of Transportation (Non-Medical): No  Physical Activity: Insufficiently Active (12/06/2022)   Exercise Vital Sign    Days of Exercise per Week: 2 days    Minutes of Exercise per Session: 30 min  Stress: No Stress Concern Present (12/06/2022)   [employer] of Occupational Health - Occupational Stress Questionnaire    Feeling of Stress : Only a little  Social Connections: Unknown (12/06/2022)   Social Connection and Isolation Panel [NHANES]    Frequency of Communication with Friends and Family: Once a week    Frequency of Social Gatherings with Friends and Family: Never    Attends Religious Services: Patient declined    Database administrator or Organizations: No    Attends Engineer, structural: Not on file    Marital Status: Divorced  Intimate Partner Violence: Not At Risk (06/02/2022)   Humiliation, Afraid, Rape, and Kick questionnaire    Fear of Current or Ex-Partner: No    Emotionally Abused: No    Physically Abused: No     Sexually Abused: No    FAMILY HISTORY: Family History  Problem Relation Age of Onset   Kidney disease Mother    Heart disease Mother    Stroke Mother    Hepatitis C Mother    Dementia Father    Blindness Father    Kidney disease Sister    Schizophrenia Brother    Hypertension Brother    Diabetes Brother    Schizophrenia Paternal Aunt    Suicidality Paternal Aunt    Stroke Maternal Grandmother    Breast cancer Neg Hx     ALLERGIES:  is allergic to demerol [meperidine], oxycodone, sulfa antibiotics, and percocet [oxycodone-acetaminophen].  MEDICATIONS:  Current Outpatient Medications  Medication Sig Dispense Refill   amLODipine (NORVASC) 5 MG tablet Take 1 tablet (5 mg total) by mouth daily. 90 tablet 1   ASHWAGANDHA PO Take by mouth.     Black Cohosh 160 MG CAPS Take by mouth.     cyanocobalamin (VITAMIN B12) 1000 MCG tablet Take 1 tablet (1,000 mcg total) by mouth daily. 90 tablet 1   escitalopram (LEXAPRO) 5 MG tablet TAKE 1 TABLET (5 MG TOTAL) BY MOUTH DAILY. 90 tablet 1   folic acid (FOLVITE) 1 MG tablet Take 1 tablet (1 mg total) by mouth daily. 90 tablet 1   hydrochlorothiazide (HYDRODIURIL) 25 MG tablet Take 1 tablet (25 mg total) by mouth daily. 90 tablet 1   Multiple Vitamin (MULTIVITAMIN WITH MINERALS) TABS tablet Take 1 tablet by mouth daily.     omeprazole (PRILOSEC) 20 MG capsule Take 1 capsule (20 mg total) by mouth daily. 30 capsule 3   pyridOXINE (VITAMIN B-6) 100 MG tablet Take 100 mg by mouth daily.     Thiamine HCl (VITAMIN B-1) 250 MG tablet Take 250 mg by mouth daily.     tiZANidine (ZANAFLEX) 4 MG tablet Take 1 tablet (4 mg total) by mouth every 6 (six) hours as needed for muscle spasms. 30 tablet 0   Vitamin D, Ergocalciferol, (DRISDOL) 1.25 MG (50000 UNIT) CAPS capsule TAKE 1 CAPSULE (50,000 UNITS TOTAL) BY MOUTH EVERY 7 (SEVEN) DAYS 12 capsule 0   No current facility-administered medications for this visit.   Facility-Administered Medications  Ordered in Other Visits  Medication Dose Route Frequency Provider Last Rate Last Admin   0.9 %  sodium chloride infusion   Intravenous Continuous Rickard Patience, MD 10 mL/hr at 01/11/19 1412 New Bag at 01/11/19 1412     PHYSICAL EXAMINATION: ECOG PERFORMANCE STATUS: 0 - Asymptomatic Vitals:   12/08/22 1349  BP: (!) 173/121  Pulse: 83  Resp: 18  Temp: 98.2 F (36.8 C)   Filed Weights   12/08/22 1349  Weight: 161 lb 11.2 oz (73.3 kg)    Physical Exam Constitutional:      General: She is not in acute distress.    Appearance: She is not diaphoretic.  HENT:     Head: Normocephalic.     Nose: Nose normal.     Mouth/Throat:     Pharynx: No oropharyngeal exudate.  Eyes:     General: No scleral icterus.    Pupils: Pupils are equal, round, and reactive to light.  Neck:     Vascular: No JVD.  Cardiovascular:     Rate and Rhythm: Normal rate and regular rhythm.  Pulmonary:     Effort: Pulmonary effort is normal. No respiratory distress.     Breath sounds: Normal breath sounds. No wheezing.  Abdominal:     General: Bowel sounds are normal. There is no distension.     Palpations: Abdomen is soft. There is no mass.     Tenderness: There is no abdominal tenderness.  Musculoskeletal:        General: No tenderness. Normal range of motion.     Cervical back: Normal range of motion.  Lymphadenopathy:     Cervical: No cervical adenopathy.  Skin:    General: Skin is warm and dry.     Coloration: Skin is not pale.     Findings: No erythema or rash.  Neurological:     Mental Status: She is alert and oriented to person, place, and time. Mental status is at baseline.     Cranial Nerves: No cranial nerve deficit.     Motor: No abnormal muscle tone.     Coordination: Coordination normal.  Psychiatric:        Mood and Affect: Mood and affect normal.        Cognition and Memory: Memory normal.         LABORATORY DATA:  I have reviewed the data as listed     Latest Ref Rng & Units  12/06/2022    1:11 PM 06/07/2022    1:40 PM 12/07/2021    1:32 PM  CBC  WBC 4.0 - 10.5 K/uL 5.6  6.4  8.0   Hemoglobin 12.0 - 15.0 g/dL 96.0  45.4  09.8   Hematocrit 36.0 - 46.0 % 39.1  42.3  43.9   Platelets 150 - 400 K/uL 211  271  281       Latest Ref Rng & Units 12/06/2022    1:11 PM 06/07/2022    1:40 PM 03/03/2022    1:45 PM  CMP  Glucose 70 - 99 mg/dL 99  119  147   BUN 6 - 20 mg/dL 17  14  15    Creatinine 0.44 - 1.00 mg/dL 8.29  5.62  1.30   Sodium 135 - 145 mmol/L 138  140  144   Potassium 3.5 - 5.1 mmol/L 3.5  3.7  5.0   Chloride 98 - 111 mmol/L 106  107  109   CO2 22 - 32 mmol/L 23  24  26    Calcium 8.9 -  10.3 mg/dL 9.2  9.1  9.9   Total Protein 6.5 - 8.1 g/dL 7.8  7.9  7.0   Total Bilirubin 0.3 - 1.2 mg/dL 0.7  0.5  0.4   Alkaline Phos 38 - 126 U/L 48  54    AST 15 - 41 U/L 26  24  32   ALT 0 - 44 U/L 19  22  32

## 2022-12-08 NOTE — Progress Notes (Signed)
Pt here follow up. No new breast problems. BP is elevated and pt states she has not taken BP medication today.

## 2022-12-08 NOTE — Assessment & Plan Note (Signed)
Smoke cessation was discussed with patient. 

## 2022-12-08 NOTE — Assessment & Plan Note (Addendum)
Recommend patient to decrease alcohol intake.  This is due to B12 and folate deficiency and alcohol use.

## 2022-12-08 NOTE — Assessment & Plan Note (Addendum)
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  Labs reviewed and discussed patient.  Cancer markers are within normal limits. Continue annual mammogram surveillance. -Due in November 2024. Recommend patient to continue calcium and vitamin D supplementation.   Patient desires breast reduction, refer to plastic surgery
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
## 2022-12-15 ENCOUNTER — Encounter: Payer: Self-pay | Admitting: Psychiatry

## 2022-12-15 ENCOUNTER — Ambulatory Visit (INDEPENDENT_AMBULATORY_CARE_PROVIDER_SITE_OTHER): Payer: Medicare HMO | Admitting: Psychiatry

## 2022-12-15 VITALS — BP 181/129 | HR 76 | Temp 97.4°F | Ht 66.0 in | Wt 162.4 lb

## 2022-12-15 DIAGNOSIS — F4310 Post-traumatic stress disorder, unspecified: Secondary | ICD-10-CM | POA: Diagnosis not present

## 2022-12-15 DIAGNOSIS — F331 Major depressive disorder, recurrent, moderate: Secondary | ICD-10-CM | POA: Diagnosis not present

## 2022-12-15 DIAGNOSIS — F1090 Alcohol use, unspecified, uncomplicated: Secondary | ICD-10-CM

## 2022-12-15 MED ORDER — ESCITALOPRAM OXALATE 20 MG PO TABS: 20.0000 mg | ORAL_TABLET | Freq: Every day | ORAL | 1 refills | Status: DC

## 2022-12-15 MED ORDER — NALTREXONE HCL 50 MG PO TABS: 25.0000 mg | ORAL_TABLET | Freq: Every day | ORAL | 1 refills | Status: AC

## 2022-12-19 ENCOUNTER — Ambulatory Visit
Admission: RE | Admit: 2022-12-19 | Discharge: 2022-12-19 | Disposition: A | Discharge status: Home / Self Care | Payer: Medicare HMO | Source: Ambulatory Visit | Attending: Internal Medicine | Admitting: Internal Medicine

## 2022-12-19 DIAGNOSIS — R220 Localized swelling, mass and lump, head: Secondary | ICD-10-CM | POA: Diagnosis not present

## 2022-12-19 DIAGNOSIS — R221 Localized swelling, mass and lump, neck: Secondary | ICD-10-CM | POA: Diagnosis not present

## 2023-01-02 DIAGNOSIS — F331 Major depressive disorder, recurrent, moderate: Secondary | ICD-10-CM | POA: Diagnosis not present

## 2023-01-03 ENCOUNTER — Ambulatory Visit (INDEPENDENT_AMBULATORY_CARE_PROVIDER_SITE_OTHER): Payer: Medicare HMO | Admitting: Plastic Surgery

## 2023-01-03 ENCOUNTER — Encounter: Payer: Self-pay | Admitting: Plastic Surgery

## 2023-01-03 ENCOUNTER — Encounter: Payer: Self-pay | Admitting: Psychiatry

## 2023-01-03 DIAGNOSIS — F1090 Alcohol use, unspecified, uncomplicated: Secondary | ICD-10-CM

## 2023-01-03 DIAGNOSIS — H5213 Myopia, bilateral: Secondary | ICD-10-CM | POA: Diagnosis not present

## 2023-01-03 DIAGNOSIS — N6489 Other specified disorders of breast: Secondary | ICD-10-CM

## 2023-01-03 DIAGNOSIS — N651 Disproportion of reconstructed breast: Secondary | ICD-10-CM

## 2023-01-03 DIAGNOSIS — C50812 Malignant neoplasm of overlapping sites of left female breast: Secondary | ICD-10-CM

## 2023-01-03 DIAGNOSIS — Z171 Estrogen receptor negative status [ER-]: Secondary | ICD-10-CM

## 2023-01-03 DIAGNOSIS — F17210 Nicotine dependence, cigarettes, uncomplicated: Secondary | ICD-10-CM | POA: Diagnosis not present

## 2023-01-03 DIAGNOSIS — F17213 Nicotine dependence, cigarettes, with withdrawal: Secondary | ICD-10-CM

## 2023-01-03 LAB — TSH: TSH: 0.34 u[IU]/mL — ABNORMAL LOW (ref 0.450–4.500)

## 2023-01-03 NOTE — Progress Notes (Signed)
Her thyroid level is lower than the normal range. I recommend she contacts her primary care provider for further evaluation. Additionally, could you contact [facility] to see if we can add a free T4 test to the blood sample from yesterday? Thanks.

## 2023-01-03 NOTE — Progress Notes (Signed)
   Subjective:    Patient ID: Cindy Brewer, female    DOB: [birth year], 52 y.o.   MRN: [id number]  HPI The patient is a 52 year old female here for further discussion about her breast reduction for symmetry.  She had left breast cancer in 2019.  It was estrogen and progesterone negative and HER2 negative.  She underwent a left partial mastectomy but did not have radiation.  She has significant asymmetry between her breasts that equate to either 1 to 2 cup size difference in this.  She is 5 feet 6 inches tall and weighs 163 pounds.  Her nipple to sternal notch distance on the right is 34 cm and on the left 25 cm.  I think she is really wanting to have the surgery because it is very uncomfortable and it is awkward to fit into close.  However she is still smoking and drinking daily.   Review of Systems  Constitutional: Negative.   Eyes: Negative.   Respiratory: Negative.    Cardiovascular: Negative.   Gastrointestinal: Negative.   Endocrine: Negative.   Genitourinary: Negative.   Musculoskeletal:  Positive for back pain.       Objective:   Physical Exam Constitutional:      Appearance: Normal appearance.  HENT:     Head: Normocephalic and atraumatic.  Cardiovascular:     Rate and Rhythm: Normal rate.  Pulmonary:     Effort: Pulmonary effort is normal.  Musculoskeletal:        General: No swelling or deformity.  Skin:    General: Skin is warm.     Capillary Refill: Capillary refill takes less than 2 seconds.     Coloration: Skin is not jaundiced.     Findings: No bruising.  Neurological:     Mental Status: She is alert and oriented to person, place, and time.  Psychiatric:        Mood and Affect: Mood normal.        Behavior: Behavior normal.        Thought Content: Thought content normal.        Judgment: Judgment normal.        Assessment & Plan:     ICD-10-CM   1. Malignant neoplasm of overlapping sites of left breast in female, estrogen receptor negative  (HCC)  C50.812    Z17.1     2. Postoperative breast asymmetry  N64.89     3. Cigarette nicotine dependence with withdrawal  F17.213        Recommend the patient stop smoking completely for 3 months prior to the surgery and cut back on her drinking.  She is aware to let us know when she has quit smoking and then we will proceed with surgical plans for right-sided mastopexy reduction with liposuction bilaterally to the lateral breast axillary area.

## 2023-01-03 NOTE — Progress Notes (Signed)
Spoke to patient about lab result she voiced understanding and stated that she would contact her PCP to further evaluated her thyroid level. Also called [facility] to add free T4 test no answer left voicemail for someone at [facility] to return my call.

## 2023-01-04 ENCOUNTER — Encounter: Payer: Self-pay | Admitting: Nurse Practitioner

## 2023-01-04 ENCOUNTER — Ambulatory Visit: Payer: Medicare HMO | Attending: Nurse Practitioner

## 2023-01-04 ENCOUNTER — Ambulatory Visit (INDEPENDENT_AMBULATORY_CARE_PROVIDER_SITE_OTHER): Payer: Medicare HMO | Admitting: Nurse Practitioner

## 2023-01-04 VITALS — BP 144/90 | HR 100 | Temp 98.0°F | Resp 16 | Ht 66.0 in | Wt 156.6 lb

## 2023-01-04 DIAGNOSIS — I10 Essential (primary) hypertension: Secondary | ICD-10-CM

## 2023-01-04 DIAGNOSIS — R002 Palpitations: Secondary | ICD-10-CM

## 2023-01-04 DIAGNOSIS — R7989 Other specified abnormal findings of blood chemistry: Secondary | ICD-10-CM

## 2023-01-04 MED ORDER — METOPROLOL TARTRATE 25 MG PO TABS
25.0000 mg | ORAL_TABLET | Freq: Two times a day (BID) | ORAL | 0 refills | Status: DC
Start: 2023-01-04 — End: 2023-01-26

## 2023-01-04 NOTE — Progress Notes (Signed)
BP (!) 144/90   Pulse 100   Temp 98 F (36.7 C) (Oral)   Resp 16   Ht 5\' 6"  (1.676 m)   Wt 156 lb 9.6 oz (71 kg)   LMP 03/27/2020   SpO2 94%   BMI 25.28 kg/m    Subjective:    Patient ID: Cindy Brewer, female    DOB: 04-22-1971, 52 y.o.   MRN: [id number]  HPI: Cindy Brewer is a 52 y.o. female  Chief Complaint  Patient presents with   Results    Low TSH lab per Dr. Jefm Petty wants pt to discuss    Abnormal labs/palpitations:Behavioral health provider did labs and TSH came back 0.340 on 01/02/2023. Patient reports she has been having palpitations, weight loss. Will get complete thyroid panel. Will get EKG, and order zio patch. Recently had cbc and cmp which were unremarkable.     Hypertension:  -Medications: hydrochlorothiazide 25 mg daily, amlodipine 5 mg daily, start metoprolol 25 mg BID -Patient is compliant with above medications and reports no side effects. -Checking BP at home (average): 150/90 -Denies any SOB, CP, vision changes, LE edema or symptoms of hypotension, reports palpitations -Diet: recommend dash diet -Exercise: increase physical activity as tolerated   Relevant past medical, surgical, family and social history reviewed and updated as indicated. Interim medical history since our last visit reviewed. Allergies and medications reviewed and updated.  Review of Systems  Constitutional: Negative for fever or weight change.  Respiratory: Negative for cough and shortness of breath.   Cardiovascular: Negative for chest pain, positive for palpitations.  Gastrointestinal: Negative for abdominal pain, no bowel changes.  Musculoskeletal: Negative for gait problem or joint swelling.  Skin: Negative for rash.  Neurological: Negative for dizziness or headache.  No other specific complaints in a complete review of systems (except as listed in HPI above).      Objective:    BP (!) 144/90   Pulse 100   Temp 98 F (36.7 C) (Oral)   Resp 16   Ht  5\' 6"  (1.676 m)   Wt 156 lb 9.6 oz (71 kg)   LMP 03/27/2020   SpO2 94%   BMI 25.28 kg/m   Wt Readings from Last 3 Encounters:  01/04/23 156 lb 9.6 oz (71 kg)  12/08/22 161 lb 11.2 oz (73.3 kg)  12/06/22 162 lb 9.6 oz (73.8 kg)    Physical Exam  Constitutional: Patient appears well-developed and well-nourished.  No distress.  HEENT: head atraumatic, normocephalic, pupils equal and reactive to light, neck supple, throat within normal limits Cardiovascular: Normal rate, regular rhythm and normal heart sounds.  No murmur heard. No BLE edema. Pulmonary/Chest: Effort normal and breath sounds normal. No respiratory distress. Abdominal: Soft.  There is no tenderness. Psychiatric: Patient has a normal mood and affect. behavior is normal. Judgment and thought content normal.  Results for orders placed or performed in visit on 12/06/22  Cancer antigen 15-3  Result Value Ref Range   CA 15-3 16.3 0.0 - 25.0 U/mL  Cancer antigen 27.29  Result Value Ref Range   CA 27.29 17.0 0.0 - 38.6 U/mL  Folate  Result Value Ref Range   Folate 5.2 (L) >5.9 ng/mL  Vitamin B12  Result Value Ref Range   Vitamin B-12 173 (L) 180 - 914 pg/mL  Comprehensive metabolic panel  Result Value Ref Range   Sodium 138 135 - 145 mmol/L   Potassium 3.5 3.5 - 5.1 mmol/L   Chloride 106 98 - 111 mmol/L  CO2 23 22 - 32 mmol/L   Glucose, Bld 99 70 - 99 mg/dL   BUN 17 6 - 20 mg/dL   Creatinine, Ser 1.61 0.44 - 1.00 mg/dL   Calcium 9.2 8.9 - 09.6 mg/dL   Total Protein 7.8 6.5 - 8.1 g/dL   Albumin 4.2 3.5 - 5.0 g/dL   AST 26 15 - 41 U/L   ALT 19 0 - 44 U/L   Alkaline Phosphatase 48 38 - 126 U/L   Total Bilirubin 0.7 0.3 - 1.2 mg/dL   GFR, Estimated >04 >54 mL/min   Anion gap 9 5 - 15  CBC with Differential/Platelet  Result Value Ref Range   WBC 5.6 4.0 - 10.5 K/uL   RBC 3.78 (L) 3.87 - 5.11 MIL/uL   Hemoglobin 13.2 12.0 - 15.0 g/dL   HCT 09.8 11.9 - 14.7 %   MCV 103.4 (H) 80.0 - 100.0 fL   MCH 34.9 (H) 26.0 -  34.0 pg   MCHC 33.8 30.0 - 36.0 g/dL   RDW 82.9 56.2 - 13.0 %   Platelets 211 150 - 400 K/uL   nRBC 0.0 0.0 - 0.2 %   Neutrophils Relative % 67 %   Neutro Abs 3.8 1.7 - 7.7 K/uL   Lymphocytes Relative 22 %   Lymphs Abs 1.2 0.7 - 4.0 K/uL   Monocytes Relative 8 %   Monocytes Absolute 0.5 0.1 - 1.0 K/uL   Eosinophils Relative 2 %   Eosinophils Absolute 0.1 0.0 - 0.5 K/uL   Basophils Relative 1 %   Basophils Absolute 0.0 0.0 - 0.1 K/uL   Immature Granulocytes 0 %   Abs Immature Granulocytes 0.02 0.00 - 0.07 K/uL      Assessment & Plan:   Problem List Items Addressed This Visit       Cardiovascular and Mediastinum   Primary hypertension    Start metoprolol 25 mg bid,  continue hydrochlorothiazide 25 mg daily and amlodipine 5 mg daily.       Relevant Medications   metoprolol tartrate (LOPRESSOR) 25 MG tablet   Other Visit Diagnoses     Abnormal TSH    -  Primary   will recheck thyroid panel   Relevant Orders   Thyroid Panel With TSH   Palpitations       ekg, sinus rhythm, zio patch ordered, start metoprolol   Relevant Medications   metoprolol tartrate (LOPRESSOR) 25 MG tablet   Other Relevant Orders   Thyroid Panel With TSH   EKG 12-Lead   LONG TERM MONITOR (3-14 DAYS)        Follow up plan: Return in about 4 weeks (around 02/01/2023) for follow up Dr. Caralee Ates.

## 2023-01-04 NOTE — Progress Notes (Signed)
Patient stated that she went today to see her PCP and had additional labs done and also an EKG. I have called [facility] twice and left a message both times still waiting for a return call

## 2023-01-04 NOTE — Assessment & Plan Note (Signed)
Start metoprolol 25 mg bid,  continue hydrochlorothiazide 25 mg daily and amlodipine 5 mg daily.

## 2023-01-05 LAB — THYROID PANEL WITH TSH
Free Thyroxine Index: 2.5 (ref 1.4–3.8)
T3 Uptake: 36 % — ABNORMAL HIGH (ref 22–35)
T4, Total: 6.9 ug/dL (ref 5.1–11.9)
TSH: 0.34 mIU/L — ABNORMAL LOW

## 2023-01-06 ENCOUNTER — Other Ambulatory Visit: Payer: Self-pay | Admitting: Internal Medicine

## 2023-01-06 ENCOUNTER — Other Ambulatory Visit: Payer: Self-pay | Admitting: Psychiatry

## 2023-01-06 DIAGNOSIS — R7989 Other specified abnormal findings of blood chemistry: Secondary | ICD-10-CM

## 2023-01-26 ENCOUNTER — Other Ambulatory Visit: Payer: Self-pay | Admitting: Nurse Practitioner

## 2023-01-26 DIAGNOSIS — R002 Palpitations: Secondary | ICD-10-CM

## 2023-01-26 DIAGNOSIS — I10 Essential (primary) hypertension: Secondary | ICD-10-CM

## 2023-01-26 NOTE — Telephone Encounter (Signed)
Requested Prescriptions  Pending Prescriptions Disp Refills   metoprolol tartrate (LOPRESSOR) 25 MG tablet [Pharmacy Med Name: METOPROLOL TARTRATE 25 MG TAB] 180 tablet 0    Sig: TAKE 1 TABLET BY MOUTH TWICE A DAY     Cardiovascular:  Beta Blockers Failed - 01/26/2023 12:30 PM      Failed - Last BP in normal range    BP Readings from Last 1 Encounters:  01/04/23 (!) 144/90         Passed - Last Heart Rate in normal range    Pulse Readings from Last 1 Encounters:  01/04/23 100         Passed - Valid encounter within last 6 months    Recent Outpatient Visits           3 weeks ago Abnormal TSH   [facility] Berniece Salines, FNP   1 month ago Jaw swelling   [facility] Margarita Mail, DO   7 months ago Primary hypertension   [facility] Danelle Berry, PA-C   7 months ago Acute post-traumatic headache, not intractable   [facility] Della Goo F, FNP   8 months ago Moderate episode of recurrent major depressive disorder [facility])   [facility] Margarita Mail, DO       Future Appointments             In 1 week Margarita Mail, DO [facility], [facility]

## 2023-02-01 NOTE — Progress Notes (Unsigned)
Established Patient Office Visit  Subjective    Patient ID: Cindy Brewer, female    DOB: [birth year]  Age: 52 y.o. MRN: [id number]  CC:  Chief Complaint  Patient presents with   Follow-up    HPI Cindy Brewer presents to follow up.   Abnormal Thyroid Labs: -Last 01/04/23 with Tsh 0.34, T3 36, T4 6.9 -Has been having more diarrhea, palpitations and anxiety lately   Breast Cancer, multifocal triple negative Stage IIB: -Following with Oncology, Dr. Cathie Hoops. Note and labs reviewed from 12/08/22.  -S/p left lumpectomy and sentinel lymph node biopsy in 2019 -Patient opted out of chemotherapy  History of Iron Deficiency Anemia secondary to uterine fibroids: -Received iron transfusions in the past but since she had polypectomy and myomectomy her hemoglobin has been stable, however MCV elevated on last set of labs.  -Started on Vitamin B12 and folate supplements   Hypertension: -Medications: Amlodipine 5 mg, HCTZ 25 mg, added Metoprolol 25 mg BID at LOV -Patient has been compliant with Metoprolol but has not been taking the others -Failed Meds: Losartan makes her heart race  -Checking BP at home (average): not checking currently but does have a cuff -Denies any SOB, CP, vision changes, LE edema or symptoms of hypotension.   MDD: -Mood status: stable -Current treatment: Lexapro 20 mg, Naltrexone new -Follows with Psychiatry, last seen 12/15/22     02/02/2023    1:11 PM 01/04/2023    1:01 PM 12/15/2022   12:32 PM 12/06/2022    2:37 PM 08/02/2022   10:13 AM  Depression screen PHQ 2/9  Decreased Interest 2 2  0   Down, Depressed, Hopeless 2 3  0   PHQ - 2 Score 4 5  0   Altered sleeping 3 3  0   Tired, decreased energy 3 3  0   Change in appetite 3 0  0   Feeling bad or failure about yourself  1 1  0   Trouble concentrating 2 1  0   Moving slowly or fidgety/restless 0 0  0   Suicidal thoughts 1 3  0   PHQ-9 Score 17 16  0   Difficult doing work/chores Somewhat difficult  Extremely dIfficult  Not difficult at all      Information is confidential and restricted. Go to Review Flowsheets to unlock data.   Health Maintenance: -Blood work UTD -Mammogram scheduled for November 29 -Colon cancer screening due   Outpatient Encounter Medications as of 02/02/2023  Medication Sig   amLODipine (NORVASC) 5 MG tablet Take 1 tablet (5 mg total) by mouth daily.   ASHWAGANDHA PO Take by mouth.   Black Cohosh 160 MG CAPS Take by mouth.   cyanocobalamin (VITAMIN B12) 1000 MCG tablet Take 1 tablet (1,000 mcg total) by mouth daily.   escitalopram (LEXAPRO) 20 MG tablet Take 1 tablet (20 mg total) by mouth daily.   escitalopram (LEXAPRO) 5 MG tablet TAKE 1 TABLET (5 MG TOTAL) BY MOUTH DAILY.   folic acid (FOLVITE) 1 MG tablet Take 1 tablet (1 mg total) by mouth daily.   hydrochlorothiazide (HYDRODIURIL) 25 MG tablet Take 1 tablet (25 mg total) by mouth daily.   metoprolol tartrate (LOPRESSOR) 25 MG tablet TAKE 1 TABLET BY MOUTH TWICE A DAY   Multiple Vitamin (MULTIVITAMIN WITH MINERALS) TABS tablet Take 1 tablet by mouth daily.   naltrexone (DEPADE) 50 MG tablet Take 0.5 tablets (25 mg total) by mouth daily.   omeprazole (PRILOSEC) 20 MG capsule Take 1 capsule (  20 mg total) by mouth daily.   pyridOXINE (VITAMIN B-6) 100 MG tablet Take 100 mg by mouth daily.   Thiamine HCl (VITAMIN B-1) 250 MG tablet Take 250 mg by mouth daily.   tiZANidine (ZANAFLEX) 4 MG tablet Take 1 tablet (4 mg total) by mouth every 6 (six) hours as needed for muscle spasms.   Vitamin D, Ergocalciferol, (DRISDOL) 1.25 MG (50000 UNIT) CAPS capsule TAKE 1 CAPSULE (50,000 UNITS TOTAL) BY MOUTH EVERY 7 (SEVEN) DAYS   Facility-Administered Encounter Medications as of 02/02/2023  Medication   0.9 %  sodium chloride infusion    Past Medical History:  Diagnosis Date   Anemia    Anxiety    Arthritis    right knee    Cancer (HCC) 2019   BREAST- Left   Depression    Fibroid    GERD (gastroesophageal reflux  disease)     Past Surgical History:  Procedure Laterality Date   BREAST BIOPSY Left 2019   IDC   BREAST LUMPECTOMY WITH AXILLARY LYMPH NODE BIOPSY Left 11/24/2017   Procedure: LEFT BREAST LUMPECTOMY WITH LEFT SENTINEL NODE LYMPH NODE BIOPSY;  Surgeon: Griselda Miner, MD;  Location: [facility];  Service: General;  Laterality: Left;   CESAREAN SECTION     EYE SURGERY     DETACHED RETINA 4/519   HYSTEROSCOPY WITH D & C N/A 03/14/2019   Procedure: DILATATION AND CURETTAGE Matt Holmes;  Surgeon: Conard Novak, MD;  Location: [facility];  Service: Gynecology;  Laterality: N/A;   IR EMBO TUMOR ORGAN ISCHEMIA INFARCT INC GUIDE ROADMAPPING      Family History  Problem Relation Age of Onset   Kidney disease Mother    Heart disease Mother    Stroke Mother    Hepatitis C Mother    Dementia Father    Blindness Father    Kidney disease Sister    Schizophrenia Brother    Hypertension Brother    Diabetes Brother    Schizophrenia Paternal Aunt    Suicidality Paternal Aunt    Stroke Maternal Grandmother    Breast cancer Neg Hx     Social History   Socioeconomic History   Marital status: Divorced    Spouse name: Not on file   Number of children: 5   Years of education: Not on file   Highest education level: Some college, no degree  Occupational History   Not on file  Tobacco Use   Smoking status: Every Day    Current packs/day: 0.00    Average packs/day: 0.3 packs/day for 13.0 years (3.3 ttl pk-yrs)    Types: Cigars, Cigarettes    Start date: 05/04/2009    Last attempt to quit: 05/04/2022    Years since quitting: 0.7   Smokeless tobacco: Never  Vaping Use   Vaping status: Never Used  Substance and Sexual Activity   Alcohol use: Yes    Comment: weekends   Drug use: No   Sexual activity: Not Currently    Birth control/protection: None  Other Topics Concern   Not on file  Social History Narrative   ** Merged History Encounter **       Social Determinants of  Health   Financial Resource Strain: Patient Declined (12/06/2022)   Overall Financial Resource Strain (CARDIA)    Difficulty of Paying Living Expenses: Patient declined  Food Insecurity: No Food Insecurity (12/06/2022)   Hunger Vital Sign    Worried About Running Out of Food in the Last Year: Never  true    Ran Out of Food in the Last Year: Never true  Transportation Needs: No Transportation Needs (12/06/2022)   PRAPARE - Administrator, Civil Service (Medical): No    Lack of Transportation (Non-Medical): No  Physical Activity: Insufficiently Active (12/06/2022)   Exercise Vital Sign    Days of Exercise per Week: 2 days    Minutes of Exercise per Session: 30 min  Stress: No Stress Concern Present (12/06/2022)   [employer] of Occupational Health - Occupational Stress Questionnaire    Feeling of Stress : Only a little  Social Connections: Unknown (12/06/2022)   Social Connection and Isolation Panel [NHANES]    Frequency of Communication with Friends and Family: Once a week    Frequency of Social Gatherings with Friends and Family: Never    Attends Religious Services: Patient declined    Database administrator or Organizations: No    Attends Engineer, structural: Not on file    Marital Status: Divorced  Intimate Partner Violence: Not At Risk (06/02/2022)   Humiliation, Afraid, Rape, and Kick questionnaire    Fear of Current or Ex-Partner: No    Emotionally Abused: No    Physically Abused: No    Sexually Abused: No    Review of Systems  Constitutional:  Negative for chills and fever.  Eyes:  Negative for blurred vision.  Respiratory:  Negative for shortness of breath.   Cardiovascular:  Positive for palpitations. Negative for chest pain.  Gastrointestinal:  Positive for diarrhea.  Psychiatric/Behavioral:  The patient is nervous/anxious.       Objective    BP (!) 166/98   Pulse 89   Temp 98 F (36.7 C)   Resp 18   Ht 5\' 6"  (1.676 m)   Wt 160 lb 3.2 oz  (72.7 kg)   LMP 03/27/2020   SpO2 96%   BMI 25.86 kg/m   Physical Exam Constitutional:      Appearance: Normal appearance.  HENT:     Head: Normocephalic and atraumatic.  Eyes:     Conjunctiva/sclera: Conjunctivae normal.  Neck:     Comments: Thyroid enlarged, more so on the left  Cardiovascular:     Rate and Rhythm: Normal rate and regular rhythm.  Pulmonary:     Effort: Pulmonary effort is normal.     Breath sounds: Normal breath sounds.  Skin:    General: Skin is warm and dry.  Neurological:     General: No focal deficit present.     Mental Status: She is alert. Mental status is at baseline.  Psychiatric:        Mood and Affect: Mood normal.        Behavior: Behavior normal.       Assessment & Plan:   1. Primary hypertension: Elevated but not on regular medications. Discussed in depth, she will restart Amlodipine 5 mg, hydrochlorothiazide 25 mg and continue Metoprolol 25 mg BID. Recheck in 1 month.  - amLODipine (NORVASC) 5 MG tablet; Take 1 tablet (5 mg total) by mouth daily.  Dispense: 90 tablet; Refill: 1 - hydrochlorothiazide (HYDRODIURIL) 25 MG tablet; Take 1 tablet (25 mg total) by mouth daily.  Dispense: 90 tablet; Refill: 1  2. Decreased thyroid stimulating hormone (TSH) level: Repeat labs, check for Grave's. Thyroid enlarged asymmetrically on exam, will obtain thyroid US.   - Thyroid Panel With TSH - TRAb (TSH Receptor Binding Antibody) - US THYROID; Future  3. Vitamin D deficiency: Had been on  supplements in the past but not currently, recheck levels.  - Vitamin D (25 hydroxy)  4. Gastroesophageal reflux disease without esophagitis: Stable, taking Prilosec PRN.   - omeprazole (PRILOSEC) 20 MG capsule; Take 1 capsule (20 mg total) by mouth daily as needed (acid reflux).   Return in 4 weeks (on 03/02/2023).   Margarita Mail, DO   HPI

## 2023-02-02 ENCOUNTER — Encounter: Payer: Self-pay | Admitting: Internal Medicine

## 2023-02-02 ENCOUNTER — Ambulatory Visit (INDEPENDENT_AMBULATORY_CARE_PROVIDER_SITE_OTHER): Payer: Medicare HMO | Admitting: Internal Medicine

## 2023-02-02 VITALS — BP 168/94 | HR 89 | Temp 98.0°F | Resp 18 | Ht 66.0 in | Wt 160.2 lb

## 2023-02-02 DIAGNOSIS — E559 Vitamin D deficiency, unspecified: Secondary | ICD-10-CM

## 2023-02-02 DIAGNOSIS — R7989 Other specified abnormal findings of blood chemistry: Secondary | ICD-10-CM

## 2023-02-02 DIAGNOSIS — K219 Gastro-esophageal reflux disease without esophagitis: Secondary | ICD-10-CM

## 2023-02-02 DIAGNOSIS — I10 Essential (primary) hypertension: Secondary | ICD-10-CM

## 2023-02-02 MED ORDER — AMLODIPINE BESYLATE 5 MG PO TABS
5.0000 mg | ORAL_TABLET | Freq: Every day | ORAL | 1 refills | Status: DC
Start: 2023-02-02 — End: 2023-09-26

## 2023-02-02 MED ORDER — HYDROCHLOROTHIAZIDE 25 MG PO TABS
25.0000 mg | ORAL_TABLET | Freq: Every day | ORAL | 1 refills | Status: DC
Start: 2023-02-02 — End: 2023-09-26

## 2023-02-02 NOTE — Patient Instructions (Addendum)
It was great seeing you today!  Plan discussed at today's visit: -Blood work ordered today, results will be uploaded to MyChart.  -For blood pressure: take hydrochlorothiazide 25 mg and Metoprolol 25 mg in the morning, take Amlodipine 5 mg and second dose of Metoprolol in th evening  -Continue folate and Vitamin B12 supplements Thyroid US ordered please call to set up at [phone] -Medication list updated    Follow up in: 1 month   Take care and let us know if you have any questions or concerns prior to your next visit.  Dr. Caralee Ates

## 2023-02-07 ENCOUNTER — Ambulatory Visit: Payer: Medicare HMO

## 2023-02-09 ENCOUNTER — Ambulatory Visit (INDEPENDENT_AMBULATORY_CARE_PROVIDER_SITE_OTHER): Payer: Medicare HMO

## 2023-02-09 VITALS — Ht 66.0 in | Wt 160.0 lb

## 2023-02-09 DIAGNOSIS — Z0000 Encounter for general adult medical examination without abnormal findings: Secondary | ICD-10-CM
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
## 2023-02-09 NOTE — Patient Instructions (Signed)
Cindy Brewer , Thank you for taking time to come for your Medicare Wellness Visit. I appreciate your ongoing commitment to your health goals. Please review the following plan we discussed and let me know if I can assist you in the future.   Referrals/Orders/Follow-Ups/Clinician Recommendations: none  This is a list of the screening recommended for you and due dates:  Health Maintenance  Topic Date Due   Colon Cancer Screening  Never done   Pap Smear  01/02/2023   Flu Shot  02/02/2023   COVID-19 Vaccine (1) 02/18/2023*   Zoster (Shingles) Vaccine (1 of 2) 03/08/2023*   Medicare Annual Wellness Visit  02/09/2024   Mammogram  06/01/2024   Hepatitis C Screening  Completed   HIV Screening  Completed   HPV Vaccine  Aged Out   DTaP/Tdap/Td vaccine  Discontinued  *Topic was postponed. The date shown is not the original due date.    Advanced directives: (Declined) Advance directive discussed with you today. Even though you declined this today, please call our office should you change your mind, and we can give you the proper paperwork for you to fill out.  Next Medicare Annual Wellness Visit scheduled for next year: Yes 02/15/23 @9 :15am telephone  Preventive Care 40-64 Years, Female Preventive care refers to lifestyle choices and visits with your health care provider that can promote health and wellness. What does preventive care include? A yearly physical exam. This is also called an annual well check. Dental exams once or twice a year. Routine eye exams. Ask your health care provider how often you should have your eyes checked. Personal lifestyle choices, including: Daily care of your teeth and gums. Regular physical activity. Eating a healthy diet. Avoiding tobacco and drug use. Limiting alcohol use. Practicing safe sex. Taking low-dose aspirin daily starting at age 73. Taking vitamin and mineral supplements as recommended by your health care provider. What happens during an  annual well check? The services and screenings done by your health care provider during your annual well check will depend on your age, overall health, lifestyle risk factors, and family history of disease. Counseling  Your health care provider may ask you questions about your: Alcohol use. Tobacco use. Drug use. Emotional well-being. Home and relationship well-being. Sexual activity. Eating habits. Work and work Astronomer. Method of birth control. Menstrual cycle. Pregnancy history. Screening  You may have the following tests or measurements: Height, weight, and BMI. Blood pressure. Lipid and cholesterol levels. These may be checked every 5 years, or more frequently if you are over 86 years old. Skin check. Lung cancer screening. You may have this screening every year starting at age 28 if you have a 30-pack-year history of smoking and currently smoke or have quit within the past 15 years. Fecal occult blood test (FOBT) of the stool. You may have this test every year starting at age 68. Flexible sigmoidoscopy or colonoscopy. You may have a sigmoidoscopy every 5 years or a colonoscopy every 10 years starting at age 75. Hepatitis C blood test. Hepatitis B blood test. Sexually transmitted disease (STD) testing. Diabetes screening. This is done by checking your blood sugar (glucose) after you have not eaten for a while (fasting). You may have this done every 1-3 years. Mammogram. This may be done every 1-2 years. Talk to your health care provider about when you should start having regular mammograms. This may depend on whether you have a family history of breast cancer. BRCA-related cancer screening. This may be done if you  have a family history of breast, ovarian, tubal, or peritoneal cancers. Pelvic exam and Pap test. This may be done every 3 years starting at age 48. Starting at age 53, this may be done every 5 years if you have a Pap test in combination with an HPV test. Bone  density scan. This is done to screen for osteoporosis. You may have this scan if you are at high risk for osteoporosis. Discuss your test results, treatment options, and if necessary, the need for more tests with your health care provider. Vaccines  Your health care provider may recommend certain vaccines, such as: Influenza vaccine. This is recommended every year. Tetanus, diphtheria, and acellular pertussis (Tdap, Td) vaccine. You may need a Td booster every 10 years. Zoster vaccine. You may need this after age 76. Pneumococcal 13-valent conjugate (PCV13) vaccine. You may need this if you have certain conditions and were not previously vaccinated. Pneumococcal polysaccharide (PPSV23) vaccine. You may need one or two doses if you smoke cigarettes or if you have certain conditions. Talk to your health care provider about which screenings and vaccines you need and how often you need them. This information is not intended to replace advice given to you by your health care provider. Make sure you discuss any questions you have with your health care provider. Document Released: 07/17/2015 Document Revised: 03/09/2016 Document Reviewed: 04/21/2015 Elsevier Interactive Patient Education  2017 [employer].    Fall Prevention in the Home Falls can cause injuries. They can happen to people of all ages. There are many things you can do to make your home safe and to help prevent falls. What can I do on the outside of my home? Regularly fix the edges of walkways and driveways and fix any cracks. Remove anything that might make you trip as you walk through a door, such as a raised step or threshold. Trim any bushes or trees on the path to your home. Use bright outdoor lighting. Clear any walking paths of anything that might make someone trip, such as rocks or tools. Regularly check to see if handrails are loose or broken. Make sure that both sides of any steps have handrails. Any raised decks and  porches should have guardrails on the edges. Have any leaves, snow, or ice cleared regularly. Use sand or salt on walking paths during winter. Clean up any spills in your garage right away. This includes oil or grease spills. What can I do in the bathroom? Use night lights. Install grab bars by the toilet and in the tub and shower. Do not use towel bars as grab bars. Use non-skid mats or decals in the tub or shower. If you need to sit down in the shower, use a plastic, non-slip stool. Keep the floor dry. Clean up any water that spills on the floor as soon as it happens. Remove soap buildup in the tub or shower regularly. Attach bath mats securely with double-sided non-slip rug tape. Do not have throw rugs and other things on the floor that can make you trip. What can I do in the bedroom? Use night lights. Make sure that you have a light by your bed that is easy to reach. Do not use any sheets or blankets that are too big for your bed. They should not hang down onto the floor. Have a firm chair that has side arms. You can use this for support while you get dressed. Do not have throw rugs and other things on the floor that  can make you trip. What can I do in the kitchen? Clean up any spills right away. Avoid walking on wet floors. Keep items that you use a lot in easy-to-reach places. If you need to reach something above you, use a strong step stool that has a grab bar. Keep electrical cords out of the way. Do not use floor polish or wax that makes floors slippery. If you must use wax, use non-skid floor wax. Do not have throw rugs and other things on the floor that can make you trip. What can I do with my stairs? Do not leave any items on the stairs. Make sure that there are handrails on both sides of the stairs and use them. Fix handrails that are broken or loose. Make sure that handrails are as long as the stairways. Check any carpeting to make sure that it is firmly attached to the  stairs. Fix any carpet that is loose or worn. Avoid having throw rugs at the top or bottom of the stairs. If you do have throw rugs, attach them to the floor with carpet tape. Make sure that you have a light switch at the top of the stairs and the bottom of the stairs. If you do not have them, ask someone to add them for you. What else can I do to help prevent falls? Wear shoes that: Do not have high heels. Have rubber bottoms. Are comfortable and fit you well. Are closed at the toe. Do not wear sandals. If you use a stepladder: Make sure that it is fully opened. Do not climb a closed stepladder. Make sure that both sides of the stepladder are locked into place. Ask someone to hold it for you, if possible. Clearly mark and make sure that you can see: Any grab bars or handrails. First and last steps. Where the edge of each step is. Use tools that help you move around (mobility aids) if they are needed. These include: Canes. Walkers. Scooters. Crutches. Turn on the lights when you go into a dark area. Replace any light bulbs as soon as they burn out. Set up your furniture so you have a clear path. Avoid moving your furniture around. If any of your floors are uneven, fix them. If there are any pets around you, be aware of where they are. Review your medicines with your doctor. Some medicines can make you feel dizzy. This can increase your chance of falling. Ask your doctor what other things that you can do to help prevent falls. This information is not intended to replace advice given to you by your health care provider. Make sure you discuss any questions you have with your health care provider. Document Released: 04/16/2009 Document Revised: 11/26/2015 Document Reviewed: 07/25/2014 Elsevier Interactive Patient Education  2017 [employer].

## 2023-02-11 NOTE — Progress Notes (Unsigned)
No show

## 2023-02-14 ENCOUNTER — Ambulatory Visit: Payer: Medicare HMO | Admitting: Psychiatry

## 2023-02-14 DIAGNOSIS — F331 Major depressive disorder, recurrent, moderate: Secondary | ICD-10-CM

## 2023-04-17 ENCOUNTER — Encounter: Payer: Self-pay | Admitting: Physician Assistant

## 2023-04-17 ENCOUNTER — Ambulatory Visit
Admission: RE | Admit: 2023-04-17 | Discharge: 2023-04-17 | Disposition: A | Discharge status: Home / Self Care | Payer: Medicare HMO | Source: Ambulatory Visit | Attending: Physician Assistant | Admitting: Physician Assistant

## 2023-04-17 ENCOUNTER — Ambulatory Visit: Payer: Medicare HMO | Admitting: Physician Assistant

## 2023-04-17 VITALS — BP 158/104 | HR 92 | Temp 97.7°F | Resp 16 | Ht 66.0 in | Wt 155.6 lb

## 2023-04-17 DIAGNOSIS — W11XXXA Fall on and from ladder, initial encounter: Secondary | ICD-10-CM | POA: Diagnosis not present

## 2023-04-17 DIAGNOSIS — M778 Other enthesopathies, not elsewhere classified: Secondary | ICD-10-CM | POA: Diagnosis not present

## 2023-04-17 DIAGNOSIS — R42 Dizziness and giddiness: Secondary | ICD-10-CM

## 2023-04-17 DIAGNOSIS — W19XXXA Unspecified fall, initial encounter: Secondary | ICD-10-CM

## 2023-04-17 DIAGNOSIS — M25562 Pain in left knee: Secondary | ICD-10-CM

## 2023-04-17 DIAGNOSIS — G44319 Acute post-traumatic headache, not intractable: Secondary | ICD-10-CM

## 2023-04-17 NOTE — Progress Notes (Signed)
Acute Office Visit   Patient: Cindy Brewer   DOB: [birth year]   51 y.o. Female  MRN: [id number] Visit Date: 04/17/2023  Today's healthcare provider: Oswaldo Conroy Shamar Engelmann, PA-C  Introduced myself to the patient as a Secondary school teacher and provided education on APPs in clinical practice.    Chief Complaint  Patient presents with   Fall    Pt felled off from a ladder on Thursday of last week and would like to have some imaging. Pt sore from body   Subjective    HPI HPI     Fall    Additional comments: Pt felled off from a ladder on Thursday of last week and would like to have some imaging. Pt sore from body      Last edited by Forde Radon, CMA on 04/17/2023  9:05 AM.         Fall from ladder (she was at least 6 feet up when she fell)   Occurrence date: 04/13/23- thurs  Head involvement: she reports she did hit her head  She states she felt whoozy and had blurry vision the day after along with pain on the left side of her head  LOC: she denies LOC  Neck pain: none Dizziness: yes- when she is walking she feels like she is leaning to the left  Confusion: none   Location: left knee pain and head ache  Radiation: none  Pain level and character: dull ache  Other associated symptoms: Interventions: Tylenol  Alleviating: tylenol and rest  Aggravating: nothing   She reports her head and knee pain have been improving with rest   Medications: Outpatient Medications Prior to Visit  Medication Sig   amLODipine (NORVASC) 5 MG tablet Take 1 tablet (5 mg total) by mouth daily.   cyanocobalamin (VITAMIN B12) 1000 MCG tablet Take 1 tablet (1,000 mcg total) by mouth daily.   escitalopram (LEXAPRO) 20 MG tablet Take 1 tablet (20 mg total) by mouth daily.   folic acid (FOLVITE) 1 MG tablet Take 1 tablet (1 mg total) by mouth daily.   hydrochlorothiazide (HYDRODIURIL) 25 MG tablet Take 1 tablet (25 mg total) by mouth daily.   metoprolol tartrate (LOPRESSOR) 25 MG tablet  TAKE 1 TABLET BY MOUTH TWICE A DAY   Multiple Vitamin (MULTIVITAMIN WITH MINERALS) TABS tablet Take 1 tablet by mouth daily.   omeprazole (PRILOSEC) 20 MG capsule Take 1 capsule (20 mg total) by mouth daily as needed (acid reflux).   tiZANidine (ZANAFLEX) 4 MG tablet Take 1 tablet (4 mg total) by mouth every 6 (six) hours as needed for muscle spasms.   Facility-Administered Medications Prior to Visit  Medication Dose Route Frequency Provider   0.9 %  sodium chloride infusion   Intravenous Continuous Rickard Patience, MD    Review of Systems  Constitutional:  Positive for appetite change.  Eyes:  Positive for visual disturbance.  Gastrointestinal:  Negative for nausea and vomiting.  Neurological:  Positive for light-headedness.  Psychiatric/Behavioral:  Negative for confusion.         Objective    BP (!) 158/104   Pulse 92   Temp 97.7 F (36.5 C) (Oral)   Resp 16   Ht 5\' 6"  (1.676 m)   Wt 155 lb 9.6 oz (70.6 kg)   LMP 03/27/2020   SpO2 98%   BMI 25.11 kg/m     Physical Exam Vitals reviewed.  Constitutional:      General: She is awake.  Appearance: Normal appearance. She is well-developed and well-groomed.  HENT:     Head: Normocephalic and atraumatic.  Eyes:     General: Lids are normal. Gaze aligned appropriately.     Extraocular Movements: Extraocular movements intact.     Conjunctiva/sclera: Conjunctivae normal.     Pupils: Pupils are equal, round, and reactive to light.  Pulmonary:     Effort: Pulmonary effort is normal.  Musculoskeletal:     Cervical back: Normal range of motion.  Neurological:     General: No focal deficit present.     Mental Status: She is alert and oriented to person, place, and time.     GCS: GCS eye subscore is 4. GCS verbal subscore is 5. GCS motor subscore is 6.     Cranial Nerves: No cranial nerve deficit, dysarthria or facial asymmetry.     Motor: No weakness, tremor, atrophy or abnormal muscle tone.     Gait: Gait is intact.      Comments: Comments: MENTAL STATUS: AAOx3, memory intact, fund of knowledge appropriate   LANG/SPEECH: Naming and repetition intact, fluent, no dysarthria, follows 3-step commands, answers questions appropriately   CRANIAL NERVES:   II: Pupils equal and reactive, no RAPD   III, IV, VI: EOM intact, no gaze preference or deviation, no nystagmus.   V: normal sensation in V1, V2, and V3 segments bilaterally   VII: no asymmetry, no nasolabial fold flattening   VIII: normal hearing to speech   IX, X: normal palatal elevation, no uvular deviation   XI: 5/5 head turn and 5/5 shoulder shrug bilaterally   XII: midline tongue protrusion   MOTOR:  5/5 bilateral grip strength 5/5 hip flexion   COORD: Normal finger to nose no tremor, no dysmetria   STATION: normal stance, no truncal ataxia   GAIT: Normal   Psychiatric:        Attention and Perception: Attention and perception normal.        Mood and Affect: Mood and affect normal.        Speech: Speech normal.        Behavior: Behavior normal. Behavior is cooperative.       No results found for any visits on 04/17/23.  Assessment & Plan      Return in about 6 weeks (around 05/29/2023) for Annual physical.      Problem List Items Addressed This Visit   None Visit Diagnoses     Acute post-traumatic headache, not intractable    -  Primary Acute, new concern Patient reports falling from a ladder while she was at least 6 feet in the air and hitting her head during tumble She denies loss of consciousness, nausea, vomiting, confusion Neuro exam was overall normal and reassuring She does report persistent dizziness since the fall. Will check CT head without contrast for rule out intracranial issues Suspect likely concussion at this time given symptoms Results of imaging to dictate further management For now recommend concussion protocol with brain rest    Relevant Orders   CT HEAD WO CONTRAST ( )   Fall, initial encounter        Relevant Orders   DG Knee Complete 4 Views Left   CT HEAD WO CONTRAST ( )   Dizziness on standing       Relevant Orders   CT HEAD WO CONTRAST ( )   Acute pain of left knee     Acute, new concern Patient reports left-sided knee pain after fall Physical exam is overall reassuring  with intact range of motion and no swelling or obvious deformity  on palpation Will get left knee x-ray for evaluation, results to dictate further management For now recommend conservative management with Tylenol as needed for pain Follow-up as needed for progressing or persistent symptoms   Relevant Orders   DG Knee Complete 4 Views Left        Return in about 6 weeks (around 05/29/2023) for Annual physical.   I, Duc Crocket E Benjy Kana, PA-C, have reviewed all documentation for this visit. The documentation on 04/17/23 for the exam, diagnosis, procedures, and orders are all accurate and complete.   Jacquelin Hawking, MHS, PA-C [facility]

## 2023-04-19 ENCOUNTER — Ambulatory Visit
Admission: RE | Admit: 2023-04-19 | Discharge: 2023-04-19 | Disposition: A | Discharge status: Home / Self Care | Payer: Medicare HMO | Source: Ambulatory Visit | Attending: Physician Assistant | Admitting: Physician Assistant

## 2023-04-19 ENCOUNTER — Encounter: Payer: Self-pay | Admitting: Oncology

## 2023-04-19 DIAGNOSIS — W19XXXA Unspecified fall, initial encounter: Secondary | ICD-10-CM | POA: Insufficient documentation

## 2023-04-19 DIAGNOSIS — R519 Headache, unspecified: Secondary | ICD-10-CM | POA: Diagnosis not present

## 2023-04-19 DIAGNOSIS — G44319 Acute post-traumatic headache, not intractable: Secondary | ICD-10-CM | POA: Diagnosis not present

## 2023-04-19 DIAGNOSIS — J328 Other chronic sinusitis: Secondary | ICD-10-CM | POA: Insufficient documentation

## 2023-04-19 DIAGNOSIS — R42 Dizziness and giddiness: Secondary | ICD-10-CM | POA: Diagnosis not present

## 2023-04-20 NOTE — Progress Notes (Signed)
Acute Office Visit  Subjective:     Patient ID: Cindy Brewer, female    DOB: Mar 18, 1971, 52 y.o.   MRN: [id number]  Chief Complaint  Patient presents with   Abdominal Pain    Epigastric cramping followed by bowel movement with nausea    HPI Patient is in today for abdominal pain. Symptoms started 4 days ago.  Abdominal Complaint: -Duration:  4 days -Frequency: intermittent - had been every few hours at first but now improving in severity and frequency  -Nature: bloating, aching, and cramping -Location: epigastric  -Radiation: no -Alleviating factors: Nothing -Aggravating factors: Ate at [employer] day prior to symptoms starting - ate fried chicken, collard greens, green beans and sweet potatoes  -Treatments attempted: none -Constipation: no -Diarrhea: yes -Episodes of diarrhea/day: 5-7 but improving -Mucous in the stool: yes -Heartburn: no -Bloating:yes -Nausea: yes -Vomiting: no -Melena or hematochezia: no -Also having some dysuria   Of note, patient was seen here last week after a fall. CT ordered which was negative for acute changes but showed some chronic sinus disease and partially empty sella turicia that could be reflective of chronic idiopathetic intracranial hypertension. She does have chronic headache, blurred vision.   Review of Systems  Constitutional:  Negative for chills and fever.  HENT:  Negative for tinnitus.   Eyes:  Positive for blurred vision. Negative for double vision and pain.  Respiratory:  Negative for shortness of breath.   Cardiovascular:  Negative for chest pain.  Gastrointestinal:  Positive for abdominal pain, blood in stool, diarrhea, melena and nausea. Negative for constipation, heartburn and vomiting.  Genitourinary:  Positive for dysuria. Negative for flank pain, frequency, hematuria and urgency.  Neurological:  Positive for headaches.        Objective:    BP 134/84   Pulse 95   Temp 97.8 F (36.6 C)   Resp 18    Ht 5\' 6"  (1.676 m)   Wt 156 lb 3.2 oz (70.9 kg)   LMP 03/27/2020   SpO2 98%   BMI 25.21 kg/m  BP Readings from Last 3 Encounters:  04/21/23 134/84  04/17/23 (!) 158/104  02/02/23 (!) 168/94   Wt Readings from Last 3 Encounters:  04/21/23 156 lb 3.2 oz (70.9 kg)  04/17/23 155 lb 9.6 oz (70.6 kg)  02/09/23 160 lb (72.6 kg)      Physical Exam Constitutional:      Appearance: Normal appearance. She is well-developed.  HENT:     Head: Normocephalic and atraumatic.  Eyes:     Conjunctiva/sclera: Conjunctivae normal.  Cardiovascular:     Rate and Rhythm: Normal rate and regular rhythm.  Pulmonary:     Effort: Pulmonary effort is normal.     Breath sounds: Normal breath sounds.  Abdominal:     General: Bowel sounds are normal. There is no distension.     Palpations: Abdomen is soft. There is no mass.     Tenderness: There is abdominal tenderness. There is no guarding or rebound.     Hernia: No hernia is present.  Skin:    General: Skin is warm and dry.  Neurological:     General: No focal deficit present.     Mental Status: She is alert. Mental status is at baseline.  Psychiatric:        Mood and Affect: Mood normal.        Behavior: Behavior normal.     No results found for any visits on 04/21/23.  Assessment & Plan:   1. Diarrhea of presumed infectious origin: Symptoms consistent with GI illness and infectious diarrhea. Symptoms improving. Will test stool for pathogens.   - Ova and parasite examination - Salmonella/Shigella Cult, Campy EIA and Shiga Toxin reflex - C. difficile GDH and Toxin A/B  2. Dysuria: UA normal other than protein, will send for culture.   - POCT Urinalysis Dipstick - Urine Culture  3. Abnormal CT scan of head/Blurred vision, bilateral/Nonintractable episodic headache, unspecified headache type: Reviewed recent CT scan with the patient, she does have symptoms consistent with ICH, will refer to Neurology for further evaluation.   -  Ambulatory referral to Neurology   Return in about 3 months (around 07/22/2023).  Margarita Mail, DO

## 2023-04-21 ENCOUNTER — Ambulatory Visit: Payer: Medicare HMO | Admitting: Internal Medicine

## 2023-04-21 VITALS — BP 134/84 | HR 95 | Temp 97.8°F | Resp 18 | Ht 66.0 in | Wt 156.2 lb

## 2023-04-21 DIAGNOSIS — R519 Headache, unspecified: Secondary | ICD-10-CM

## 2023-04-21 DIAGNOSIS — H538 Other visual disturbances: Secondary | ICD-10-CM | POA: Diagnosis not present

## 2023-04-21 DIAGNOSIS — R300 Dysuria: Secondary | ICD-10-CM | POA: Diagnosis not present

## 2023-04-21 DIAGNOSIS — R930 Abnormal findings on diagnostic imaging of skull and head, not elsewhere classified: Secondary | ICD-10-CM | POA: Diagnosis not present

## 2023-04-21 DIAGNOSIS — R197 Diarrhea, unspecified: Secondary | ICD-10-CM

## 2023-04-21 LAB — POCT URINALYSIS DIPSTICK
Bilirubin, UA: NEGATIVE
Blood, UA: POSITIVE
Glucose, UA: NEGATIVE
Ketones, UA: POSITIVE
Leukocytes, UA: NEGATIVE
Nitrite, UA: NEGATIVE
Odor: NORMAL
Protein, UA: POSITIVE — AB
Spec Grav, UA: 1.02 (ref 1.010–1.025)
Urobilinogen, UA: 0.2 U/dL
pH, UA: 6.5 (ref 5.0–8.0)

## 2023-04-21 NOTE — Progress Notes (Signed)
Reviewed with PCP during apt on 04/21/23

## 2023-04-21 NOTE — Progress Notes (Signed)
Your knee xray is back There does not appear to be any fracture or dislocation present. There was a small bone spur along the upper edge of your knee cap but this should not require intervention. I suspect you likely have some mild muscle strain and bruising from the fall which is likely causing your pain at this time. Please continue with the management plan we discussed at your apt.

## 2023-04-22 LAB — URINE CULTURE
MICRO NUMBER:: 15615456
Result:: NO GROWTH
SPECIMEN QUALITY:: ADEQUATE

## 2023-04-24 ENCOUNTER — Encounter: Payer: Self-pay | Admitting: Oncology

## 2023-04-25 ENCOUNTER — Encounter: Payer: Self-pay | Admitting: Neurology

## 2023-04-25 ENCOUNTER — Ambulatory Visit: Payer: Medicare HMO | Admitting: Neurology

## 2023-04-25 VITALS — BP 145/96 | HR 85 | Ht 66.0 in | Wt 153.0 lb

## 2023-04-25 DIAGNOSIS — R030 Elevated blood-pressure reading, without diagnosis of hypertension: Secondary | ICD-10-CM

## 2023-04-25 DIAGNOSIS — E236 Other disorders of pituitary gland: Secondary | ICD-10-CM

## 2023-04-25 DIAGNOSIS — R519 Headache, unspecified: Secondary | ICD-10-CM | POA: Diagnosis not present

## 2023-04-25 DIAGNOSIS — Z789 Other specified health status: Secondary | ICD-10-CM | POA: Diagnosis not present

## 2023-04-25 DIAGNOSIS — H538 Other visual disturbances: Secondary | ICD-10-CM

## 2023-04-25 DIAGNOSIS — G4489 Other headache syndrome: Secondary | ICD-10-CM

## 2023-04-25 DIAGNOSIS — G4733 Obstructive sleep apnea (adult) (pediatric): Secondary | ICD-10-CM

## 2023-04-25 NOTE — Patient Instructions (Addendum)
It was nice to meet you today.  For your recurrent headaches, I would like to suggest additional workup and follow up with some of your current providers.   Your recent head CT showed a partially empty sella which can be associated with a finding called idiopathic intracranial hypertension (IIH), but can also be an incidental finding as discussed.  If you have condition called pseudotumor cerebri aka IIH, which means that there increased fluid pressure around your brain, it can result in pressure on your brain, which can cause headaches, and pressure on the eye nerve(s), which can cause blurry vision, even loss of vision.  I would like to suggest a few things today:   1. We will do another scan called a CT venogram of the head.  It looks at the veins and drainage system of the brain. 2.  Please get a formal eye examination, you can make an appointment with Dr. Clydene Pugh and ask for a full, dilated eye exam, including visual field testing, to see if you have had any loss of peripheral vision and to see if you have papilledema (swelling of the eye nerves - one or both).  3. We may consider a LP (lumbar puncture/spinal tap) with fluid pressure testing and routine fluid testing.  4.  We may consider starting you on a medication to help keep your spinal fluid pressure at bay. I don't suggest any new medication from my end of things quite yet. Your neurological exam is good today.   5. As you know, your vision and visual field can be affected. The most serious complication of having pseudotumor cerebri is loss of vision, which can be permanent. Work up, at least initially with the eye specialist should include: best corrected visual acuity, formal visual field testing, dilated fundus examination with optic disc photographs, and often optical coherence tomography (OCT) of the optic nerve, retinal nerve fiber layer, and macular ganglion cell layer. Worsening vision is an indication for intensifying treatment.  6.   Please get back on your CPAP, as you are not sleeping well and your BP has been up. Untreated OSA can cause or worsen headaches. If needed, please make an appointment with your sleep specialist.  7. Keep your appointment with cardiology; they may make additional recommendations for BP management. Elevated BP can be part of the cause of your headaches.  8. Reduce your caffeine intake by reducing your tea and/or coffee intake and limit yourself to up to 2 servings/day.  9. Reduce your daily alcohol consumption as this will worsen your sleep disturbance. Limit alcohol to less than 1 serving/day.  10.  Please work on complete smoking cessation including black and milds. 11. Follow up in this clinic in 3 months, sooner if needed.

## 2023-04-25 NOTE — Progress Notes (Signed)
Subjective:    Patient ID: Cindy Brewer is a 52 y.o. female.  HPI    Cindy Foley, MD, PhD Cindy Brewer [address] [address] [place], [place] [address]  Dear Dr. Caralee Ates,  I saw your patient, Cindy Brewer, upon your kind request either my neurologic clinic today for initial consultation of her recurrent headaches and blurry vision.  The patient is unaccompanied today.  As you know, Cindy Brewer is a 52 year old female with an underlying medical history of OSA, palpitations, anemia, arthritis, breast cancer with status post lumpectomy, reflux disease, depression, anxiety, and borderline overweight state, who reports recurrent headaches for the past at least 2 weeks.  She has had no sudden onset one-sided weakness or numbness or tingling or droopy face or slurring of speech, no double vision, has had intermittent blurry vision, has had problems with her eyes for years, especially after she had bilateral retinal detachments in 2019 requiring bilateral emergency surgeries with [facility] retina specialist.  She reports no recent nausea or vomiting.  She reports a fall about 2 weeks ago, did not go to the hospital/ER at the time.   I reviewed your office note from 04/21/2023. She had a recent head CT without contrast through [facility] outpatient imaging center on 04/19/2023 and I reviewed the results:    IMPRESSION: 1. No evidence of an acute intracranial abnormality. 2. Partially empty sella turcica. This finding can reflect incidental anatomic variation, or alternatively, it can be associated with chronic idiopathic intracranial hypertension (pseudotumor cerebri). 3. Paranasal sinus disease at the imaged levels, as described. She had labs through your office on 04/21/2023, and I reviewed the results: Stool for C. difficile was negative, other stool tests including Salmonella and Shigella were negative so far, final result on ova and parasites are  pending.  She had blood work through oncology, Cindy Brewer, on 12/06/2022 including CA 15-3 which was in the normal range, B12 level was below normal at 173, CA 20 7.29 was in the normal range, folate below normal at 5.2, CMP benign, CBC with differential showed elevated MCV at  103.4, elevated MCH at 34.9.   She is currently not on B12 injections, she reports that her insurance would not cover B12 injections as far she understood.  She is taking oral vitamin B12 supplements and folic acid.  She reports that her blood pressure has been elevated.  She admits to not sleeping well.  She has a history of sleep apnea but has not been consistent with her CPAP machine.  Some 2 months ago she got 2 puppies and she is sleeping in the family room and is currently training the puppies, she has not been using her CPAP machine for at least 2 months.  She has not seen her sleep specialist for this, she had an appointment with her sleep specialist via video visit last time.  I reviewed her chart, she saw the pulmonary nurse practitioner in April 2024, she was supposed to get set up on PAP therapy at the time.  No follow-up visit is scheduled as far as I can see.    She had an appointment with her eye specialist, Cindy Brewer in March, she had a change in her reading glasses as well as her prescription contacts at the time but has not seen him for blurry vision and has not seen him since she had the CT scan of her head.  She is currently not on any prescription medications for headaches.  She drinks  caffeine in the form of coffee, 1 serving per day and iced tea about 2 glasses/day.  She admits to drinking alcohol daily, 4 tequila shots and admits that it helps her go to sleep.  She is single and lives alone.  She does not smoke cigarettes but smokes 1 black and milds per day typically.  She is supposed to see cardiology tomorrow for history of palpitations.  Her Past Medical History Is Significant For: Past Medical History:   Diagnosis Date   Anemia    Anxiety    Arthritis    right knee    Cancer (HCC) 2019   BREAST- Left   Depression    Fibroid    GERD (gastroesophageal reflux disease)     Her Past Surgical History Is Significant For: Past Surgical History:  Procedure Laterality Date   BREAST BIOPSY Left 2019   IDC   BREAST LUMPECTOMY WITH AXILLARY LYMPH NODE BIOPSY Left 11/24/2017   Procedure: LEFT BREAST LUMPECTOMY WITH LEFT SENTINEL NODE LYMPH NODE BIOPSY;  Surgeon: Griselda Miner, MD;  Location: [facility] ORS;  Service: General;  Laterality: Left;   CESAREAN SECTION     EYE SURGERY     DETACHED RETINA 4/519   HYSTEROSCOPY WITH D & C N/A 03/14/2019   Procedure: DILATATION AND CURETTAGE Cindy Brewer;  Surgeon: Cindy Novak, MD;  Location: [facility] ORS;  Service: Gynecology;  Laterality: N/A;   IR EMBO TUMOR ORGAN ISCHEMIA INFARCT INC GUIDE ROADMAPPING      Her Family History Is Significant For: Family History  Problem Relation Age of Onset   Kidney disease Mother    Heart disease Mother    Stroke Mother    Hepatitis C Mother    Dementia Father    Blindness Father    Kidney disease Sister    Schizophrenia Brother    Hypertension Brother    Diabetes Brother    Schizophrenia Paternal Aunt    Suicidality Paternal Aunt    Stroke Maternal Grandmother    Breast cancer Neg Hx     Her Social History Is Significant For: Social History   Socioeconomic History   Marital status: Divorced    Spouse name: Not on file   Number of children: 5   Years of education: Not on file   Highest education level: Associate degree: occupational, Scientist, product/process development, or vocational program  Occupational History   Not on file  Tobacco Use   Smoking status: Former    Current packs/day: 0.00    Average packs/day: 0.3 packs/day for 13.0 years (3.3 ttl pk-yrs)    Types: Cigars, Cigarettes    Start date: 05/04/2009    Quit date: 05/04/2022    Years since quitting: 0.9   Smokeless tobacco: Never   Tobacco  comments:    Pt states 1 black and mild daily   Vaping Use   Vaping status: Never Used  Substance and Sexual Activity   Alcohol use: Yes    Alcohol/week: 12.0 standard drinks of alcohol    Types: 12 Shots of liquor per week    Comment: weekends   Drug use: No   Sexual activity: Not Currently    Birth control/protection: None  Other Topics Concern   Not on file  Social History Narrative   ** Merged History Encounter **       Social Determinants of Health   Financial Resource Strain: Low Risk  (04/21/2023)   Overall Financial Resource Strain (CARDIA)    Difficulty of Paying Living Expenses: Not  hard at all  Food Insecurity: No Food Insecurity (04/21/2023)   Hunger Vital Sign    Worried About Running Out of Food in the Last Year: Never true    Ran Out of Food in the Last Year: Never true  Transportation Needs: No Transportation Needs (04/21/2023)   PRAPARE - Administrator, Civil Service (Medical): No    Lack of Transportation (Non-Medical): No  Physical Activity: Sufficiently Active (04/21/2023)   Exercise Vital Sign    Days of Exercise per Week: 3 days    Minutes of Exercise per Session: 100 min  Recent Concern: Physical Activity - Insufficiently Active (02/09/2023)   Exercise Vital Sign    Days of Exercise per Week: 2 days    Minutes of Exercise per Session: 30 min  Stress: Stress Concern Present (04/21/2023)   [employer] of Occupational Health - Occupational Stress Questionnaire    Feeling of Stress : Rather much  Social Connections: Socially Isolated (04/21/2023)   Social Connection and Isolation Panel [NHANES]    Frequency of Communication with Friends and Family: Three times a week    Frequency of Social Gatherings with Friends and Family: Never    Attends Religious Services: Never    Database administrator or Organizations: No    Attends Banker Meetings: Never    Marital Status: Divorced    Her Allergies Are:  Allergies   Allergen Reactions   Demerol [Meperidine] Anaphylaxis    Pt does not tolerate pain killers.   Oxycodone Anaphylaxis   Sulfa Antibiotics Anaphylaxis   Percocet [Oxycodone-Acetaminophen] Swelling  :   Her Current Medications Are:  Outpatient Encounter Medications as of 04/25/2023  Medication Sig   Acetaminophen (TYLENOL PO) Take by mouth as needed.   amLODipine (NORVASC) 5 MG tablet Take 1 tablet (5 mg total) by mouth daily.   cyanocobalamin (VITAMIN B12) 1000 MCG tablet Take 1 tablet (1,000 mcg total) by mouth daily.   escitalopram (LEXAPRO) 20 MG tablet Take 1 tablet (20 mg total) by mouth daily.   folic acid (FOLVITE) 1 MG tablet Take 1 tablet (1 mg total) by mouth daily.   hydrochlorothiazide (HYDRODIURIL) 25 MG tablet Take 1 tablet (25 mg total) by mouth daily.   metoprolol tartrate (LOPRESSOR) 25 MG tablet TAKE 1 TABLET BY MOUTH TWICE A DAY   Multiple Vitamin (MULTIVITAMIN WITH MINERALS) TABS tablet Take 1 tablet by mouth daily.   omeprazole (PRILOSEC) 20 MG capsule Take 1 capsule (20 mg total) by mouth daily as needed (acid reflux).   tiZANidine (ZANAFLEX) 4 MG tablet Take 1 tablet (4 mg total) by mouth every 6 (six) hours as needed for muscle spasms. (Patient taking differently: Take 4 mg by mouth as needed for muscle spasms.)   No facility-administered encounter medications on file as of 04/25/2023.  :   Review of Systems:  Out of a complete 14 point review of systems, all are reviewed and negative with the exception of these symptoms as listed below:  Review of Systems  Neurological:        Pt states had a fall last week Pt states had CT scan Pt states CT scan showed IIH Pt states have headaches daily     Objective:  Neurological Exam  Physical Exam Physical Examination:   Vitals:   04/25/23 1033  BP: (!) 145/96  Pulse: 85    General Examination: The patient is a very pleasant 52 y.o. female in no acute distress. She appears well-developed and well-nourished  and well groomed.   HEENT: Normocephalic, atraumatic, pupils are equal, round and reactive to light, no obvious papilledema on funduscopic exam.  No photophobia.  Extraocular tracking is good without limitation to gaze excursion or nystagmus noted. Hearing is grossly intact. Face is symmetric with normal facial animation and normal facial sensation to light touch, temperature and vibration sense. Speech is clear with no dysarthria noted. There is no hypophonia. There is no lip, neck/head, jaw or voice tremor. Neck is supple with full range of passive and active motion. There are no carotid bruits on auscultation. Oropharynx exam reveals: mild mouth dryness, adequate dental hygiene and moderate airway crowding. Tongue protrudes centrally and palate elevates symmetrically.   Chest: Clear to auscultation without wheezing, rhonchi or crackles noted.  Heart: S1+S2+0, regular and normal without murmurs, rubs or gallops noted.   Abdomen: Soft, non-tender and non-distended.  Extremities: There is no pitting edema in the distal lower extremities bilaterally.   Skin: Warm and dry without trophic changes noted.   Musculoskeletal: exam reveals no obvious joint deformities.   Neurologically:  Mental status: The patient is awake, alert and oriented in all 4 spheres. Her immediate and remote memory, attention, language skills and fund of knowledge are appropriate. There is no evidence of aphasia, agnosia, apraxia or anomia. Speech is clear with normal prosody and enunciation. Thought process is linear. Mood is normal and affect is normal.  Cranial nerves II - XII are as described above under HEENT exam.  Motor exam: Normal bulk, strength and tone is noted. There is no drift, no rebound, no postural or action tremor.  No resting tremor.   Fine motor skills and coordination: Normal finger taps, hand movements and rapid alternating patting in both upper extremities, normal foot taps bilaterally in the lower  extremities.   Reflexes are 1-2+ bilaterally in the upper and lower extremities, toes are downgoing bilaterally.    Cerebellar testing: No dysmetria or intention tremor. There is no truncal or gait ataxia.  Normal heel-to-shin, normal finger-to-nose bilaterally. Sensory exam: intact to light touch, temperature and vibration sense in the upper and lower extremities.  Romberg is negative. Gait, station and balance: She stands easily. No veering to one side is noted. No leaning to one side is noted. Posture is age-appropriate and stance is narrow based. Gait shows normal stride length and normal pace. No problems turning are noted.  Tandem walk unremarkable.  Assessment and Plan:   In summary, Jameria Jou is a very pleasant 52 y.o.-year old female with an underlying medical history of OSA, palpitations, anemia, arthritis, breast cancer with status post lumpectomy, reflux disease, depression, anxiety, and borderline overweight state, who presents for evaluation of her recurrent headaches for the past few weeks.  History and examination not telltale for pseudotumor cerebri, she does not have any papilledema on my exam but further investigation is warranted.  I believe that her headaches are likely combination type headache from multiple etiologies including caffeine overuse, poor sleep, untreated obstructive sleep apnea, blood pressure elevation, stress.  I had a long discussion with the patient today and we addressed multiple issues, this was an extended visit of over 70 minutes with multiple issues addressed and extensive counseling and coordination of care.  Below is a summary of my recommendations and discussion points with the patient.  The patient was given these instructions verbally and in her MyChart after visit summary which she will access:   << 1. We will do another scan called a CT venogram of  the head.  It looks at the veins and drainage system of the brain. 2.  Please get a formal  eye examination, you can make an appointment with Cindy Brewer and ask for a full, dilated eye exam, including visual field testing, to see if you have had any loss of peripheral vision and to see if you have papilledema (swelling of the eye nerves - one or both).  3. We may consider a LP (lumbar puncture/spinal tap) with fluid pressure testing and routine fluid testing.  4.  We may consider starting you on a medication to help keep your spinal fluid pressure at bay. I don't suggest any new medication from my end of things quite yet. Your neurological exam is good today.   5. As you know, your vision and visual field can be affected. The most serious complication of having pseudotumor cerebri is loss of vision, which can be permanent. Work up, at least initially with the eye specialist should include: best corrected visual acuity, formal visual field testing, dilated fundus examination with optic disc photographs, and often optical coherence tomography (OCT) of the optic nerve, retinal nerve fiber layer, and macular ganglion cell layer. Worsening vision is an indication for intensifying treatment.  6.  Please get back on your CPAP, as you are not sleeping well and your BP has been up. Untreated OSA can cause or worsen headaches. If needed, please make an appointment with your sleep specialist.  7. Keep your appointment with cardiology; they may make additional recommendations for BP management. Elevated BP can be part of the cause of your headaches.  8. Reduce your caffeine intake by reducing your tea and/or coffee intake and limit yourself to up to 2 servings/day.  9. Reduce your daily alcohol consumption as this will worsen your sleep disturbance. Limit alcohol to less than 1 serving/day.  10.  Please work on complete smoking cessation including black and milds. 11. Follow up in this clinic in 3 months, sooner if needed.   >>   Thank you very much for allowing me to participate in the care of this nice  patient. If I can be of any further assistance to you please do not hesitate to call me at [phone].  Sincerely,   Cindy Foley, MD, PhD

## 2023-04-26 DIAGNOSIS — F17209 Nicotine dependence, unspecified, with unspecified nicotine-induced disorders: Secondary | ICD-10-CM | POA: Diagnosis not present

## 2023-04-26 DIAGNOSIS — R5383 Other fatigue: Secondary | ICD-10-CM | POA: Diagnosis not present

## 2023-04-26 DIAGNOSIS — F419 Anxiety disorder, unspecified: Secondary | ICD-10-CM | POA: Diagnosis not present

## 2023-04-26 DIAGNOSIS — R9431 Abnormal electrocardiogram [ECG] [EKG]: Secondary | ICD-10-CM | POA: Diagnosis not present

## 2023-04-26 DIAGNOSIS — I10 Essential (primary) hypertension: Secondary | ICD-10-CM | POA: Diagnosis not present

## 2023-04-26 DIAGNOSIS — R002 Palpitations: Secondary | ICD-10-CM | POA: Diagnosis not present

## 2023-04-26 DIAGNOSIS — E782 Mixed hyperlipidemia: Secondary | ICD-10-CM | POA: Diagnosis not present

## 2023-04-26 DIAGNOSIS — R5381 Other malaise: Secondary | ICD-10-CM | POA: Diagnosis not present

## 2023-04-27 ENCOUNTER — Encounter: Payer: Self-pay | Admitting: Oncology

## 2023-04-27 LAB — SALMONELLA/SHIGELLA CULT, CAMPY EIA AND SHIGA TOXIN RFL ECOLI
MICRO NUMBER:: 15617863
MICRO NUMBER:: 15617864
MICRO NUMBER:: 15617864
SHIGA RESULT:: NOT DETECTED
SPECIMEN QUALITY:: ADEQUATE
SPECIMEN QUALITY:: ADEQUATE
SPECIMEN QUALITY:: ADEQUATE
SS RESULT:: NOT DETECTED

## 2023-04-27 LAB — C. DIFFICILE GDH AND TOXIN A/B
GDH ANTIGEN: NOT DETECTED
MICRO NUMBER:: 15617758
SPECIMEN QUALITY:: ADEQUATE
TOXIN A AND B: NOT DETECTED

## 2023-04-27 LAB — OVA AND PARASITE EXAMINATION
CONCENTRATE RESULT:: NONE SEEN
MICRO NUMBER:: 15617200
SPECIMEN QUALITY:: ADEQUATE
TRICHROME RESULT:: NONE SEEN

## 2023-05-01 ENCOUNTER — Telehealth: Payer: Self-pay | Admitting: Neurology

## 2023-05-01 NOTE — Telephone Encounter (Signed)
Cohere Berkley Harvey: [phone] exp. 05/01/23-06/30/23 sent to GI [phone]

## 2023-05-02 ENCOUNTER — Ambulatory Visit: Payer: Medicare HMO | Admitting: Plastic Surgery

## 2023-05-05 ENCOUNTER — Encounter: Payer: Self-pay | Admitting: Oncology

## 2023-05-05 ENCOUNTER — Other Ambulatory Visit: Payer: Self-pay | Admitting: Internal Medicine

## 2023-05-05 DIAGNOSIS — I10 Essential (primary) hypertension: Secondary | ICD-10-CM

## 2023-05-09 ENCOUNTER — Telehealth: Payer: Self-pay | Admitting: Internal Medicine

## 2023-05-09 ENCOUNTER — Ambulatory Visit
Admission: RE | Admit: 2023-05-09 | Discharge: 2023-05-09 | Disposition: A | Discharge status: Home / Self Care | Payer: Medicare HMO | Source: Ambulatory Visit | Attending: Internal Medicine | Admitting: Internal Medicine

## 2023-05-09 ENCOUNTER — Inpatient Hospital Stay: Admission: RE | Admit: 2023-05-09 | Payer: Medicare HMO | Source: Ambulatory Visit

## 2023-05-09 DIAGNOSIS — I10 Essential (primary) hypertension: Secondary | ICD-10-CM | POA: Insufficient documentation

## 2023-05-09 NOTE — Telephone Encounter (Signed)
Pt was to be seen at [facility] and she said that when she arrived she did not like the vibe of the person checking her in and she left. Pt is asking to be set up with another place for her xray. Please advise patient.

## 2023-05-09 NOTE — Telephone Encounter (Signed)
Left vm will need to call cardio to notify them as they are the ones who ordered

## 2023-05-24 DIAGNOSIS — R9431 Abnormal electrocardiogram [ECG] [EKG]: Secondary | ICD-10-CM | POA: Diagnosis not present

## 2023-05-24 DIAGNOSIS — R5383 Other fatigue: Secondary | ICD-10-CM | POA: Diagnosis not present

## 2023-05-24 DIAGNOSIS — F17209 Nicotine dependence, unspecified, with unspecified nicotine-induced disorders: Secondary | ICD-10-CM | POA: Diagnosis not present

## 2023-05-24 DIAGNOSIS — R002 Palpitations: Secondary | ICD-10-CM | POA: Diagnosis not present

## 2023-05-24 DIAGNOSIS — R5381 Other malaise: Secondary | ICD-10-CM | POA: Diagnosis not present

## 2023-05-24 DIAGNOSIS — F419 Anxiety disorder, unspecified: Secondary | ICD-10-CM | POA: Diagnosis not present

## 2023-05-24 DIAGNOSIS — E782 Mixed hyperlipidemia: Secondary | ICD-10-CM | POA: Diagnosis not present

## 2023-05-24 DIAGNOSIS — I10 Essential (primary) hypertension: Secondary | ICD-10-CM | POA: Diagnosis not present

## 2023-06-06 ENCOUNTER — Other Ambulatory Visit (INDEPENDENT_AMBULATORY_CARE_PROVIDER_SITE_OTHER): Payer: Self-pay | Admitting: Internal Medicine

## 2023-06-06 DIAGNOSIS — I10 Essential (primary) hypertension: Secondary | ICD-10-CM

## 2023-06-07 ENCOUNTER — Inpatient Hospital Stay: Payer: Medicare HMO | Attending: Oncology

## 2023-06-07 DIAGNOSIS — D509 Iron deficiency anemia, unspecified: Secondary | ICD-10-CM | POA: Diagnosis not present

## 2023-06-07 DIAGNOSIS — Z87891 Personal history of nicotine dependence: Secondary | ICD-10-CM | POA: Diagnosis not present

## 2023-06-07 DIAGNOSIS — Z853 Personal history of malignant neoplasm of breast: Secondary | ICD-10-CM | POA: Insufficient documentation

## 2023-06-07 DIAGNOSIS — Z79899 Other long term (current) drug therapy: Secondary | ICD-10-CM | POA: Insufficient documentation

## 2023-06-07 DIAGNOSIS — E538 Deficiency of other specified B group vitamins: Secondary | ICD-10-CM | POA: Diagnosis not present

## 2023-06-07 DIAGNOSIS — C50812 Malignant neoplasm of overlapping sites of left female breast: Secondary | ICD-10-CM

## 2023-06-07 LAB — CMP (CANCER CENTER ONLY)
ALT: 21 U/L (ref 0–44)
AST: 24 U/L (ref 15–41)
Albumin: 4.2 g/dL (ref 3.5–5.0)
Alkaline Phosphatase: 42 U/L (ref 38–126)
Anion gap: 11 (ref 5–15)
BUN: 17 mg/dL (ref 6–20)
CO2: 27 mmol/L (ref 22–32)
Calcium: 9.4 mg/dL (ref 8.9–10.3)
Chloride: 99 mmol/L (ref 98–111)
Creatinine: 1.01 mg/dL — ABNORMAL HIGH (ref 0.44–1.00)
GFR, Estimated: >60 mL/min (ref >60)
Glucose, Bld: 116 mg/dL — ABNORMAL HIGH (ref 70–99)
Potassium: 2.9 mmol/L — ABNORMAL LOW (ref 3.5–5.1)
Sodium: 137 mmol/L (ref 135–145)
Total Bilirubin: 0.9 mg/dL (ref <1.2)
Total Protein: 8.3 g/dL — ABNORMAL HIGH (ref 6.5–8.1)

## 2023-06-07 LAB — CBC WITH DIFFERENTIAL (CANCER CENTER ONLY)
Abs Immature Granulocytes: 0.02 10*3/uL (ref 0.00–0.07)
Basophils Absolute: 0 10*3/uL (ref 0.0–0.1)
Basophils Relative: 1 %
Eosinophils Absolute: 0.1 10*3/uL (ref 0.0–0.5)
Eosinophils Relative: 1 %
HCT: 41.4 % (ref 36.0–46.0)
Hemoglobin: 14.1 g/dL (ref 12.0–15.0)
Immature Granulocytes: 0 %
Lymphocytes Relative: 23 %
Lymphs Abs: 1.6 10*3/uL (ref 0.7–4.0)
MCH: 35.2 pg — ABNORMAL HIGH (ref 26.0–34.0)
MCHC: 34.1 g/dL (ref 30.0–36.0)
MCV: 103.2 fL — ABNORMAL HIGH (ref 80.0–100.0)
Monocytes Absolute: 0.6 10*3/uL (ref 0.1–1.0)
Monocytes Relative: 8 %
Neutro Abs: 4.8 10*3/uL (ref 1.7–7.7)
Neutrophils Relative %: 67 %
Platelet Count: 236 10*3/uL (ref 150–400)
RBC: 4.01 MIL/uL (ref 3.87–5.11)
RDW: 11.7 % (ref 11.5–15.5)
WBC Count: 7.1 10*3/uL (ref 4.0–10.5)
nRBC: 0 % (ref 0.0–0.2)

## 2023-06-07 LAB — FOLATE: Folate: 10.5 ng/mL (ref >5.9)

## 2023-06-07 LAB — VITAMIN B12: Vitamin B-12: 294 pg/mL (ref 180–914)

## 2023-06-08 ENCOUNTER — Ambulatory Visit
Admission: RE | Admit: 2023-06-08 | Discharge: 2023-06-08 | Disposition: A | Discharge status: Home / Self Care | Payer: Medicare HMO | Source: Ambulatory Visit | Attending: Neurology | Admitting: Neurology

## 2023-06-08 ENCOUNTER — Encounter: Payer: Self-pay | Admitting: Oncology

## 2023-06-08 DIAGNOSIS — G4489 Other headache syndrome: Secondary | ICD-10-CM

## 2023-06-08 DIAGNOSIS — Z789 Other specified health status: Secondary | ICD-10-CM

## 2023-06-08 DIAGNOSIS — H538 Other visual disturbances: Secondary | ICD-10-CM | POA: Diagnosis not present

## 2023-06-08 DIAGNOSIS — R030 Elevated blood-pressure reading, without diagnosis of hypertension: Secondary | ICD-10-CM

## 2023-06-08 DIAGNOSIS — Z780 Asymptomatic menopausal state: Secondary | ICD-10-CM | POA: Diagnosis not present

## 2023-06-08 DIAGNOSIS — E236 Other disorders of pituitary gland: Secondary | ICD-10-CM | POA: Diagnosis not present

## 2023-06-08 DIAGNOSIS — R519 Headache, unspecified: Secondary | ICD-10-CM

## 2023-06-08 DIAGNOSIS — G4733 Obstructive sleep apnea (adult) (pediatric): Secondary | ICD-10-CM | POA: Diagnosis not present

## 2023-06-08 LAB — CANCER ANTIGEN 27.29: CA 27.29: 22.3 U/mL (ref 0.0–38.6)

## 2023-06-08 LAB — CANCER ANTIGEN 15-3: CA 15-3: 18.3 U/mL (ref 0.0–25.0)

## 2023-06-08 MED ORDER — IOPAMIDOL (ISOVUE-370) INJECTION 76%
200.0000 mL | Freq: Once | INTRAVENOUS | Status: AC | PRN
  Administered 2023-06-08: 75 mL via INTRAVENOUS

## 2023-06-09 ENCOUNTER — Inpatient Hospital Stay: Payer: Medicare HMO

## 2023-06-09 ENCOUNTER — Encounter: Payer: Self-pay | Admitting: Oncology

## 2023-06-09 ENCOUNTER — Inpatient Hospital Stay (HOSPITAL_BASED_OUTPATIENT_CLINIC_OR_DEPARTMENT_OTHER): Payer: Medicare HMO | Admitting: Oncology

## 2023-06-09 VITALS — BP 129/98 | HR 60 | Temp 98.8°F | Resp 18 | Wt 151.6 lb

## 2023-06-09 DIAGNOSIS — C50812 Malignant neoplasm of overlapping sites of left female breast: Secondary | ICD-10-CM | POA: Diagnosis not present

## 2023-06-09 DIAGNOSIS — Z853 Personal history of malignant neoplasm of breast: Secondary | ICD-10-CM | POA: Diagnosis not present

## 2023-06-09 DIAGNOSIS — Z87891 Personal history of nicotine dependence: Secondary | ICD-10-CM | POA: Diagnosis not present

## 2023-06-09 DIAGNOSIS — D509 Iron deficiency anemia, unspecified: Secondary | ICD-10-CM | POA: Diagnosis not present

## 2023-06-09 DIAGNOSIS — E538 Deficiency of other specified B group vitamins: Secondary | ICD-10-CM | POA: Diagnosis not present

## 2023-06-09 DIAGNOSIS — E876 Hypokalemia: Secondary | ICD-10-CM | POA: Diagnosis not present

## 2023-06-09 DIAGNOSIS — D7589 Other specified diseases of blood and blood-forming organs: Secondary | ICD-10-CM | POA: Diagnosis not present

## 2023-06-09 DIAGNOSIS — Z79899 Other long term (current) drug therapy: Secondary | ICD-10-CM | POA: Diagnosis not present

## 2023-06-09 DIAGNOSIS — Z171 Estrogen receptor negative status [ER-]: Secondary | ICD-10-CM | POA: Diagnosis not present

## 2023-06-09 LAB — BASIC METABOLIC PANEL - CANCER CENTER ONLY
Anion gap: 11 (ref 5–15)
BUN: 16 mg/dL (ref 6–20)
CO2: 26 mmol/L (ref 22–32)
Calcium: 9.3 mg/dL (ref 8.9–10.3)
Chloride: 101 mmol/L (ref 98–111)
Creatinine: 1.04 mg/dL — ABNORMAL HIGH (ref 0.44–1.00)
GFR, Estimated: >60 mL/min (ref >60)
Glucose, Bld: 157 mg/dL — ABNORMAL HIGH (ref 70–99)
Potassium: 3.5 mmol/L (ref 3.5–5.1)
Sodium: 138 mmol/L (ref 135–145)

## 2023-06-09 NOTE — Assessment & Plan Note (Addendum)
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  Labs reviewed and discussed patient.  Cancer markers are within normal limits. Continue annual mammogram surveillance. -over due in November 2024- missed appt. reschedule Recommend patient to continue calcium and vitamin D supplementation.   Patient desires breast reduction, refer to plastic surgery - there is plan in April 2025

## 2023-06-09 NOTE — Assessment & Plan Note (Signed)
Repeat BMP today showed resolution of hypokalemia

## 2023-06-09 NOTE — Assessment & Plan Note (Addendum)
She does not  take vitamin B12 1000 mcg daily consistently.  Recommend B12 injections monthly

## 2023-06-09 NOTE — Progress Notes (Signed)
Hematology/Oncology Progress note [phone] [phone]      Patient Care Team: Margarita Mail, DO as PCP - General (Internal Medicine) Patient, No Pcp Per (General Practice) Rickard Patience, MD as Consulting Physician (Hematology and Oncology)  ASSESSMENT & PLAN:   Cancer Staging  Malignant neoplasm of overlapping sites of left breast in female, estrogen receptor negative (HCC) Staging form: Breast, AJCC 8th Edition - Clinical stage from 08/25/2017: Stage IIB (cT2(2), cN0, cM0, G3, ER-, PR-, HER2-) - Signed by Rickard Patience, MD on 08/25/2017  Malignant neoplasm of overlapping sites of left breast in female, estrogen receptor negative (HCC) # 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  Labs reviewed and discussed patient.  Cancer markers are within normal limits. Continue annual mammogram surveillance. -over due in November 2024- missed appt. reschedule Recommend patient to continue calcium and vitamin D supplementation.   Patient desires breast reduction, refer to plastic surgery - there is plan in April 2025  B12 deficiency She does not  take vitamin B12 1000 mcg daily consistently.  Recommend B12 injections monthly   Macrocytosis without anemia Folate deficiency has resolved. Stopped ETOH.  Continue B12 supplementation  Hypokalemia Repeat BMP today showed resolution of hypokalemia  Orders Placed This Encounter  Procedures   Basic Metabolic Panel - Cancer Center Only    Standing Status:   Future    Number of Occurrences:   1    Standing Expiration Date:   06/08/2024   CBC with Differential (Cancer Center Only)    Standing Status:   Future    Standing Expiration Date:   06/08/2024   CMP (Cancer Center only)    Standing Status:   Future    Standing Expiration Date:   06/08/2024   Cancer antigen 27.29    Standing Status:   Future    Standing Expiration Date:    06/08/2024   Cancer antigen 15-3    Standing Status:   Future    Standing Expiration Date:   06/08/2024   Vitamin B12    Standing Status:   Future    Standing Expiration Date:   06/08/2024   Follow up in 6 months.   All questions were answered. The patient knows to call the clinic with any problems, questions or concerns.  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 two separate foci of invasive mammary carcinoma with associated tumor necrosis, DCIS present, fibroadenoma 12mm, Foci 1 is 40mm, and foci 2 is 35mm, margins are negative. Sentinel LN 0/0 involved. Negative LVI declined neoadjuvant or adjuvant chemotherapy  # Genetic testing: BRCA neg Testing did not reveal a pathogenic mutation in any of the genes analyzed.  #Iron deficiency anemia status post multiple IV iron infusions. # Uterus Fibroid disease;  underwent endometrial biopsy establish care with GYN Dr. Jean Rosenthal and had D&C, polypectomy, myomectomy   # Thyroid nodule, previously ordered thyroid ultrasound not done.  05/31/21 Bilateral mammogram showed no mammographic evidence of malignancy   INTERVAL HISTORY Cindy Brewer is a 52 y.o. female who has above history reviewed by me today presents for follow up visit for Triple negative breast cancer  Patient reports feeling well.   She reports  that she has stopped alcohol .  Review of Systems  Constitutional:  Negative for appetite change, chills, fatigue and fever.  HENT:   Negative for hearing loss and voice change.   Eyes:  Negative for eye problems.  Respiratory:  Negative for chest tightness, cough and shortness of breath.   Cardiovascular:  Negative for chest pain.  Gastrointestinal:   Negative for abdominal distention, abdominal pain and blood in stool.  Endocrine: Negative for hot flashes.  Genitourinary:  Negative for difficulty urinating and frequency.   Musculoskeletal:  Negative for arthralgias.  Skin:  Negative for itching and rash.  Neurological:  Negative for extremity weakness.  Hematological:  Negative for adenopathy.  Psychiatric/Behavioral:  Negative for confusion.      MEDICAL HISTORY:  Past Medical History:  Diagnosis Date   Anemia    Anxiety    Arthritis    right knee    Cancer (HCC) 2019   BREAST- Left   Depression    Fibroid    GERD (gastroesophageal reflux disease)     SURGICAL HISTORY: Past Surgical History:  Procedure Laterality Date   BREAST BIOPSY Left 2019   IDC   BREAST LUMPECTOMY WITH AXILLARY LYMPH NODE BIOPSY Left 11/24/2017   Procedure: LEFT BREAST LUMPECTOMY WITH LEFT SENTINEL NODE LYMPH NODE BIOPSY;  Surgeon: Griselda Miner, MD;  Location: [facility] ORS;  Service: General;  Laterality: Left;   CESAREAN SECTION     EYE SURGERY     DETACHED RETINA 4/519   HYSTEROSCOPY WITH D & C N/A 03/14/2019   Procedure: DILATATION AND CURETTAGE Matt Holmes;  Surgeon: Conard Novak, MD;  Location: [facility] ORS;  Service: Gynecology;  Laterality: N/A;   IR EMBO TUMOR ORGAN ISCHEMIA INFARCT INC GUIDE ROADMAPPING      SOCIAL HISTORY: Social History   Socioeconomic History   Marital status: Divorced    Spouse name: Not on file   Number of children: 5   Years of education: Not on file   Highest education level: Associate degree: occupational, Scientist, product/process development, or vocational program  Occupational History   Not on file  Tobacco Use   Smoking status: Former    Current packs/day: 0.00    Average packs/day: 0.3 packs/day for 13.0 years (3.3 ttl pk-yrs)    Types: Cigars, Cigarettes    Start date: 05/04/2009    Quit date: 05/04/2022    Years since quitting: 1.0   Smokeless tobacco: Never   Tobacco comments:    Pt states 1 black and  mild daily   Vaping Use   Vaping status: Never Used  Substance and Sexual Activity   Alcohol use: Yes    Alcohol/week: 12.0 standard drinks of alcohol    Types: 12 Shots of liquor per week    Comment: weekends   Drug use: No   Sexual activity: Not Currently    Birth control/protection: None  Other Topics Concern   Not on file  Social History Narrative   ** Merged History Encounter **       Social Determinants of Health   Financial Resource Strain: Low Risk  (04/21/2023)   Overall Financial Resource Strain (CARDIA)    Difficulty of Paying Living Expenses: Not hard at all  Food Insecurity: No Food Insecurity (04/21/2023)   Hunger Vital Sign    Worried About Running Out of Food in the Last Year: Never true    Ran Out of Food in the Last Year: Never true  Transportation Needs: No Transportation Needs (04/21/2023)  PRAPARE - Administrator, Civil Service (Medical): No    Lack of Transportation (Non-Medical): No  Physical Activity: Sufficiently Active (04/21/2023)   Exercise Vital Sign    Days of Exercise per Week: 3 days    Minutes of Exercise per Session: 100 min  Recent Concern: Physical Activity - Insufficiently Active (02/09/2023)   Exercise Vital Sign    Days of Exercise per Week: 2 days    Minutes of Exercise per Session: 30 min  Stress: Stress Concern Present (04/21/2023)   [employer] of Occupational Health - Occupational Stress Questionnaire    Feeling of Stress : Rather much  Social Connections: Socially Isolated (04/21/2023)   Social Connection and Isolation Panel [NHANES]    Frequency of Communication with Friends and Family: Three times a week    Frequency of Social Gatherings with Friends and Family: Never    Attends Religious Services: Never    Database administrator or Organizations: No    Attends Banker Meetings: Never    Marital Status: Divorced  Catering manager Violence: Not At Risk (02/09/2023)   Humiliation, Afraid, Rape,  and Kick questionnaire    Fear of Current or Ex-Partner: No    Emotionally Abused: No    Physically Abused: No    Sexually Abused: No    FAMILY HISTORY: Family History  Problem Relation Age of Onset   Kidney disease Mother    Heart disease Mother    Stroke Mother    Hepatitis C Mother    Dementia Father    Blindness Father    Kidney disease Sister    Schizophrenia Brother    Hypertension Brother    Diabetes Brother    Schizophrenia Paternal Aunt    Suicidality Paternal Aunt    Stroke Maternal Grandmother    Breast cancer Neg Hx     ALLERGIES:  is allergic to demerol [meperidine], oxycodone, sulfa antibiotics, and percocet [oxycodone-acetaminophen].  MEDICATIONS:  Current Outpatient Medications  Medication Sig Dispense Refill   Acetaminophen (TYLENOL PO) Take by mouth as needed.     amLODipine (NORVASC) 5 MG tablet Take 1 tablet (5 mg total) by mouth daily. 90 tablet 1   cyanocobalamin (VITAMIN B12) 1000 MCG tablet Take 1 tablet (1,000 mcg total) by mouth daily. 90 tablet 1   escitalopram (LEXAPRO) 20 MG tablet Take 1 tablet (20 mg total) by mouth daily. 90 tablet 0   folic acid (FOLVITE) 1 MG tablet Take 1 tablet (1 mg total) by mouth daily. 90 tablet 1   hydrochlorothiazide (HYDRODIURIL) 25 MG tablet Take 1 tablet (25 mg total) by mouth daily. 90 tablet 1   metoprolol tartrate (LOPRESSOR) 25 MG tablet TAKE 1 TABLET BY MOUTH TWICE A DAY 180 tablet 0   Multiple Vitamin (MULTIVITAMIN WITH MINERALS) TABS tablet Take 1 tablet by mouth daily.     omeprazole (PRILOSEC) 20 MG capsule Take 1 capsule (20 mg total) by mouth daily as needed (acid reflux).     tiZANidine (ZANAFLEX) 4 MG tablet Take 1 tablet (4 mg total) by mouth every 6 (six) hours as needed for muscle spasms. (Patient taking differently: Take 4 mg by mouth as needed for muscle spasms.) 30 tablet 0   No current facility-administered medications for this visit.     PHYSICAL EXAMINATION: ECOG PERFORMANCE STATUS: 0 -  Asymptomatic Vitals:   06/09/23 1143  BP: (!) 129/98  Pulse: 60  Resp: 18  Temp: 98.8 F (37.1 C)   Filed Weights  06/09/23 1143  Weight: 151 lb 9.6 oz (68.8 kg)    Physical Exam Constitutional:      General: She is not in acute distress.    Appearance: She is not diaphoretic.  HENT:     Head: Normocephalic.  Eyes:     General: No scleral icterus. Neck:     Vascular: No JVD.  Cardiovascular:     Rate and Rhythm: Normal rate and regular rhythm.  Pulmonary:     Effort: Pulmonary effort is normal. No respiratory distress.     Breath sounds: Normal breath sounds. No wheezing.  Abdominal:     General: Bowel sounds are normal. There is no distension.     Palpations: Abdomen is soft.     Tenderness: There is no abdominal tenderness.  Musculoskeletal:        General: No tenderness. Normal range of motion.     Cervical back: Normal range of motion.  Lymphadenopathy:     Cervical: No cervical adenopathy.  Skin:    General: Skin is warm and dry.     Findings: No rash.  Neurological:     Mental Status: She is alert and oriented to person, place, and time. Mental status is at baseline.     Motor: No abnormal muscle tone.  Psychiatric:        Mood and Affect: Mood and affect normal.        Cognition and Memory: Memory normal.         LABORATORY DATA:  I have reviewed the data as listed     Latest Ref Rng & Units 06/07/2023   10:25 AM 12/06/2022    1:11 PM 06/07/2022    1:40 PM  CBC  WBC 4.0 - 10.5 K/uL 7.1  5.6  6.4   Hemoglobin 12.0 - 15.0 g/dL 81.1  91.4  78.2   Hematocrit 36.0 - 46.0 % 41.4  39.1  42.3   Platelets 150 - 400 K/uL 236  211  271       Latest Ref Rng & Units 06/09/2023   12:07 PM 06/07/2023   10:26 AM 12/06/2022    1:11 PM  CMP  Glucose 70 - 99 mg/dL 956  213  99   BUN 6 - 20 mg/dL 16  17  17    Creatinine 0.44 - 1.00 mg/dL 0.86  5.78  4.69   Sodium 135 - 145 mmol/L 138  137  138   Potassium 3.5 - 5.1 mmol/L 3.5  2.9  3.5   Chloride 98 - 111  mmol/L 101  99  106   CO2 22 - 32 mmol/L 26  27  23    Calcium 8.9 - 10.3 mg/dL 9.3  9.4  9.2   Total Protein 6.5 - 8.1 g/dL  8.3  7.8   Total Bilirubin <1.2 mg/dL  0.9  0.7   Alkaline Phos 38 - 126 U/L  42  48   AST 15 - 41 U/L  24  26   ALT 0 - 44 U/L  21  19

## 2023-06-09 NOTE — Assessment & Plan Note (Signed)
Folate deficiency has resolved. Stopped ETOH.  Continue B12 supplementation

## 2023-06-13 ENCOUNTER — Encounter: Payer: Self-pay | Admitting: Oncology

## 2023-06-14 DIAGNOSIS — H25013 Cortical age-related cataract, bilateral: Secondary | ICD-10-CM | POA: Diagnosis not present

## 2023-06-14 DIAGNOSIS — H18821 Corneal disorder due to contact lens, right eye: Secondary | ICD-10-CM | POA: Diagnosis not present

## 2023-06-14 DIAGNOSIS — H18211 Corneal edema secondary to contact lens, right eye: Secondary | ICD-10-CM | POA: Diagnosis not present

## 2023-06-15 ENCOUNTER — Encounter: Payer: Self-pay | Admitting: Oncology

## 2023-06-15 ENCOUNTER — Ambulatory Visit: Payer: Self-pay | Admitting: Professional Counselor

## 2023-06-15 NOTE — Progress Notes (Signed)
Patient no-showed today's appointment; appointment was for 06/15/23 at 1 PM to establish care in outpatient therapy.

## 2023-06-16 ENCOUNTER — Inpatient Hospital Stay: Payer: Medicare HMO

## 2023-06-16 DIAGNOSIS — D509 Iron deficiency anemia, unspecified: Secondary | ICD-10-CM | POA: Diagnosis not present

## 2023-06-16 DIAGNOSIS — Z87891 Personal history of nicotine dependence: Secondary | ICD-10-CM | POA: Diagnosis not present

## 2023-06-16 DIAGNOSIS — Z853 Personal history of malignant neoplasm of breast: Secondary | ICD-10-CM | POA: Diagnosis not present

## 2023-06-16 DIAGNOSIS — E538 Deficiency of other specified B group vitamins: Secondary | ICD-10-CM | POA: Diagnosis not present

## 2023-06-16 DIAGNOSIS — Z79899 Other long term (current) drug therapy: Secondary | ICD-10-CM | POA: Diagnosis not present

## 2023-06-16 DIAGNOSIS — C50812 Malignant neoplasm of overlapping sites of left female breast: Secondary | ICD-10-CM

## 2023-06-16 MED ORDER — CYANOCOBALAMIN 1000 MCG/ML IJ SOLN
1000.0000 ug | Freq: Once | INTRAMUSCULAR | Status: AC
  Administered 2023-06-16: 1000 ug via INTRAMUSCULAR
  Filled 2023-06-16: qty 1

## 2023-06-21 DIAGNOSIS — M9905 Segmental and somatic dysfunction of pelvic region: Secondary | ICD-10-CM | POA: Diagnosis not present

## 2023-06-21 DIAGNOSIS — M5432 Sciatica, left side: Secondary | ICD-10-CM | POA: Diagnosis not present

## 2023-06-21 DIAGNOSIS — M9903 Segmental and somatic dysfunction of lumbar region: Secondary | ICD-10-CM | POA: Diagnosis not present

## 2023-06-21 DIAGNOSIS — M9904 Segmental and somatic dysfunction of sacral region: Secondary | ICD-10-CM | POA: Diagnosis not present

## 2023-06-26 DIAGNOSIS — M9903 Segmental and somatic dysfunction of lumbar region: Secondary | ICD-10-CM | POA: Diagnosis not present

## 2023-06-26 DIAGNOSIS — M5432 Sciatica, left side: Secondary | ICD-10-CM | POA: Diagnosis not present

## 2023-06-26 DIAGNOSIS — M9904 Segmental and somatic dysfunction of sacral region: Secondary | ICD-10-CM | POA: Diagnosis not present

## 2023-06-26 DIAGNOSIS — M9905 Segmental and somatic dysfunction of pelvic region: Secondary | ICD-10-CM | POA: Diagnosis not present

## 2023-07-03 DIAGNOSIS — M9903 Segmental and somatic dysfunction of lumbar region: Secondary | ICD-10-CM | POA: Diagnosis not present

## 2023-07-03 DIAGNOSIS — M9905 Segmental and somatic dysfunction of pelvic region: Secondary | ICD-10-CM | POA: Diagnosis not present

## 2023-07-03 DIAGNOSIS — M5432 Sciatica, left side: Secondary | ICD-10-CM | POA: Diagnosis not present

## 2023-07-03 DIAGNOSIS — M9904 Segmental and somatic dysfunction of sacral region: Secondary | ICD-10-CM | POA: Diagnosis not present

## 2023-07-04 ENCOUNTER — Ambulatory Visit
Admission: RE | Admit: 2023-07-04 | Discharge: 2023-07-04 | Disposition: A | Discharge status: Home / Self Care | Payer: Medicare HMO | Source: Ambulatory Visit | Attending: Oncology | Admitting: Oncology

## 2023-07-04 DIAGNOSIS — Z171 Estrogen receptor negative status [ER-]: Secondary | ICD-10-CM | POA: Insufficient documentation

## 2023-07-04 DIAGNOSIS — C50812 Malignant neoplasm of overlapping sites of left female breast: Secondary | ICD-10-CM | POA: Insufficient documentation

## 2023-07-04 DIAGNOSIS — Z1231 Encounter for screening mammogram for malignant neoplasm of breast: Secondary | ICD-10-CM | POA: Diagnosis not present

## 2023-07-19 ENCOUNTER — Ambulatory Visit: Payer: Medicare HMO | Admitting: Podiatry

## 2023-07-21 ENCOUNTER — Encounter: Payer: Self-pay | Admitting: Oncology

## 2023-07-21 ENCOUNTER — Inpatient Hospital Stay: Payer: Medicare HMO | Attending: Oncology

## 2023-07-21 DIAGNOSIS — Z79899 Other long term (current) drug therapy: Secondary | ICD-10-CM | POA: Insufficient documentation

## 2023-07-21 DIAGNOSIS — F419 Anxiety disorder, unspecified: Secondary | ICD-10-CM | POA: Diagnosis not present

## 2023-07-21 DIAGNOSIS — C50812 Malignant neoplasm of overlapping sites of left female breast: Secondary | ICD-10-CM | POA: Diagnosis not present

## 2023-07-21 DIAGNOSIS — F32A Depression, unspecified: Secondary | ICD-10-CM | POA: Diagnosis not present

## 2023-07-21 DIAGNOSIS — Z171 Estrogen receptor negative status [ER-]: Secondary | ICD-10-CM | POA: Insufficient documentation

## 2023-07-21 DIAGNOSIS — D509 Iron deficiency anemia, unspecified: Secondary | ICD-10-CM | POA: Diagnosis not present

## 2023-07-21 DIAGNOSIS — R9230 Dense breasts, unspecified: Secondary | ICD-10-CM | POA: Insufficient documentation

## 2023-07-21 DIAGNOSIS — K219 Gastro-esophageal reflux disease without esophagitis: Secondary | ICD-10-CM | POA: Insufficient documentation

## 2023-07-21 DIAGNOSIS — D259 Leiomyoma of uterus, unspecified: Secondary | ICD-10-CM | POA: Insufficient documentation

## 2023-07-21 DIAGNOSIS — F17290 Nicotine dependence, other tobacco product, uncomplicated: Secondary | ICD-10-CM | POA: Insufficient documentation

## 2023-07-21 MED ORDER — CYANOCOBALAMIN 1000 MCG/ML IJ SOLN
1000.0000 ug | Freq: Once | INTRAMUSCULAR | Status: AC
  Administered 2023-07-21: 1000 ug via INTRAMUSCULAR
  Filled 2023-07-21: qty 1

## 2023-07-24 ENCOUNTER — Ambulatory Visit: Payer: Medicare HMO | Admitting: Internal Medicine

## 2023-07-25 ENCOUNTER — Inpatient Hospital Stay: Payer: Medicare HMO | Admitting: Oncology

## 2023-07-25 NOTE — Assessment & Plan Note (Deleted)
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  Labs reviewed and discussed patient.  Cancer markers are within normal limits. Continue annual mammogram surveillance. -over due in November 2024- missed appt. reschedule Recommend patient to continue calcium and vitamin D supplementation.   Patient desires breast reduction, refer to plastic surgery - there is plan in April 2025

## 2023-07-28 ENCOUNTER — Ambulatory Visit: Payer: Medicare HMO | Admitting: Internal Medicine

## 2023-07-31 ENCOUNTER — Encounter: Payer: Self-pay | Admitting: Oncology

## 2023-07-31 ENCOUNTER — Inpatient Hospital Stay (HOSPITAL_BASED_OUTPATIENT_CLINIC_OR_DEPARTMENT_OTHER): Payer: Medicare HMO | Admitting: Oncology

## 2023-07-31 VITALS — BP 170/114 | HR 63 | Temp 96.4°F | Resp 18

## 2023-07-31 DIAGNOSIS — D259 Leiomyoma of uterus, unspecified: Secondary | ICD-10-CM | POA: Diagnosis not present

## 2023-07-31 DIAGNOSIS — Z171 Estrogen receptor negative status [ER-]: Secondary | ICD-10-CM | POA: Diagnosis not present

## 2023-07-31 DIAGNOSIS — F419 Anxiety disorder, unspecified: Secondary | ICD-10-CM | POA: Diagnosis not present

## 2023-07-31 DIAGNOSIS — D509 Iron deficiency anemia, unspecified: Secondary | ICD-10-CM

## 2023-07-31 DIAGNOSIS — C50812 Malignant neoplasm of overlapping sites of left female breast: Secondary | ICD-10-CM

## 2023-07-31 DIAGNOSIS — K219 Gastro-esophageal reflux disease without esophagitis: Secondary | ICD-10-CM | POA: Diagnosis not present

## 2023-07-31 DIAGNOSIS — F32A Depression, unspecified: Secondary | ICD-10-CM | POA: Diagnosis not present

## 2023-07-31 DIAGNOSIS — F17290 Nicotine dependence, other tobacco product, uncomplicated: Secondary | ICD-10-CM | POA: Diagnosis not present

## 2023-07-31 DIAGNOSIS — R9230 Dense breasts, unspecified: Secondary | ICD-10-CM | POA: Diagnosis not present

## 2023-07-31 NOTE — Progress Notes (Signed)
Hematology/Oncology Progress note [phone] [phone]      Patient Care Team: Margarita Mail, DO as PCP - General (Internal Medicine) Patient, No Pcp Per (General Practice) Rickard Patience, MD as Consulting Physician (Hematology and Oncology)  ASSESSMENT & PLAN:   Cancer Staging  Malignant neoplasm of overlapping sites of left breast in female, estrogen receptor negative (HCC) Staging form: Breast, AJCC 8th Edition - Clinical stage from 08/25/2017: Stage IIB (cT2(2), cN0, cM0, G3, ER-, PR-, HER2-) - Signed by Rickard Patience, MD on 08/25/2017  Malignant neoplasm of overlapping sites of left breast in female, estrogen receptor negative (HCC) # 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  Labs reviewed and discussed patient.  Cancer markers are within normal limits. Continue annual mammogram surveillance. -over due in November 2024- missed appt. reschedule Recommend patient to continue calcium and vitamin D supplementation.   Patient desires breast reduction, refer to plastic surgery - there is plan in April 2025 Today we reviewed her recent screening mammogram results in details. Patient is concerned that her breast density has been high and mammogram may miss important findings. With her history of triple negative breast cancer, I recommend her to get bilateral MRI breast w wo contrast. She agrees.    Orders Placed This Encounter  Procedures   MR BREAST BILATERAL W WO CONTRAST INC CAD    Standing Status:   Future    Expected Date:   12/29/2023    Expiration Date:   07/30/2024    If indicated for the ordered procedure, I authorize the administration of contrast media per Radiology protocol:   Yes    What is the patient's sedation requirement?:   No Sedation    Does the patient have a pacemaker or implanted devices?:   No    Radiology Contrast Protocol - do NOT remove file path:    [email or web address]    Preferred imaging location?:   [facility] (table limit - 550lbs)   Follow up in 6 months.   All questions were answered. The patient knows to call the clinic with any problems, questions or concerns.  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 two separate foci of invasive mammary carcinoma with associated tumor necrosis, DCIS present, fibroadenoma 12mm, Foci 1 is 40mm, and foci 2 is 35mm, margins are negative. Sentinel LN 0/0 involved. Negative LVI declined neoadjuvant or adjuvant chemotherapy  # Genetic testing: BRCA neg Testing did not reveal a pathogenic mutation in any of the genes analyzed.  #Iron deficiency anemia status post multiple IV iron infusions. # Uterus Fibroid disease;  underwent endometrial biopsy establish care with GYN Dr. Jean Rosenthal and had D&C, polypectomy, myomectomy   # Thyroid nodule, previously ordered thyroid ultrasound not done.  05/31/21 Bilateral mammogram showed no mammographic evidence of malignancy   INTERVAL HISTORY Cindy Brewer is a 53 y.o. female who has above history reviewed by me today presents for follow up visit for Triple negative breast cancer  Patient reports feeling well.   She reports that she has stopped alcohol She requests a visit to go over her mammogram results.  .  Review of Systems  Constitutional:  Negative for appetite change, chills,  fatigue and fever.  HENT:   Negative for hearing loss and voice change.   Eyes:  Negative for eye problems.  Respiratory:  Negative for chest tightness, cough and shortness of breath.   Cardiovascular:  Negative for chest pain.  Gastrointestinal:   Negative for abdominal distention, abdominal pain and blood in stool.  Endocrine: Negative for hot flashes.  Genitourinary:  Negative for difficulty urinating and frequency.   Musculoskeletal:  Negative for arthralgias.  Skin:  Negative for itching and rash.  Neurological:  Negative for extremity weakness.  Hematological:  Negative for adenopathy.  Psychiatric/Behavioral:  Negative for confusion.      MEDICAL HISTORY:  Past Medical History:  Diagnosis Date   Anemia    Anxiety    Arthritis    right knee    Cancer (HCC) 2019   BREAST- Left   Depression    Fibroid    GERD (gastroesophageal reflux disease)     SURGICAL HISTORY: Past Surgical History:  Procedure Laterality Date   BREAST BIOPSY Left 2019   IDC   BREAST LUMPECTOMY WITH AXILLARY LYMPH NODE BIOPSY Left 11/24/2017   Procedure: LEFT BREAST LUMPECTOMY WITH LEFT SENTINEL NODE LYMPH NODE BIOPSY;  Surgeon: Griselda Miner, MD;  Location: [facility] ORS;  Service: General;  Laterality: Left;   CESAREAN SECTION     EYE SURGERY     DETACHED RETINA 4/519   HYSTEROSCOPY WITH D & C N/A 03/14/2019   Procedure: DILATATION AND CURETTAGE Matt Holmes;  Surgeon: Conard Novak, MD;  Location: [facility] ORS;  Service: Gynecology;  Laterality: N/A;   IR EMBO TUMOR ORGAN ISCHEMIA INFARCT INC GUIDE ROADMAPPING      SOCIAL HISTORY: Social History   Socioeconomic History   Marital status: Divorced    Spouse name: Not on file   Number of children: 5   Years of education: Not on file   Highest education level: Associate degree: occupational, Scientist, product/process development, or vocational program  Occupational History   Not on file  Tobacco Use   Smoking status: Former    Current packs/day: 0.00    Average packs/day: 0.3 packs/day for 13.0 years (3.3 ttl pk-yrs)    Types: Cigars, Cigarettes    Start date: 05/04/2009    Quit date: 05/04/2022    Years since quitting: 1.2   Smokeless tobacco: Never   Tobacco comments:    Pt states 1 black and  mild daily   Vaping Use   Vaping status: Never Used  Substance and Sexual Activity   Alcohol use: Yes    Alcohol/week: 12.0 standard drinks of alcohol    Types: 12 Shots of liquor per week    Comment: weekends   Drug use: No   Sexual activity: Not Currently    Birth control/protection: None  Other Topics Concern   Not on file  Social History Narrative   ** Merged History Encounter **       Social Drivers of Health   Financial Resource Strain: Low Risk  (04/21/2023)   Overall Financial Resource Strain (CARDIA)    Difficulty of Paying Living Expenses: Not hard at all  Food Insecurity: No Food Insecurity (04/21/2023)   Hunger Vital Sign    Worried About Running Out of Food in the Last Year: Never true    Ran Out of Food in the Last Year: Never true  Transportation Needs: No Transportation Needs (04/21/2023)   PRAPARE - Administrator, Civil Service (Medical): No    Lack of Transportation (Non-Medical):  No  Physical Activity: Sufficiently Active (04/21/2023)   Exercise Vital Sign    Days of Exercise per Week: 3 days    Minutes of Exercise per Session: 100 min  Recent Concern: Physical Activity - Insufficiently Active (02/09/2023)   Exercise Vital Sign    Days of Exercise per Week: 2 days    Minutes of Exercise per Session: 30 min  Stress: Stress Concern Present (04/21/2023)   [employer] of Occupational Health - Occupational Stress Questionnaire    Feeling of Stress : Rather much  Social Connections: Socially Isolated (04/21/2023)   Social Connection and Isolation Panel [NHANES]    Frequency of Communication with Friends and Family: Three times a week    Frequency of Social Gatherings with Friends and Family: Never    Attends Religious Services: Never    Database administrator or Organizations: No    Attends Banker Meetings: Never    Marital Status: Divorced  Catering manager Violence: Not At Risk (02/09/2023)   Humiliation, Afraid, Rape, and  Kick questionnaire    Fear of Current or Ex-Partner: No    Emotionally Abused: No    Physically Abused: No    Sexually Abused: No    FAMILY HISTORY: Family History  Problem Relation Age of Onset   Kidney disease Mother    Heart disease Mother    Stroke Mother    Hepatitis C Mother    Dementia Father    Blindness Father    Kidney disease Sister    Schizophrenia Brother    Hypertension Brother    Diabetes Brother    Schizophrenia Paternal Aunt    Suicidality Paternal Aunt    Stroke Maternal Grandmother    Breast cancer Neg Hx     ALLERGIES:  is allergic to demerol [meperidine], oxycodone, sulfa antibiotics, and percocet [oxycodone-acetaminophen].  MEDICATIONS:  Current Outpatient Medications  Medication Sig Dispense Refill   Acetaminophen (TYLENOL PO) Take by mouth as needed.     amLODipine (NORVASC) 5 MG tablet Take 1 tablet (5 mg total) by mouth daily. 90 tablet 1   cyanocobalamin (VITAMIN B12) 1000 MCG tablet Take 1 tablet (1,000 mcg total) by mouth daily. 90 tablet 1   escitalopram (LEXAPRO) 20 MG tablet Take 1 tablet (20 mg total) by mouth daily. 90 tablet 0   folic acid (FOLVITE) 1 MG tablet Take 1 tablet (1 mg total) by mouth daily. 90 tablet 1   hydrochlorothiazide (HYDRODIURIL) 25 MG tablet Take 1 tablet (25 mg total) by mouth daily. 90 tablet 1   metoprolol tartrate (LOPRESSOR) 25 MG tablet TAKE 1 TABLET BY MOUTH TWICE A DAY 180 tablet 0   Multiple Vitamin (MULTIVITAMIN WITH MINERALS) TABS tablet Take 1 tablet by mouth daily.     omeprazole (PRILOSEC) 20 MG capsule Take 1 capsule (20 mg total) by mouth daily as needed (acid reflux).     tiZANidine (ZANAFLEX) 4 MG tablet Take 1 tablet (4 mg total) by mouth every 6 (six) hours as needed for muscle spasms. (Patient taking differently: Take 4 mg by mouth as needed for muscle spasms.) 30 tablet 0   No current facility-administered medications for this visit.     PHYSICAL EXAMINATION: ECOG PERFORMANCE STATUS: 0 -  Asymptomatic Vitals:   07/31/23 1145 07/31/23 1156  BP: (!) 176/111 (!) 170/114  Pulse: 63   Resp: 18   Temp: (!) 96.4 F (35.8 C)   SpO2: 100%    There were no vitals filed for this visit.  Physical Exam Constitutional:      General: She is not in acute distress.    Appearance: She is not diaphoretic.  HENT:     Head: Normocephalic.  Eyes:     General: No scleral icterus. Neck:     Vascular: No JVD.  Cardiovascular:     Rate and Rhythm: Normal rate.  Pulmonary:     Effort: Pulmonary effort is normal. No respiratory distress.     Breath sounds: No wheezing.  Abdominal:     General: There is no distension.  Musculoskeletal:        General: Normal range of motion.     Cervical back: Normal range of motion.  Lymphadenopathy:     Cervical: No cervical adenopathy.  Skin:    Findings: No rash.  Neurological:     Mental Status: She is alert and oriented to person, place, and time. Mental status is at baseline.     Motor: No abnormal muscle tone.  Psychiatric:        Mood and Affect: Mood and affect normal.        Cognition and Memory: Memory normal.         LABORATORY DATA:  I have reviewed the data as listed     Latest Ref Rng & Units 06/07/2023   10:25 AM 12/06/2022    1:11 PM 06/07/2022    1:40 PM  CBC  WBC 4.0 - 10.5 K/uL 7.1  5.6  6.4   Hemoglobin 12.0 - 15.0 g/dL 40.9  81.1  91.4   Hematocrit 36.0 - 46.0 % 41.4  39.1  42.3   Platelets 150 - 400 K/uL 236  211  271       Latest Ref Rng & Units 06/09/2023   12:07 PM 06/07/2023   10:26 AM 12/06/2022    1:11 PM  CMP  Glucose 70 - 99 mg/dL 782  956  99   BUN 6 - 20 mg/dL 16  17  17    Creatinine 0.44 - 1.00 mg/dL 2.13  0.86  5.78   Sodium 135 - 145 mmol/L 138  137  138   Potassium 3.5 - 5.1 mmol/L 3.5  2.9  3.5   Chloride 98 - 111 mmol/L 101  99  106   CO2 22 - 32 mmol/L 26  27  23    Calcium 8.9 - 10.3 mg/dL 9.3  9.4  9.2   Total Protein 6.5 - 8.1 g/dL  8.3  7.8   Total Bilirubin <1.2 mg/dL  0.9  0.7    Alkaline Phos 38 - 126 U/L  42  48   AST 15 - 41 U/L  24  26   ALT 0 - 44 U/L  21  19

## 2023-07-31 NOTE — Assessment & Plan Note (Addendum)
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  Labs reviewed and discussed patient.  Cancer markers are within normal limits. Continue annual mammogram surveillance. -over due in November 2024- missed appt. reschedule Recommend patient to continue calcium and vitamin D supplementation.   Patient desires breast reduction, refer to plastic surgery - there is plan in April 2025 Today we reviewed her recent screening mammogram results in details. Patient is concerned that her breast density has been high and mammogram may miss important findings. With her history of triple negative breast cancer, I recommend her to get bilateral MRI breast w wo contrast. She agrees.

## 2023-08-01 ENCOUNTER — Ambulatory Visit: Payer: Medicare HMO | Admitting: Neurology

## 2023-08-01 ENCOUNTER — Encounter: Payer: Self-pay | Admitting: Neurology

## 2023-08-01 VITALS — BP 160/92 | HR 80 | Ht 66.0 in | Wt 155.0 lb

## 2023-08-01 DIAGNOSIS — E236 Other disorders of pituitary gland: Secondary | ICD-10-CM | POA: Diagnosis not present

## 2023-08-01 DIAGNOSIS — R030 Elevated blood-pressure reading, without diagnosis of hypertension: Secondary | ICD-10-CM | POA: Diagnosis not present

## 2023-08-01 DIAGNOSIS — R519 Headache, unspecified: Secondary | ICD-10-CM | POA: Diagnosis not present

## 2023-08-01 DIAGNOSIS — G4733 Obstructive sleep apnea (adult) (pediatric): Secondary | ICD-10-CM

## 2023-08-01 NOTE — Progress Notes (Signed)
Subjective:    Patient ID: Cindy Brewer is a 53 y.o. female.  HPI    Interim history:   Cindy Brewer is a 53 year old female with an underlying medical history of retinal detachment with status post surgery in 2019, OSA, palpitations, anemia, arthritis, breast cancer with status post lumpectomy, reflux disease, depression, anxiety, and borderline overweight state, who presents for follow-up consultation of her headaches.  The patient is unaccompanied today.  I first met her at the request of her primary care physician on 04/25/2023, at which time she reported recurrent headaches for the past few weeks.  She was advised to proceed with a CT venogram of the head.  She was advised to get a formal eye examination with her eye doctor with a full, dilated eye examination.  We talked about potentially pursuing a lumbar puncture.  She was advised to get back on her CPAP therapy and make an appointment for follow-up with her sleep specialist.  She was advised to reduce her caffeine intake and limit herself to up to 2 servings per day.  She was encouraged to pursue full smoking cessation.  She had a CT venogram of the head on 06/08/2023 and I reviewed the results:   IMPRESSION: 1. 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 No evidence of sellar mass. This can be a normal variant or can be seen with intracranial hypertension. 2. Otherwise normal CT appearance of the brain. 3. No definite significant finding at CT venography. There are right noise granulations at the transverse sigmoid junction regions which do encroach somewhat upon the lumens, but this is felt to be physiologic.   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 No evidence of sellar mass. This can be a normal variant or can be seen with intracranial hypertension.   Otherwise normal CT appearance of  the brain.  She was notified via MyChart message.  In addition, I personally and independently reviewed images through the PACS system.  Today, 08/01/2023: She reports feeling better with regards to her headaches.  She believes that her blood pressure control has something to do with her headaches.  She had seen her ophthalmologist, Dr. Loanne Drilling, a few times since our last visit, she is trialing new contact lenses.  She has an appointment for follow-up but does not know when.  She reports that her eye exam was benign, she was not told that she has any eye nerve swelling, official visit notes were not sent to [place] but she is agreeable to asking his office to send records.  She is using the CPAP but not consistently, has not made a follow-up appointment with her sleep specialist, had a virtual visit last year.  She saw Rubye Oaks, NP on 10/25/2022 and I reviewed the note from her virtual visit.  She was diagnosed with moderate obstructive sleep apnea and started on AutoPap therapy.  She does not drink caffeine daily.  She is trying to hydrate well with water.  She has no new concerns today.    The patient's allergies, current medications, family history, past medical history, past social history, past surgical history and problem list were reviewed and updated as appropriate.   Previously:   04/25/2023: (She) reports recurrent headaches for the past at least 2 weeks.  She has had no sudden onset one-sided weakness or numbness or tingling or droopy face or slurring of speech, no  double vision, has had intermittent blurry vision, has had problems with her eyes for years, especially after she had bilateral retinal detachments in 2019 requiring bilateral emergency surgeries with [facility] retina specialist.  She reports no recent nausea or vomiting.  She reports a fall about 2 weeks ago, did not go to the hospital/ER at the time.   I reviewed your office note from 04/21/2023. She had a recent head CT  without contrast through [facility] outpatient imaging center on 04/19/2023 and I reviewed the results:    IMPRESSION: 1. No evidence of an acute intracranial abnormality. 2. Partially empty sella turcica. This finding can reflect incidental anatomic variation, or alternatively, it can be associated with chronic idiopathic intracranial hypertension (pseudotumor cerebri). 3. Paranasal sinus disease at the imaged levels, as described. She had labs through your office on 04/21/2023, and I reviewed the results: Stool for C. difficile was negative, other stool tests including Salmonella and Shigella were negative so far, final result on ova and parasites are pending.  She had blood work through oncology, Dr. Cathie Hoops, on 12/06/2022 including CA 15-3 which was in the normal range, B12 level was below normal at 173, CA 20 7.29 was in the normal range, folate below normal at 5.2, CMP benign, CBC with differential showed elevated MCV at  103.4, elevated MCH at 34.9.    She is currently not on B12 injections, she reports that her insurance would not cover B12 injections as far she understood.  She is taking oral vitamin B12 supplements and folic acid.  She reports that her blood pressure has been elevated.  She admits to not sleeping well.  She has a history of sleep apnea but has not been consistent with her CPAP machine.  Some 2 months ago she got 2 puppies and she is sleeping in the family room and is currently training the puppies, she has not been using her CPAP machine for at least 2 months.  She has not seen her sleep specialist for this, she had an appointment with her sleep specialist via video visit last time.  I reviewed her chart, she saw the pulmonary nurse practitioner in April 2024, she was supposed to get set up on PAP therapy at the time.  No follow-up visit is scheduled as far as I can see.     She had an appointment with her eye specialist, Dr. Clydene Pugh in March, she had a change in her reading glasses as  well as her prescription contacts at the time but has not seen him for blurry vision and has not seen him since she had the CT scan of her head.  She is currently not on any prescription medications for headaches.  She drinks caffeine in the form of coffee, 1 serving per day and iced tea about 2 glasses/day.  She admits to drinking alcohol daily, 4 tequila shots and admits that it helps her go to sleep.  She is single and lives alone.  She does not smoke cigarettes but smokes 1 black and milds per day typically.  She is supposed to see cardiology tomorrow for history of palpitations.   Her Past Medical History Is Significant For: Past Medical History:  Diagnosis Date   Anemia    Anxiety    Arthritis    right knee    Cancer (HCC) 2019   BREAST- Left   Depression    Fibroid    GERD (gastroesophageal reflux disease)     Her Past Surgical History Is Significant For:  Past Surgical History:  Procedure Laterality Date   BREAST BIOPSY Left 2019   ID[facility]   BREAST LUMPECTOMY WITH AXILLARY LYMPH NODE BIOPSY Left 11/24/2017   Procedure: LEFT BREAST LUMPECTOMY WITH LEFT SENTINEL NODE LYMPH NODE BIOPSY;  Surgeon: Griselda Miner, MD;  Location: [facility] ORS;  Service: General;  Laterality: Left;   CESAREAN SECTION     EYE SURGERY     DETACHED RETINA 4/519   HYSTEROSCOPY WITH D & C N/A 03/14/2019   Procedure: DILATATION AND CURETTAGE Matt Holmes;  Surgeon: Conard Novak, MD;  Location: [facility] ORS;  Service: Gynecology;  Laterality: N/A;   IR EMBO TUMOR ORGAN ISCHEMIA INFARCT INC GUIDE ROADMAPPING      Her Family History Is Significant For: Family History  Problem Relation Age of Onset   Kidney disease Mother    Heart disease Mother    Stroke Mother    Hepatitis C Mother    Dementia Father    Blindness Father    Kidney disease Sister    Schizophrenia Brother    Hypertension Brother    Diabetes Brother    Schizophrenia Paternal Aunt    Suicidality Paternal Aunt    Stroke  Maternal Grandmother    Breast cancer Neg Hx     Her Social History Is Significant For: Social History   Socioeconomic History   Marital status: Divorced    Spouse name: Not on file   Number of children: 5   Years of education: Not on file   Highest education level: Associate degree: occupational, Scientist, product/process development, or vocational program  Occupational History   Not on file  Tobacco Use   Smoking status: Former    Current packs/day: 0.00    Average packs/day: 0.3 packs/day for 13.0 years (3.3 ttl pk-yrs)    Types: Cigars, Cigarettes    Start date: 05/04/2009    Quit date: 05/04/2022    Years since quitting: 1.2   Smokeless tobacco: Never   Tobacco comments:    Pt states 1 black and mild daily   Vaping Use   Vaping status: Never Used  Substance and Sexual Activity   Alcohol use: Yes    Alcohol/week: 12.0 standard drinks of alcohol    Types: 12 Shots of liquor per week    Comment: weekends   Drug use: No   Sexual activity: Not Currently    Birth control/protection: None  Other Topics Concern   Not on file  Social History Narrative   ** Merged History Encounter **       Social Drivers of Health   Financial Resource Strain: Low Risk  (04/21/2023)   Overall Financial Resource Strain (CARDIA)    Difficulty of Paying Living Expenses: Not hard at all  Food Insecurity: No Food Insecurity (04/21/2023)   Hunger Vital Sign    Worried About Running Out of Food in the Last Year: Never true    Ran Out of Food in the Last Year: Never true  Transportation Needs: No Transportation Needs (04/21/2023)   PRAPARE - Administrator, Civil Service (Medical): No    Lack of Transportation (Non-Medical): No  Physical Activity: Sufficiently Active (04/21/2023)   Exercise Vital Sign    Days of Exercise per Week: 3 days    Minutes of Exercise per Session: 100 min  Recent Concern: Physical Activity - Insufficiently Active (02/09/2023)   Exercise Vital Sign    Days of Exercise per Week: 2  days    Minutes of Exercise per Session:  30 min  Stress: Stress Concern Present (04/21/2023)   [employer] of Occupational Health - Occupational Stress Questionnaire    Feeling of Stress : Rather much  Social Connections: Socially Isolated (04/21/2023)   Social Connection and Isolation Panel [NHANES]    Frequency of Communication with Friends and Family: Three times a week    Frequency of Social Gatherings with Friends and Family: Never    Attends Religious Services: Never    Database administrator or Organizations: No    Attends Banker Meetings: Never    Marital Status: Divorced    Her Allergies Are:  Allergies  Allergen Reactions   Demerol [Meperidine] Anaphylaxis    Pt does not tolerate pain killers.   Oxycodone Anaphylaxis   Sulfa Antibiotics Anaphylaxis   Percocet [Oxycodone-Acetaminophen] Swelling  :   Her Current Medications Are:  Outpatient Encounter Medications as of 08/01/2023  Medication Sig   Acetaminophen (TYLENOL PO) Take by mouth as needed.   amLODipine (NORVASC) 5 MG tablet Take 1 tablet (5 mg total) by mouth daily.   cyanocobalamin (VITAMIN B12) 1000 MCG tablet Take 1 tablet (1,000 mcg total) by mouth daily.   escitalopram (LEXAPRO) 20 MG tablet Take 1 tablet (20 mg total) by mouth daily.   folic acid (FOLVITE) 1 MG tablet Take 1 tablet (1 mg total) by mouth daily.   hydrochlorothiazide (HYDRODIURIL) 25 MG tablet Take 1 tablet (25 mg total) by mouth daily.   metoprolol tartrate (LOPRESSOR) 25 MG tablet TAKE 1 TABLET BY MOUTH TWICE A DAY   Multiple Vitamin (MULTIVITAMIN WITH MINERALS) TABS tablet Take 1 tablet by mouth daily.   omeprazole (PRILOSEC) 20 MG capsule Take 1 capsule (20 mg total) by mouth daily as needed (acid reflux).   tiZANidine (ZANAFLEX) 4 MG tablet Take 1 tablet (4 mg total) by mouth every 6 (six) hours as needed for muscle spasms. (Patient taking differently: Take 4 mg by mouth as needed for muscle spasms.)   No  facility-administered encounter medications on file as of 08/01/2023.  :  Review of Systems:  Out of a complete 14 point review of systems, all are reviewed and negative with the exception of these symptoms as listed below:  Review of Systems  Neurological:        Patient In room #9 and alone. Patient states she has gotten better with the headaches and control her blood pressure. Patient states she not sleeping well due to waking up in the middle of the night.    Objective:  Neurological Exam  Physical Exam Physical Examination:   Vitals:   08/01/23 1021  BP: (!) 138/104  Pulse: 80    General Examination: The patient is a very pleasant 53 y.o. female in no acute distress. She appears well-developed and well-nourished and well groomed.   HEENT: Normocephalic, atraumatic, pupils are equal, round and reactive to light, no photophobia.  Extraocular tracking is good without limitation to gaze excursion or nystagmus noted. Hearing is grossly intact. Face is symmetric with normal facial animation. Speech is clear with no dysarthria noted. There is no hypophonia. There is no lip, neck/head, jaw or voice tremor. Neck is supple with full range of passive and active motion. There are no carotid bruits on auscultation. Oropharynx exam reveals: mild mouth dryness, adequate dental hygiene and moderate airway crowding. Tongue protrudes centrally and palate elevates symmetrically.    Chest: Clear to auscultation without wheezing, rhonchi or crackles noted.   Heart: S1+S2+0, regular and normal without murmurs, rubs  or gallops noted.    Abdomen: Soft, non-tender and non-distended.   Extremities: There is no pitting edema in the distal lower extremities bilaterally.    Skin: Warm and dry without trophic changes noted.    Musculoskeletal: exam reveals no obvious joint deformities.    Neurologically:  Mental status: The patient is awake, alert and oriented in all 4 spheres. Her immediate and remote  memory, attention, language skills and fund of knowledge are appropriate. There is no evidence of aphasia, agnosia, apraxia or anomia. Speech is clear with normal prosody and enunciation. Thought process is linear. Mood is normal and affect is normal.  Cranial nerves II - XII are as described above under HEENT exam.  Motor exam: Normal bulk, strength and tone is noted. There is no drift, no rebound, no postural or action tremor.  No resting tremor.   Fine motor skills and coordination: Normal finger taps, hand movements and rapid alternating patting in both upper extremities, normal foot taps bilaterally in the lower extremities.   Reflexes are 1-2+ bilaterally in the upper extremities and both knees.      Cerebellar testing: No dysmetria or intention tremor. There is no truncal or gait ataxia.   Sensory exam: intact to light touch in the upper and lower extremities.  Romberg is negative. Gait, station and balance: She stands easily. No veering to one side is noted. No leaning to one side is noted. Posture is age-appropriate and stance is narrow based. Gait shows normal stride length and normal pace. No problems turning are noted.  Tandem walk unremarkable.   Assessment and Plan:    In summary, Cindy Brewer is a very pleasant 53 year old female with an underlying medical history of OSA, palpitations, anemia, arthritis, breast cancer with status post lumpectomy, reflux disease, depression, anxiety, and borderline overweight state, who presents for follow-up consultation of her recurrent headaches.  She reports that her headaches have improved with better blood pressure management.  She has been taking metoprolol 50 mg twice daily but does appear to have 2 prescriptions for hydrochlorothiazide, 1 from her PCP and 1 from her cardiologist.  She is advised to make sure that her PCP and cardiologist are aware of these prescriptions and that she does not duplicate anything accidentally.  I do believe  that her headaches are likely a mixed type of headache.  We talked about this again today.  Below is a summary of my recommendations and discussion points with the patient.  The patient was given these instructions verbally and in her MyChart after visit summary which she can access electronically:   << 1. Please as your eye physician to send records from you visit since October 2024. I want to make sure, that you have had a full, dilated eye exam, including visual field testing, and to see if you have papilledema (swelling of the eye nerves - one or both).  2. We may consider a LP (lumbar puncture/spinal tap) with fluid pressure testing and routine fluid testing in the future, if needed.  3.  Your neurological exam is good today.   4. Please get back on your CPAP consistently as you are not sleeping well and your BP has been up. Untreated OSA can cause or worsen headaches. If needed, please make an appointment with your sleep specialist.  5. Keep your appointment with cardiology; they may make additional recommendations for BP management. Elevated BP can be part of the cause of your headaches. Please make sure your PCP is aware  of all medication changes.   6. Limit your caffeine intake to up to 2 servings/day.  7. Limit alcohol to less than 1 serving/day.  8.  Please work on complete smoking cessation including black and milds. 9. Follow up in this clinic in 6-8 months to see the nurse practitioner. >>   I answered all her questions today and she was in agreement with our plan.  I spent 40 minutes in total face-to-face time and in reviewing records during pre-charting, more than 50% of which was spent in counseling and coordination of care, reviewing test results, reviewing medications and treatment regimen and/or in discussing or reviewing the diagnosis of recurrent headaches, OSA, elevated blood pressure, the prognosis and treatment options. Pertinent laboratory and imaging test results that were  available during this visit with the patient were reviewed by me and considered in my medical decision making (see chart for details).

## 2023-08-01 NOTE — Patient Instructions (Signed)
I am glad to hear, your headaches are better.   1. Please as your eye physician to send records from you visit since October 2024. I want to make sure, that you have had a full, dilated eye exam, including visual field testing, and to see if you have papilledema (swelling of the eye nerves - one or both).  2. We may consider a LP (lumbar puncture/spinal tap) with fluid pressure testing and routine fluid testing in the future, if needed.  3.  Your neurological exam is good today.   4. Please get back on your CPAP consistently as you are not sleeping well and your BP has been up. Untreated OSA can cause or worsen headaches. If needed, please make an appointment with your sleep specialist.  5. Keep your appointment with cardiology; they may make additional recommendations for BP management. Elevated BP can be part of the cause of your headaches. Please make sure your PCP is aware of all medication changes.   6. Limit your caffeine intake to up to 2 servings/day.  7. Limit alcohol to less than 1 serving/day.  8.  Please work on complete smoking cessation including black and milds. 9. Follow up in this clinic in 6-8 months to see the nurse practitioner.

## 2023-08-23 ENCOUNTER — Encounter (INDEPENDENT_AMBULATORY_CARE_PROVIDER_SITE_OTHER): Payer: Medicare HMO

## 2023-08-25 ENCOUNTER — Inpatient Hospital Stay: Payer: Medicare HMO | Attending: Oncology

## 2023-08-25 DIAGNOSIS — Z171 Estrogen receptor negative status [ER-]: Secondary | ICD-10-CM

## 2023-08-25 DIAGNOSIS — E538 Deficiency of other specified B group vitamins: Secondary | ICD-10-CM | POA: Insufficient documentation

## 2023-08-25 MED ORDER — CYANOCOBALAMIN 1000 MCG/ML IJ SOLN
1000.0000 ug | Freq: Once | INTRAMUSCULAR | Status: AC
Start: 2023-08-25 — End: 2023-08-25
  Administered 2023-08-25: 1000 ug via INTRAMUSCULAR
  Filled 2023-08-25: qty 1

## 2023-08-28 ENCOUNTER — Encounter: Payer: Self-pay | Admitting: Oncology

## 2023-08-29 ENCOUNTER — Ambulatory Visit (INDEPENDENT_AMBULATORY_CARE_PROVIDER_SITE_OTHER): Payer: Medicare HMO | Admitting: Internal Medicine

## 2023-08-29 ENCOUNTER — Other Ambulatory Visit: Payer: Self-pay

## 2023-08-29 VITALS — BP 144/76 | HR 98 | Temp 98.1°F | Resp 16 | Ht 66.0 in | Wt 154.1 lb

## 2023-08-29 DIAGNOSIS — S01511A Laceration without foreign body of lip, initial encounter: Secondary | ICD-10-CM

## 2023-08-29 MED ORDER — DOXYCYCLINE HYCLATE 100 MG PO TABS
100.0000 mg | ORAL_TABLET | Freq: Two times a day (BID) | ORAL | 0 refills | Status: AC
Start: 2023-08-29 — End: 2023-09-05

## 2023-08-29 NOTE — Progress Notes (Signed)
   Acute Office Visit  Subjective:     Patient ID: Cindy Brewer, female    DOB: [birth year], 53 y.o.   MRN: [id number]  Chief Complaint  Patient presents with   Fall    1 week ago, busted lip that want heal    Fall   Patient is in today for laceration of central bottom lip that occurred about 1 week ago after a fall. Patient states she was alone at home and was undressing when she was leaning forward and fell face first into her hard wood floors. She was unconscious for "hours" and came to in a pool of blood. She had a large laceration in her lip but no other bleeding, pain or swelling. She did have teeth sensitivity for a days afterwards but denies dental pain or loose teeth. 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 She has noticed that the laceration is slowly healing but draining a clear/white discharge that has a foul odor.   Review of Systems  All other systems reviewed and are negative.       Objective:    BP (!) 144/76 (Cuff Size: Large)   Pulse 98   Temp 98.1 F (36.7 C) (Oral)   Resp 16   Ht 5\' 6"  (1.676 m)   Wt 154 lb 1.6 oz (69.9 kg)   LMP 03/27/2020   SpO2 99%   BMI 24.87 kg/m  BP Readings from Last 3 Encounters:  08/29/23 (!) 144/76  08/01/23 (!) 160/92  07/31/23 (!) 170/114   Wt Readings from Last 3 Encounters:  08/29/23 154 lb 1.6 oz (69.9 kg)  08/01/23 155 lb (70.3 kg)  06/09/23 151 lb 9.6 oz (68.8 kg)      Physical Exam Constitutional:      Appearance: Normal appearance.  HENT:     Head: Normocephalic and atraumatic.     Jaw: There is normal jaw occlusion. No tenderness or swelling.     Nose: Nose normal.     Mouth/Throat:     Mouth: Mucous membranes are moist. Lacerations present.     Dentition: No dental tenderness or gum lesions.     Tongue: No lesions.     Pharynx: Oropharynx is clear.      Comments: Central bottom lip laceration of the dry vermilion with erythema and clear drainage Cardiovascular:     Rate  and Rhythm: Normal rate and regular rhythm.  Pulmonary:     Effort: Pulmonary effort is normal.     Breath sounds: Normal breath sounds.  Neurological:     General: No focal deficit present.     Mental Status: She is alert. Mental status is at baseline.  Psychiatric:        Mood and Affect: Mood normal.        Behavior: Behavior normal.     No results found for any visits on 08/29/23.      Assessment & Plan:   1. Lip laceration, initial encounter (Primary): Laceration healing, will place referral to maxillofacial surgeon if it fails to heal fully. Will prescribe antibiotics today as well. She will follow up here in 1 month to recheck and to discuss thyroid labs.   - Ambulatory referral to Oral Maxillofacial Surgery - doxycycline (VIBRA-TABS) 100 MG tablet; Take 1 tablet (100 mg total) by mouth 2 (two) times daily for 7 days.  Dispense: 14 tablet; Refill: 0   Return in about 4 weeks (around 09/26/2023).  Margarita Mail, DO

## 2023-09-15 ENCOUNTER — Encounter: Payer: Self-pay | Admitting: Oncology

## 2023-09-22 ENCOUNTER — Inpatient Hospital Stay: Payer: Self-pay | Attending: Oncology

## 2023-09-22 DIAGNOSIS — Z79899 Other long term (current) drug therapy: Secondary | ICD-10-CM | POA: Diagnosis not present

## 2023-09-22 DIAGNOSIS — E538 Deficiency of other specified B group vitamins: Secondary | ICD-10-CM | POA: Diagnosis present

## 2023-09-22 DIAGNOSIS — Z171 Estrogen receptor negative status [ER-]: Secondary | ICD-10-CM

## 2023-09-22 MED ORDER — CYANOCOBALAMIN 1000 MCG/ML IJ SOLN
1000.0000 ug | Freq: Once | INTRAMUSCULAR | Status: AC
Start: 2023-09-22 — End: 2023-09-22
  Administered 2023-09-22: 1000 ug via INTRAMUSCULAR
  Filled 2023-09-22: qty 1

## 2023-09-26 ENCOUNTER — Encounter: Payer: Self-pay | Admitting: Internal Medicine

## 2023-09-26 ENCOUNTER — Other Ambulatory Visit: Payer: Self-pay

## 2023-09-26 ENCOUNTER — Ambulatory Visit (INDEPENDENT_AMBULATORY_CARE_PROVIDER_SITE_OTHER): Payer: Medicare HMO | Admitting: Internal Medicine

## 2023-09-26 VITALS — BP 130/80 | HR 94 | Temp 98.1°F | Resp 16 | Ht 66.0 in | Wt 142.5 lb

## 2023-09-26 DIAGNOSIS — R7989 Other specified abnormal findings of blood chemistry: Secondary | ICD-10-CM

## 2023-09-26 DIAGNOSIS — E559 Vitamin D deficiency, unspecified: Secondary | ICD-10-CM

## 2023-09-26 DIAGNOSIS — I10 Essential (primary) hypertension: Secondary | ICD-10-CM

## 2023-09-26 DIAGNOSIS — Z13220 Encounter for screening for lipoid disorders: Secondary | ICD-10-CM

## 2023-09-26 DIAGNOSIS — D509 Iron deficiency anemia, unspecified: Secondary | ICD-10-CM | POA: Diagnosis not present

## 2023-09-26 DIAGNOSIS — F331 Major depressive disorder, recurrent, moderate: Secondary | ICD-10-CM

## 2023-09-26 DIAGNOSIS — Z1211 Encounter for screening for malignant neoplasm of colon: Secondary | ICD-10-CM

## 2023-09-26 MED ORDER — ESCITALOPRAM OXALATE 20 MG PO TABS: 20.0000 mg | ORAL_TABLET | Freq: Every day | ORAL | 0 refills | Status: DC

## 2023-09-26 MED ORDER — HYDROCHLOROTHIAZIDE 25 MG PO TABS: 25.0000 mg | ORAL_TABLET | Freq: Every day | ORAL | 1 refills | Status: DC

## 2023-09-26 MED ORDER — HYDROCHLOROTHIAZIDE 25 MG PO TABS
25.0000 mg | ORAL_TABLET | Freq: Every day | ORAL | 1 refills | Status: DC
Start: 2023-09-26 — End: 2023-09-26

## 2023-09-26 MED ORDER — AMLODIPINE BESYLATE 5 MG PO TABS: 5.0000 mg | ORAL_TABLET | Freq: Every day | ORAL | 1 refills | Status: DC

## 2023-09-26 MED ORDER — METOPROLOL TARTRATE 25 MG PO TABS: 25.0000 mg | ORAL_TABLET | Freq: Two times a day (BID) | ORAL | 1 refills | Status: DC

## 2023-09-26 NOTE — Assessment & Plan Note (Signed)
 Blood pressure well-controlled, no changes made to medications and refills sent to pharmacy.  Labs due.

## 2023-09-26 NOTE — Patient Instructions (Addendum)
 It was great seeing you today!  Plan discussed at today's visit: -Blood work ordered today, results will be uploaded to MyChart.  -Medications refilled -Cologuard ordered  Follow up in: 6 months with Pap  Take care and let us know if you have any questions or concerns prior to your next visit.  Dr. Caralee Ates  Recombinant Zoster (Shingles) Vaccine: What You Need to Know Many vaccine information statements are available in Spanish and other languages. See PromoAge.com.br. 1. Why get vaccinated? Recombinant zoster (shingles) vaccine can prevent shingles. Shingles (also called herpes zoster, or just zoster) is a painful skin rash, usually with blisters. In addition to the rash, shingles can cause fever, headache, chills, or upset stomach. Rarely, shingles can lead to complications such as pneumonia, hearing problems, blindness, brain inflammation (encephalitis), or death. The risk of shingles increases with age. The most common complication of shingles is long-term nerve pain called postherpetic neuralgia (PHN). PHN occurs in the areas where the shingles rash was and can last for months or years after the rash goes away. The pain from PHN can be severe and debilitating. The risk of PHN increases with age. An older adult with shingles is more likely to develop PHN and have longer lasting and more severe pain than a younger person. People with weakened immune systems also have a higher risk of getting shingles and complications from the disease. Shingles is caused by varicella-zoster virus, the same virus that causes chickenpox. After you have chickenpox, the virus stays in your body and can cause shingles later in life. Shingles cannot be passed from one person to another, but the virus that causes shingles can spread and cause chickenpox in someone who has never had chickenpox or has never received chickenpox vaccine. 2. Recombinant shingles vaccine Recombinant shingles vaccine provides strong  protection against shingles. By preventing shingles, recombinant shingles vaccine also protects against PHN and other complications. Recombinant shingles vaccine is recommended for: Adults 50 years and older Adults 19 years and older who have a weakened immune system because of disease or treatments Shingles vaccine is given as a two-dose series. For most people, the second dose should be given 2 to 6 months after the first dose. Some people who have or will have a weakened immune system can get the second dose 1 to 2 months after the first dose. Ask your health care provider for guidance. People who have had shingles in the past and people who have received varicella (chickenpox) vaccine are recommended to get recombinant shingles vaccine. The vaccine is also recommended for people who have already gotten another type of shingles vaccine, the live shingles vaccine. There is no live virus in recombinant shingles vaccine. Shingles vaccine may be given at the same time as other vaccines. 3. Talk with your health care provider Tell your vaccination provider if the person getting the vaccine: Has had an allergic reaction after a previous dose of recombinant shingles vaccine, or has any severe, life-threatening allergies Is currently experiencing an episode of shingles Is pregnant In some cases, your health care provider may decide to postpone shingles vaccination until a future visit. People with minor illnesses, such as a cold, may be vaccinated. People who are moderately or severely ill should usually wait until they recover before getting recombinant shingles vaccine. Your health care provider can give you more information. 4. Risks of a vaccine reaction A sore arm with mild or moderate pain is very common after recombinant shingles vaccine. Redness and swelling can also happen  at the site of the injection. Tiredness, muscle pain, headache, shivering, fever, stomach pain, and nausea are common after  recombinant shingles vaccine. These side effects may temporarily prevent a vaccinated person from doing regular activities. Symptoms usually go away on their own in 2 to 3 days. You should still get the second dose of recombinant shingles vaccine even if you had one of these reactions after the first dose. Guillain-Barr syndrome (GBS), a serious nervous system disorder, has been reported very rarely after recombinant zoster vaccine. People sometimes faint after medical procedures, including vaccination. Tell your provider if you feel dizzy or have vision changes or ringing in the ears. As with any medicine, there is a very remote chance of a vaccine causing a severe allergic reaction, other serious injury, or death. 5. What if there is a serious problem? An allergic reaction could occur after the vaccinated person leaves the clinic. If you see signs of a severe allergic reaction (hives, swelling of the face and throat, difficulty breathing, a fast heartbeat, dizziness, or weakness), call 9-1-1 and get the person to the nearest hospital. For other signs that concern you, call your health care provider. Adverse reactions should be reported to the Vaccine Adverse Event Reporting System (VAERS). Your health care provider will usually file this report, or you can do it yourself. Visit the VAERS website at www.vaers.LAgents.no or call 757-716-5772. VAERS is only for reporting reactions, and VAERS staff members do not give medical advice. 6. How can I learn more? Ask your health care provider. Call your local or state health department. Visit the website of the Food and Drug Administration (FDA) for vaccine package inserts and additional information at GoldCloset.com.ee. Contact the Centers for Disease Control and Prevention (CDC): Call 431-672-8341 (1-800-CDC-INFO) or Visit CDC's website at PicCapture.uy. Source: CDC Vaccine Information Statement Recombinant Zoster  Vaccine (08/07/2020) This same material is available at FootballExhibition.com.br for no charge. This information is not intended to replace advice given to you by your health care provider. Make sure you discuss any questions you have with your health care provider. Document Revised: 10/05/2022 Document Reviewed: 07/06/2022 Elsevier Patient Education  2024 [employer].

## 2023-09-26 NOTE — Progress Notes (Signed)
 Established Patient Office Visit  Subjective    Patient ID: Cindy Brewer, female    DOB: 1971-03-29  Age: 53 y.o. MRN: [id number]  CC:  Chief Complaint  Patient presents with   Follow-up    4 week recheck    HPI Cindy Brewer presents to follow up.   Abnormal Thyroid Labs: -Last 01/04/23 with Tsh 0.34, T3 36, T4 6.9  Breast Cancer, multifocal triple negative Stage IIB: -Following with Oncology, Dr. Cathie Hoops. Note and labs reviewed from 07/31/23 .  -S/p left lumpectomy and sentinel lymph node biopsy in 2019 -Patient opted out of chemotherapy -Planning for breast MRI for screening  History of Iron Deficiency Anemia secondary to uterine fibroids: -Received iron transfusions in the past but since she had polypectomy and myomectomy her hemoglobin has been stable, however MCV elevated on last set of labs.  -Started on Vitamin B12 and folate supplements   Hypertension: -Medications: Amlodipine 5 mg, HCTZ 25 mg, Metoprolol 25 mg BID  -Patient has been compliant with Metoprolol but has not been taking the others -Failed Meds: Losartan makes her heart race  -Checking BP at home (average): not checking currently but does have a cuff -Denies any SOB, CP, vision changes, LE edema or symptoms of hypotension.   MDD: -Mood status: stable -Current treatment: Lexapro 20 mg, needing refills     04/21/2023    8:16 AM 04/21/2023    8:11 AM 04/17/2023    8:59 AM 02/09/2023    9:44 AM 02/02/2023    1:11 PM  Depression screen PHQ 2/9  Decreased Interest 3 3 3 2 2   Down, Depressed, Hopeless 3 3 3  2   PHQ - 2 Score 6 6 6 2 4   Altered sleeping 1  3 1 3   Tired, decreased energy 3  3 3 3   Change in appetite 0  3  3  Feeling bad or failure about yourself  0  0 1 1  Trouble concentrating 1  3 2 2   Moving slowly or fidgety/restless 0  0 0 0  Suicidal thoughts 0  0 0 1  PHQ-9 Score 11  18 9 17   Difficult doing work/chores Somewhat difficult  Very difficult Somewhat difficult Somewhat  difficult   Health Maintenance: -Blood work due -Colon cancer screening due  -Shingles vaccine due  Outpatient Encounter Medications as of 09/26/2023  Medication Sig   Acetaminophen (TYLENOL PO) Take by mouth as needed.   amLODipine (NORVASC) 5 MG tablet Take 1 tablet (5 mg total) by mouth daily.   cyanocobalamin (VITAMIN B12) 1000 MCG tablet Take 1 tablet (1,000 mcg total) by mouth daily.   folic acid (FOLVITE) 1 MG tablet Take 1 tablet (1 mg total) by mouth daily.   hydrochlorothiazide (HYDRODIURIL) 25 MG tablet Take 1 tablet (25 mg total) by mouth daily.   metoprolol tartrate (LOPRESSOR) 25 MG tablet TAKE 1 TABLET BY MOUTH TWICE A DAY   Multiple Vitamin (MULTIVITAMIN WITH MINERALS) TABS tablet Take 1 tablet by mouth daily.   omeprazole (PRILOSEC) 20 MG capsule Take 1 capsule (20 mg total) by mouth daily as needed (acid reflux).   tiZANidine (ZANAFLEX) 4 MG tablet Take 1 tablet (4 mg total) by mouth every 6 (six) hours as needed for muscle spasms. (Patient taking differently: Take 4 mg by mouth as needed for muscle spasms.)   escitalopram (LEXAPRO) 20 MG tablet Take 1 tablet (20 mg total) by mouth daily.   No facility-administered encounter medications on file as of 09/26/2023.  Past Medical History:  Diagnosis Date   Anemia    Anxiety    Arthritis    right knee    Cancer (HCC) 2019   BREAST- Left   Depression    Fibroid    GERD (gastroesophageal reflux disease)     Past Surgical History:  Procedure Laterality Date   BREAST BIOPSY Left 2019   [facility]   BREAST LUMPECTOMY WITH AXILLARY LYMPH NODE BIOPSY Left 11/24/2017   Procedure: LEFT BREAST LUMPECTOMY WITH LEFT SENTINEL NODE LYMPH NODE BIOPSY;  Surgeon: Griselda Miner, MD;  Location: [facility] ORS;  Service: General;  Laterality: Left;   CESAREAN SECTION     EYE SURGERY     DETACHED RETINA 4/519   HYSTEROSCOPY WITH D & C N/A 03/14/2019   Procedure: DILATATION AND CURETTAGE Matt Holmes;  Surgeon: Conard Novak, MD;  Location: [facility] ORS;  Service: Gynecology;  Laterality: N/A;   IR EMBO TUMOR ORGAN ISCHEMIA INFARCT INC GUIDE ROADMAPPING      Family History  Problem Relation Age of Onset   Kidney disease Mother    Heart disease Mother    Stroke Mother    Hepatitis C Mother    Dementia Father    Blindness Father    Kidney disease Sister    Schizophrenia Brother    Hypertension Brother    Diabetes Brother    Schizophrenia Paternal Aunt    Suicidality Paternal Aunt    Stroke Maternal Grandmother    Breast cancer Neg Hx     Social History   Socioeconomic History   Marital status: Divorced    Spouse name: Not on file   Number of children: 5   Years of education: Not on file   Highest education level: Some college, no degree  Occupational History   Not on file  Tobacco Use   Smoking status: Former    Current packs/day: 0.00    Average packs/day: 0.3 packs/day for 13.0 years (3.3 ttl pk-yrs)    Types: Cigars, Cigarettes    Start date: 05/04/2009    Quit date: 05/04/2022    Years since quitting: 1.3   Smokeless tobacco: Never   Tobacco comments:    Pt states 1 black and mild daily   Vaping Use   Vaping status: Never Used  Substance and Sexual Activity   Alcohol use: Yes    Alcohol/week: 12.0 standard drinks of alcohol    Types: 12 Shots of liquor per week    Comment: weekends   Drug use: No   Sexual activity: Not Currently    Birth control/protection: None  Other Topics Concern   Not on file  Social History Narrative   ** Merged History Encounter **       Social Drivers of Health   Financial Resource Strain: Low Risk  (08/29/2023)   Overall Financial Resource Strain (CARDIA)    Difficulty of Paying Living Expenses: Not hard at all  Food Insecurity: Food Insecurity Present (08/29/2023)   Hunger Vital Sign    Worried About Running Out of Food in the Last Year: Sometimes true    Ran Out of Food in the Last Year: Patient declined  Transportation Needs: No Transportation  Needs (08/29/2023)   PRAPARE - Administrator, Civil Service (Medical): No    Lack of Transportation (Non-Medical): No  Physical Activity: Sufficiently Active (08/29/2023)   Exercise Vital Sign    Days of Exercise per Week: 4 days    Minutes of Exercise per  Session: 70 min  Stress: Stress Concern Present (08/29/2023)   [employer] of Occupational Health - Occupational Stress Questionnaire    Feeling of Stress : To some extent  Social Connections: Socially Isolated (08/29/2023)   Social Connection and Isolation Panel [NHANES]    Frequency of Communication with Friends and Family: Once a week    Frequency of Social Gatherings with Friends and Family: Never    Attends Religious Services: Never    Database administrator or Organizations: No    Attends Banker Meetings: Never    Marital Status: Divorced  Catering manager Violence: Not At Risk (02/09/2023)   Humiliation, Afraid, Rape, and Kick questionnaire    Fear of Current or Ex-Partner: No    Emotionally Abused: No    Physically Abused: No    Sexually Abused: No    Review of Systems  All other systems reviewed and are negative.     Objective    BP 130/80 (Cuff Size: Large)   Pulse 94   Temp 98.1 F (36.7 C) (Oral)   Resp 16   Ht 5\' 6"  (1.676 m)   Wt 142 lb 8 oz (64.6 kg)   LMP 03/27/2020   SpO2 96%   BMI 23.00 kg/m   Physical Exam Constitutional:      Appearance: Normal appearance.  HENT:     Head: Normocephalic and atraumatic.     Mouth/Throat:     Mouth: Mucous membranes are moist.     Pharynx: Oropharynx is clear.     Comments: Lip healing Eyes:     Conjunctiva/sclera: Conjunctivae normal.  Cardiovascular:     Rate and Rhythm: Normal rate and regular rhythm.  Pulmonary:     Effort: Pulmonary effort is normal.     Breath sounds: Normal breath sounds.  Skin:    General: Skin is warm and dry.  Neurological:     General: No focal deficit present.     Mental Status: She is alert.  Mental status is at baseline.  Psychiatric:        Mood and Affect: Mood normal.        Behavior: Behavior normal.       Assessment & Plan:   Primary hypertension Assessment & Plan: Blood pressure well-controlled, no changes made to medications and refills sent to pharmacy.  Labs due.  Orders: -     CBC with Differential/Platelet -     amLODIPine Besylate; Take 1 tablet (5 mg total) by mouth daily.  Dispense: 90 tablet; Refill: 1 -     Metoprolol Tartrate; Take 1 tablet (25 mg total) by mouth 2 (two) times daily.  Dispense: 180 tablet; Refill: 1 -     hydroCHLOROthiazide; Take 1 tablet (25 mg total) by mouth daily.  Dispense: 90 tablet; Refill: 1  Iron deficiency anemia, unspecified iron deficiency anemia type Assessment & Plan: Recheck CBC, she does follow with hematology for vitamin B12 injections.  Orders: -     CBC with Differential/Platelet  Moderate episode of recurrent major depressive disorder (HCC) Assessment & Plan: Stable, refill Lexapro 20 mg.  Orders: -     Escitalopram Oxalate; Take 1 tablet (20 mg total) by mouth daily.  Dispense: 90 tablet; Refill: 0  Low TSH level Assessment & Plan: Recheck thyroid panel and thyroid receptor antibody today.  Orders: -     Thyroid Panel With TSH -     TRAb (TSH Receptor Binding Antibody)  Lipid screening -     Lipid  panel  Vitamin D deficiency -     VITAMIN D 25 Hydroxy (Vit-D Deficiency, Fractures)  Colon cancer screening -     Cologuard   Screening labs ordered as well as Cologuard.  Discussed shingles vaccine information printed for the patient to get at the pharmacy.   Return in about 6 months (around 03/28/2024) for follow up and Pap (can schedule separately if she wants).   Margarita Mail, DO

## 2023-09-26 NOTE — Assessment & Plan Note (Signed)
 Recheck CBC, she does follow with hematology for vitamin B12 injections.

## 2023-09-26 NOTE — Assessment & Plan Note (Signed)
 Stable, refill Lexapro 20 mg.

## 2023-09-26 NOTE — Assessment & Plan Note (Signed)
 Recheck thyroid panel and thyroid receptor antibody today.

## 2023-09-28 ENCOUNTER — Encounter: Payer: Self-pay | Admitting: Internal Medicine

## 2023-09-28 LAB — CBC WITH DIFFERENTIAL/PLATELET
Absolute Lymphocytes: 1784 {cells}/uL (ref 850–3900)
Absolute Monocytes: 530 {cells}/uL (ref 200–950)
Basophils Absolute: 29 {cells}/uL (ref 0–200)
Basophils Relative: 0.5 %
Eosinophils Absolute: 91 {cells}/uL (ref 15–500)
Eosinophils Relative: 1.6 %
HCT: 41.4 % (ref 35.0–45.0)
Hemoglobin: 13.9 g/dL (ref 11.7–15.5)
MCH: 35.5 pg — ABNORMAL HIGH (ref 27.0–33.0)
MCHC: 33.6 g/dL (ref 32.0–36.0)
MCV: 105.9 fL — ABNORMAL HIGH (ref 80.0–100.0)
MPV: 10.7 fL (ref 7.5–12.5)
Monocytes Relative: 9.3 %
Neutro Abs: 3266 {cells}/uL (ref 1500–7800)
Neutrophils Relative %: 57.3 %
Platelets: 220 10*3/uL (ref 140–400)
RBC: 3.91 10*6/uL (ref 3.80–5.10)
RDW: 12 % (ref 11.0–15.0)
Total Lymphocyte: 31.3 %
WBC: 5.7 10*3/uL (ref 3.8–10.8)

## 2023-09-28 LAB — LIPID PANEL
Cholesterol: 197 mg/dL (ref <200)
HDL: 38 mg/dL — ABNORMAL LOW (ref >=50)
LDL Cholesterol (Calc): 127 mg/dL — ABNORMAL HIGH
Non-HDL Cholesterol (Calc): 159 mg/dL — ABNORMAL HIGH (ref <130)
Total CHOL/HDL Ratio: 5.2 (calc) — ABNORMAL HIGH (ref <5.0)
Triglycerides: 203 mg/dL — ABNORMAL HIGH (ref <150)

## 2023-09-28 LAB — THYROID PANEL WITH TSH
Free Thyroxine Index: 2 (ref 1.4–3.8)
T3 Uptake: 34 % (ref 22–35)
T4, Total: 6 ug/dL (ref 5.1–11.9)
TSH: 0.5 m[IU]/L

## 2023-09-28 LAB — VITAMIN D 25 HYDROXY (VIT D DEFICIENCY, FRACTURES): Vit D, 25-Hydroxy: 26 ng/mL — ABNORMAL LOW (ref 30–100)

## 2023-09-28 LAB — TRAB (TSH RECEPTOR BINDING ANTIBODY): TRAB: <1 IU/L (ref <=2.00)

## 2023-09-28 MED ORDER — VITAMIN D (ERGOCALCIFEROL) 1.25 MG (50000 UNIT) PO CAPS: 50000.0000 [IU] | ORAL_CAPSULE | ORAL | 0 refills | Status: AC

## 2023-09-28 NOTE — Addendum Note (Signed)
 Addended by: Margarita Mail on: 09/28/2023 12:43 PM   Modules accepted: Orders

## 2023-10-10 ENCOUNTER — Ambulatory Visit: Payer: Medicare HMO | Admitting: Plastic Surgery

## 2023-10-13 ENCOUNTER — Ambulatory Visit: Payer: Medicare HMO | Admitting: Plastic Surgery

## 2023-10-16 LAB — COLOGUARD: COLOGUARD: NEGATIVE

## 2023-10-17 ENCOUNTER — Encounter: Payer: Self-pay | Admitting: Internal Medicine

## 2023-10-27 ENCOUNTER — Encounter: Payer: Self-pay | Admitting: Oncology

## 2023-10-27 ENCOUNTER — Inpatient Hospital Stay: Payer: Self-pay | Attending: Oncology

## 2023-10-27 DIAGNOSIS — Z79899 Other long term (current) drug therapy: Secondary | ICD-10-CM | POA: Insufficient documentation

## 2023-10-27 DIAGNOSIS — E538 Deficiency of other specified B group vitamins: Secondary | ICD-10-CM | POA: Diagnosis present

## 2023-10-27 DIAGNOSIS — Z171 Estrogen receptor negative status [ER-]: Secondary | ICD-10-CM

## 2023-10-27 MED ORDER — CYANOCOBALAMIN 1000 MCG/ML IJ SOLN
1000.0000 ug | Freq: Once | INTRAMUSCULAR | Status: AC
Start: 2023-10-27 — End: 2023-10-27
  Administered 2023-10-27: 1000 ug via INTRAMUSCULAR
  Filled 2023-10-27: qty 1

## 2023-11-15 ENCOUNTER — Other Ambulatory Visit: Payer: Self-pay

## 2023-11-15 ENCOUNTER — Encounter: Payer: Self-pay | Admitting: Internal Medicine

## 2023-11-15 ENCOUNTER — Ambulatory Visit: Admitting: Internal Medicine

## 2023-11-15 ENCOUNTER — Encounter: Payer: Self-pay | Admitting: Oncology

## 2023-11-15 VITALS — BP 130/82 | HR 87 | Temp 98.2°F | Resp 16 | Ht 66.0 in | Wt 145.4 lb

## 2023-11-15 DIAGNOSIS — R197 Diarrhea, unspecified: Secondary | ICD-10-CM

## 2023-11-15 DIAGNOSIS — R232 Flushing: Secondary | ICD-10-CM

## 2023-11-15 DIAGNOSIS — G4709 Other insomnia: Secondary | ICD-10-CM

## 2023-11-15 DIAGNOSIS — K219 Gastro-esophageal reflux disease without esophagitis: Secondary | ICD-10-CM | POA: Diagnosis not present

## 2023-11-15 NOTE — Progress Notes (Signed)
 Acute Office Visit  Subjective:     Patient ID: Cindy Brewer, female    DOB: [birth year], 53 y.o.   MRN: [id number]  Chief Complaint  Patient presents with   Abdominal Pain    For 1 week   Gastroesophageal Reflux    Abdominal Pain Associated symptoms include diarrhea. Pertinent negatives include no constipation, nausea or vomiting. Her past medical history is significant for GERD.  Gastroesophageal Reflux She complains of abdominal pain. She reports no nausea.   Patient is in today for abdominal bloating and diarrhea x 1 week.   Discussed the use of AI scribe software for clinical note transcription with the patient, who gave verbal consent to proceed.  History of Present Illness Cindy Brewer is a 53 year old female who presents with abdominal pain and diarrhea.  She has experienced abdominal pain and diarrhea for the past week. Diarrhea occurs immediately after eating, with a lighter brown color and looser texture than usual. She typically has bowel movements twice a day, but the consistency has changed. There is no bleeding, severe pain, or vomiting. She has increased her intake of foods like chicken wings, which may contribute to her symptoms.  She has acid reflux and takes Prilosec effectively. She avoids tomatoes but finds spaghetti sauce more triggering due to garlic content.  She experiences insomnia due to hot flashes and discomfort from her CPAP machine. She has been cancer-free for six years and follows a high-protein, low-carb diet, avoiding foods that cause bloating.    Review of Systems  Gastrointestinal:  Positive for abdominal pain and diarrhea. Negative for blood in stool, constipation, nausea and vomiting.  Psychiatric/Behavioral:  The patient has insomnia.         Objective:    BP 130/82 (Cuff Size: Normal)   Pulse 87   Temp 98.2 F (36.8 C) (Oral)   Resp 16   Ht 5\' 6"  (1.676 m)   Wt 145 lb 6.4 oz (66 kg)   LMP 03/27/2020   SpO2  100%   BMI 23.47 kg/m  BP Readings from Last 3 Encounters:  11/15/23 130/82  09/26/23 130/80  08/29/23 (!) 144/76   Wt Readings from Last 3 Encounters:  11/15/23 145 lb 6.4 oz (66 kg)  09/26/23 142 lb 8 oz (64.6 kg)  08/29/23 154 lb 1.6 oz (69.9 kg)      Physical Exam Constitutional:      Appearance: Normal appearance.  HENT:     Head: Normocephalic and atraumatic.  Eyes:     Conjunctiva/sclera: Conjunctivae normal.  Cardiovascular:     Rate and Rhythm: Normal rate and regular rhythm.  Pulmonary:     Effort: Pulmonary effort is normal.     Breath sounds: Normal breath sounds.  Skin:    General: Skin is warm and dry.  Neurological:     General: No focal deficit present.     Mental Status: She is alert. Mental status is at baseline.  Psychiatric:        Mood and Affect: Mood normal.        Behavior: Behavior normal.     No results found for any visits on 11/15/23.      Assessment & Plan:   Assessment & Plan Diarrhea Acute diarrhea likely due to dietary changes. Cologuard negative, thyroid  issues resolved. - Advise bland diet, avoid fats and spicy foods. - Recommend home-cooked meals. - Suggest probiotics, specifically Align. - Consider fiber supplements.  Gastroesophageal reflux disease (GERD) GERD managed with Prilosec, no  new symptoms.  Sleep apnea CPAP mask causes discomfort, leading to removal during sleep. - Discuss nasal pillows with sleep specialist.  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 Lexapro  effective for mental health. - Continue Lexapro  regimen. - Address hot flashes and CPAP mask issues.  Hot flashes Hot flashes disrupt sleep, hormone therapy not advised due to breast cancer history. - Try black cohosh for one month. - Consider gynecology referral if symptoms persist.  Breast cancer Six years cancer-free. Hormone therapy not advised due to recurrence risk.   Return in about 6 months (around  05/17/2024).  Rockney Cid, DO

## 2023-11-21 ENCOUNTER — Encounter (INDEPENDENT_AMBULATORY_CARE_PROVIDER_SITE_OTHER): Payer: Self-pay

## 2023-12-11 ENCOUNTER — Ambulatory Visit: Admission: RE | Admit: 2023-12-11 | Payer: Medicare HMO | Source: Ambulatory Visit

## 2023-12-11 ENCOUNTER — Ambulatory Visit
Admission: RE | Admit: 2023-12-11 | Discharge: 2023-12-11 | Disposition: A | Discharge status: Home / Self Care | Source: Ambulatory Visit | Attending: Oncology | Admitting: Oncology

## 2023-12-11 DIAGNOSIS — C50812 Malignant neoplasm of overlapping sites of left female breast: Secondary | ICD-10-CM | POA: Diagnosis present

## 2023-12-11 DIAGNOSIS — Z171 Estrogen receptor negative status [ER-]: Secondary | ICD-10-CM | POA: Insufficient documentation

## 2023-12-11 MED ORDER — GADOBUTROL 1 MMOL/ML IV SOLN: 6.0000 mL | Freq: Once | INTRAVENOUS | Status: DC | PRN

## 2023-12-12 ENCOUNTER — Telehealth: Payer: Self-pay | Admitting: Oncology

## 2023-12-12 ENCOUNTER — Inpatient Hospital Stay: Payer: Medicare HMO | Attending: Oncology

## 2023-12-12 DIAGNOSIS — Z171 Estrogen receptor negative status [ER-]: Secondary | ICD-10-CM | POA: Insufficient documentation

## 2023-12-12 DIAGNOSIS — E538 Deficiency of other specified B group vitamins: Secondary | ICD-10-CM | POA: Diagnosis present

## 2023-12-12 DIAGNOSIS — Z79899 Other long term (current) drug therapy: Secondary | ICD-10-CM | POA: Diagnosis not present

## 2023-12-12 DIAGNOSIS — F32A Depression, unspecified: Secondary | ICD-10-CM | POA: Diagnosis not present

## 2023-12-12 DIAGNOSIS — Z853 Personal history of malignant neoplasm of breast: Secondary | ICD-10-CM | POA: Diagnosis not present

## 2023-12-12 DIAGNOSIS — C50812 Malignant neoplasm of overlapping sites of left female breast: Secondary | ICD-10-CM

## 2023-12-12 LAB — CMP (CANCER CENTER ONLY)
ALT: 21 U/L (ref 0–44)
AST: 36 U/L (ref 15–41)
Albumin: 4.3 g/dL (ref 3.5–5.0)
Alkaline Phosphatase: 43 U/L (ref 38–126)
Anion gap: 9 (ref 5–15)
BUN: 14 mg/dL (ref 6–20)
CO2: 23 mmol/L (ref 22–32)
Calcium: 8.9 mg/dL (ref 8.9–10.3)
Chloride: 105 mmol/L (ref 98–111)
Creatinine: 0.72 mg/dL (ref 0.44–1.00)
GFR, Estimated: >60 mL/min (ref >60)
Glucose, Bld: 105 mg/dL — ABNORMAL HIGH (ref 70–99)
Potassium: 4.2 mmol/L (ref 3.5–5.1)
Sodium: 137 mmol/L (ref 135–145)
Total Bilirubin: 0.7 mg/dL (ref 0.0–1.2)
Total Protein: 8 g/dL (ref 6.5–8.1)

## 2023-12-12 LAB — CBC WITH DIFFERENTIAL (CANCER CENTER ONLY)
Abs Immature Granulocytes: 0.02 10*3/uL (ref 0.00–0.07)
Basophils Absolute: 0 10*3/uL (ref 0.0–0.1)
Basophils Relative: 1 %
Eosinophils Absolute: 0.1 10*3/uL (ref 0.0–0.5)
Eosinophils Relative: 1 %
HCT: 40.9 % (ref 36.0–46.0)
Hemoglobin: 14 g/dL (ref 12.0–15.0)
Immature Granulocytes: 0 %
Lymphocytes Relative: 28 %
Lymphs Abs: 1.5 10*3/uL (ref 0.7–4.0)
MCH: 36.4 pg — ABNORMAL HIGH (ref 26.0–34.0)
MCHC: 34.2 g/dL (ref 30.0–36.0)
MCV: 106.2 fL — ABNORMAL HIGH (ref 80.0–100.0)
Monocytes Absolute: 0.5 10*3/uL (ref 0.1–1.0)
Monocytes Relative: 10 %
Neutro Abs: 3.2 10*3/uL (ref 1.7–7.7)
Neutrophils Relative %: 60 %
Platelet Count: 203 10*3/uL (ref 150–400)
RBC: 3.85 MIL/uL — ABNORMAL LOW (ref 3.87–5.11)
RDW: 12.5 % (ref 11.5–15.5)
WBC Count: 5.3 10*3/uL (ref 4.0–10.5)
nRBC: 0 % (ref 0.0–0.2)

## 2023-12-12 LAB — VITAMIN B12: Vitamin B-12: 372 pg/mL (ref 180–914)

## 2023-12-12 NOTE — Telephone Encounter (Signed)
 Pt stopped by desk after getting her labs done.   She stated that she was unable to complete the breast MRI due to her acid reflux. She stated she is still being billed for the appt. I told her to call her insurance and explain the situation and see what they said. I also gave her the [place] number for pt billing.  Pt stated she will have to get her appt r/s but wants to figure out the financial aspect first. She wanted me to let Dr.Yu know.

## 2023-12-13 LAB — CANCER ANTIGEN 27.29: CA 27.29: 19.4 U/mL (ref 0.0–38.6)

## 2023-12-13 LAB — CANCER ANTIGEN 15-3: CA 15-3: 17.9 U/mL (ref 0.0–25.0)

## 2023-12-15 ENCOUNTER — Inpatient Hospital Stay: Payer: Medicare HMO

## 2023-12-15 ENCOUNTER — Inpatient Hospital Stay: Payer: Medicare HMO | Admitting: Oncology

## 2023-12-17 ENCOUNTER — Other Ambulatory Visit: Payer: Self-pay | Admitting: Internal Medicine

## 2023-12-17 DIAGNOSIS — E559 Vitamin D deficiency, unspecified: Secondary | ICD-10-CM

## 2023-12-19 NOTE — Telephone Encounter (Signed)
 Requested medications are due for refill today.  yes  Requested medications are on the active medications list.  yes  Last refill. 09/28/2023 #12 0 rf  Future visit scheduled.   no  Notes to clinic.  Provider to review at this dosage.    Requested Prescriptions  Pending Prescriptions Disp Refills   Vitamin D , Ergocalciferol , (DRISDOL ) 1.25 MG (50000 UNIT) CAPS capsule [Pharmacy Med Name: VITAMIN D2 1.25MG (50,000 UNIT)] 12 capsule 0    Sig: Take 1 capsule (50,000 Units total) by mouth every 7 (seven) days.     Endocrinology:  Vitamins - Vitamin D  Supplementation 2 Failed - 12/19/2023  4:55 PM      Failed - Manual Review: Route requests for 50,000 IU strength to the provider      Failed - Vitamin D  in normal range and within 360 days    Vit D, 25-Hydroxy  Date Value Ref Range Status  09/26/2023 26 (L) 30 - 100 ng/mL Final    Comment:    Vitamin D  Status         25-OH Vitamin D : . Deficiency:                    <20 ng/mL Insufficiency:             20 - 29 ng/mL Optimal:                 > or = 30 ng/mL . For 25-OH Vitamin D  testing on patients on  D2-supplementation and patients for whom quantitation  of D2 and D3 fractions is required, the QuestAssureD(TM) 25-OH VIT D, (D2,D3), LC/MS/MS is recommended: order  code 27035 (patients >77yrs). . See Note 1 . Note 1 . For additional information, please refer to  http://education.QuestDiagnostics.com/faq/FAQ199  (This link is being provided for informational/ educational purposes only.)          Passed - Ca in normal range and within 360 days    Calcium   Date Value Ref Range Status  12/12/2023 8.9 8.9 - 10.3 mg/dL Final         Passed - Valid encounter within last 12 months    Recent Outpatient Visits           1 month ago Diarrhea, unspecified type   [facility] Rockney Cid, DO   2 months ago Primary hypertension   [facility] Rockney Cid, DO   3  months ago Lip laceration, initial encounter   [facility] Rockney Cid, [place]

## 2023-12-24 ENCOUNTER — Other Ambulatory Visit: Payer: Self-pay | Admitting: Internal Medicine

## 2023-12-24 DIAGNOSIS — F331 Major depressive disorder, recurrent, moderate: Secondary | ICD-10-CM

## 2023-12-26 ENCOUNTER — Ambulatory Visit: Admitting: Plastic Surgery

## 2023-12-26 NOTE — Telephone Encounter (Signed)
 Requested medication (s) are due for refill today: expired medication   Requested medication (s) are on the active medication list: yes   Last refill:  09/26/23- 12/25/23 #90 0 refills  Future visit scheduled: no   Notes to clinic:   expired medication , do you want to renew / refill Rx?     Requested Prescriptions  Pending Prescriptions Disp Refills   escitalopram  (LEXAPRO ) 20 MG tablet [Pharmacy Med Name: ESCITALOPRAM  20 MG TABLET] 90 tablet 0    Sig: TAKE 1 TABLET BY MOUTH EVERY DAY     Psychiatry:  Antidepressants - SSRI Passed - 12/26/2023  2:37 PM      Passed - Completed PHQ-2 or PHQ-9 in the last 360 days      Passed - Valid encounter within last 6 months    Recent Outpatient Visits           1 month ago Diarrhea, unspecified type   [facility] [address] Bernardo Fend, DO   3 months ago Primary hypertension   [facility] Bernardo Fend, DO   3 months ago Lip laceration, initial encounter   [facility] Bernardo Fend, [place]

## 2023-12-29 ENCOUNTER — Inpatient Hospital Stay (HOSPITAL_BASED_OUTPATIENT_CLINIC_OR_DEPARTMENT_OTHER): Admitting: Oncology

## 2023-12-29 ENCOUNTER — Inpatient Hospital Stay

## 2023-12-29 ENCOUNTER — Encounter: Payer: Self-pay | Admitting: Oncology

## 2023-12-29 VITALS — BP 137/104 | HR 87 | Temp 97.7°F | Resp 18 | Wt 145.8 lb

## 2023-12-29 DIAGNOSIS — E538 Deficiency of other specified B group vitamins: Secondary | ICD-10-CM

## 2023-12-29 DIAGNOSIS — D7589 Other specified diseases of blood and blood-forming organs: Secondary | ICD-10-CM | POA: Diagnosis not present

## 2023-12-29 DIAGNOSIS — C50812 Malignant neoplasm of overlapping sites of left female breast: Secondary | ICD-10-CM | POA: Diagnosis not present

## 2023-12-29 DIAGNOSIS — F1090 Alcohol use, unspecified, uncomplicated: Secondary | ICD-10-CM | POA: Diagnosis not present

## 2023-12-29 DIAGNOSIS — Z171 Estrogen receptor negative status [ER-]: Secondary | ICD-10-CM

## 2023-12-29 DIAGNOSIS — F32A Depression, unspecified: Secondary | ICD-10-CM

## 2023-12-29 MED ORDER — CYANOCOBALAMIN 1000 MCG/ML IJ SOLN
1000.0000 ug | Freq: Once | INTRAMUSCULAR | Status: AC
  Administered 2023-12-29: 1000 ug via INTRAMUSCULAR
  Filled 2023-12-29: qty 1

## 2023-12-29 NOTE — Assessment & Plan Note (Addendum)
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  Labs reviewed and discussed patient.  Cancer markers are within normal limits. Continue annual mammogram surveillance. -over due in November 2024- missed appt. reschedule Recommend patient to continue calcium  and vitamin D  supplementation.   Patient desires breast reduction, patient was referred to plastic surgery.   Continue annual bilateral screening mammogram and annual MRI breast.

## 2023-12-29 NOTE — Assessment & Plan Note (Signed)
 Recommend alcohol cessation.

## 2023-12-29 NOTE — Assessment & Plan Note (Signed)
 Please due to chronic alcohol use. Continue B12 supplementation

## 2023-12-29 NOTE — Assessment & Plan Note (Signed)
 Recommend patient to follow-up with psychiatrist, also discussed insomnia and alcohol problem.

## 2023-12-29 NOTE — Assessment & Plan Note (Signed)
 She does not  take vitamin B12 1000 mcg daily consistently.  Recommend B12 injections monthly

## 2023-12-29 NOTE — Progress Notes (Signed)
 Hematology/Oncology Progress note [phone] [phone]      Patient Care Team: Bernardo Fend, DO as PCP - General (Internal Medicine) Patient, No Pcp Per (General Practice) Babara Call, MD as Consulting Physician (Hematology and Oncology)  ASSESSMENT & PLAN:   Cancer Staging  Malignant neoplasm of overlapping sites of left breast in female, estrogen receptor negative (HCC) Staging form: Breast, AJCC 8th Edition - Clinical stage from 08/25/2017: Stage IIB (cT2(2), cN0, cM0, G3, ER-, PR-, HER2-) - Signed by Babara Call, MD on 08/25/2017  Malignant neoplasm of overlapping sites of left breast in female, estrogen receptor negative (HCC) # 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  Labs reviewed and discussed patient.  Cancer markers are within normal limits. Continue annual mammogram surveillance. -over due in November 2024- missed appt. reschedule Recommend patient to continue calcium  and vitamin D  supplementation.   Patient desires breast reduction, patient was referred to plastic surgery.   Continue annual bilateral screening mammogram and annual MRI breast.   B12 deficiency She does not  take vitamin B12 1000 mcg daily consistently.  Recommend B12 injections monthly   Macrocytosis without anemia Please due to chronic alcohol use. Continue B12 supplementation  Alcohol use Recommend alcohol cessation  Depression Recommend patient to follow-up with psychiatrist, also discussed insomnia and alcohol problem.   Orders Placed This Encounter  Procedures   MR BREAST BILATERAL W WO CONTRAST INC CAD    Standing Status:   Future    Expected Date:   01/05/2024    Expiration Date:   12/28/2024    If indicated for the ordered procedure, I authorize the administration of contrast media per Radiology protocol:   Yes    What is the patient's sedation requirement?:   No  Sedation    Does the patient have a pacemaker or implanted devices?:   No    Radiology Contrast Protocol - do NOT remove file path:   [email or web address]    Preferred imaging location?:   [facility] (table limit - 500lbs)   CBC with Differential (Cancer Center Only)    Standing Status:   Future    Expected Date:   06/29/2024    Expiration Date:   09/27/2024   CMP (Cancer Center only)    Standing Status:   Future    Expected Date:   06/29/2024    Expiration Date:   09/27/2024   Vitamin B12    Standing Status:   Future    Expected Date:   06/29/2024    Expiration Date:   09/27/2024   Follow up in 6 months.   All questions were answered. The patient knows to call the clinic with any problems, questions or concerns.  Call Babara, MD, PhD [facility] Hematology Oncology 12/29/2023      REASON FOR VISIT Follow up for triple negative breast cancer    HISTORY OF PRESENTING ILLNESS:  # 11/2017 Multifocal triple negative breast cancer clinically Stage IIB (mpT2 pN0,cM0), status post left breast lumpectomy and a sentinel lymph node biopsy. two separate foci of invasive mammary carcinoma with associated tumor necrosis, DCIS present, fibroadenoma 12mm, Foci 1 is 40mm, and foci 2 is 35mm, margins are negative. Sentinel LN 0/0 involved. Negative LVI declined neoadjuvant or adjuvant chemotherapy  # Genetic testing: BRCA neg Testing did not reveal a pathogenic mutation in any of the genes analyzed.  #Iron  deficiency anemia status post multiple IV iron  infusions. # Uterus Fibroid disease;  underwent endometrial biopsy establish care with GYN Dr. Leonce and had D&C, polypectomy, myomectomy   # Thyroid  nodule, previously ordered thyroid  ultrasound not done.  05/31/21 Bilateral mammogram showed no mammographic evidence of malignancy   INTERVAL HISTORY Cindy Brewer is a 53 y.o. female who has above history reviewed by me today presents for  follow up visit for Triple negative breast cancer  Patient reports feeling well.   She was not able to finish her MRI breast bilaterally due to acid reflux.  Now symptoms are better. she continues to drink alcohol, usually at night to help sleeping. .  Review of Systems  Constitutional:  Negative for appetite change, chills, fatigue and fever.  HENT:   Negative for hearing loss and voice change.   Eyes:  Negative for eye problems.  Respiratory:  Negative for chest tightness, cough and shortness of breath.   Cardiovascular:  Negative for chest pain.  Gastrointestinal:  Negative for abdominal distention, abdominal pain and blood in stool.  Endocrine: Negative for hot flashes.  Genitourinary:  Negative for difficulty urinating and frequency.   Musculoskeletal:  Negative for arthralgias.  Skin:  Negative for itching and rash.  Neurological:  Negative for extremity weakness.  Hematological:  Negative for adenopathy.  Psychiatric/Behavioral:  Negative for confusion.      MEDICAL HISTORY:  Past Medical History:  Diagnosis Date   Anemia    Anxiety    Arthritis    right knee    Cancer (HCC) 2019   BREAST- Left   Depression    Fibroid    GERD (gastroesophageal reflux disease)     SURGICAL HISTORY: Past Surgical History:  Procedure Laterality Date   BREAST BIOPSY Left 2019   IDC   BREAST LUMPECTOMY WITH AXILLARY LYMPH NODE BIOPSY Left 11/24/2017   Procedure: LEFT BREAST LUMPECTOMY WITH LEFT SENTINEL NODE LYMPH NODE BIOPSY;  Surgeon: Curvin Deward MOULD, MD;  Location: [facility] ORS;  Service: General;  Laterality: Left;   CESAREAN SECTION     EYE SURGERY     DETACHED RETINA 4/519   HYSTEROSCOPY WITH D & C N/A 03/14/2019   Procedure: DILATATION AND CURETTAGE LELDON MERL;  Surgeon: Leonce Garnette BIRCH, MD;  Location: [facility] ORS;  Service: Gynecology;  Laterality: N/A;   IR EMBO TUMOR ORGAN ISCHEMIA INFARCT INC GUIDE ROADMAPPING      SOCIAL HISTORY: Social History    Socioeconomic History   Marital status: Divorced    Spouse name: Not on file   Number of children: 5   Years of education: Not on file   Highest education level: Some college, no degree  Occupational History   Not on file  Tobacco Use   Smoking status: Former    Current packs/day: 0.00    Average packs/day: 0.3 packs/day for 13.0 years (3.3 ttl pk-yrs)    Types: Cigars, Cigarettes    Start date: 05/04/2009    Quit date: 05/04/2022    Years since quitting: 1.6   Smokeless tobacco: Never   Tobacco comments:    Pt states 1 black and mild daily   Vaping Use   Vaping status: Never Used  Substance and Sexual Activity   Alcohol use: Yes    Alcohol/week: 12.0 standard drinks of alcohol    Types: 12 Shots of liquor per week    Comment: weekends   Drug use: No   Sexual activity: Not Currently    Birth control/protection: None  Other Topics Concern   Not on file  Social History Narrative   **  Merged History Encounter **       Social Drivers of Health   Financial Resource Strain: Low Risk  (08/29/2023)   Overall Financial Resource Strain (CARDIA)    Difficulty of Paying Living Expenses: Not hard at all  Food Insecurity: Food Insecurity Present (08/29/2023)   Hunger Vital Sign    Worried About Running Out of Food in the Last Year: Sometimes true    Ran Out of Food in the Last Year: Patient declined  Transportation Needs: No Transportation Needs (08/29/2023)   PRAPARE - Administrator, Civil Service (Medical): No    Lack of Transportation (Non-Medical): No  Physical Activity: Sufficiently Active (08/29/2023)   Exercise Vital Sign    Days of Exercise per Week: 4 days    Minutes of Exercise per Session: 70 min  Stress: Stress Concern Present (08/29/2023)   [employer] of Occupational Health - Occupational Stress Questionnaire    Feeling of Stress : To some extent  Social Connections: Socially Isolated (08/29/2023)   Social Connection and Isolation Panel     Frequency of Communication with Friends and Family: Once a week    Frequency of Social Gatherings with Friends and Family: Never    Attends Religious Services: Never    Database administrator or Organizations: No    Attends Banker Meetings: Never    Marital Status: Divorced  Catering manager Violence: Not At Risk (02/09/2023)   Humiliation, Afraid, Rape, and Kick questionnaire    Fear of Current or Ex-Partner: No    Emotionally Abused: No    Physically Abused: No    Sexually Abused: No    FAMILY HISTORY: Family History  Problem Relation Age of Onset   Kidney disease Mother    Heart disease Mother    Stroke Mother    Hepatitis C Mother    Dementia Father    Blindness Father    Kidney disease Sister    Schizophrenia Brother    Hypertension Brother    Diabetes Brother    Schizophrenia Paternal Aunt    Suicidality Paternal Aunt    Stroke Maternal Grandmother    Breast cancer Neg Hx     ALLERGIES:  is allergic to demerol [meperidine], oxycodone, sulfa antibiotics, and percocet [oxycodone-acetaminophen ].  MEDICATIONS:  Current Outpatient Medications  Medication Sig Dispense Refill   Acetaminophen  (TYLENOL  PO) Take by mouth as needed.     amLODipine  (NORVASC ) 5 MG tablet Take 1 tablet (5 mg total) by mouth daily. 90 tablet 1   cyanocobalamin  (VITAMIN B12) 1000 MCG tablet Take 1 tablet (1,000 mcg total) by mouth daily. 90 tablet 1   escitalopram  (LEXAPRO ) 20 MG tablet TAKE 1 TABLET BY MOUTH EVERY DAY 90 tablet 0   folic acid  (FOLVITE ) 1 MG tablet Take 1 tablet (1 mg total) by mouth daily. 90 tablet 1   hydrochlorothiazide  (HYDRODIURIL ) 25 MG tablet Take 1 tablet (25 mg total) by mouth daily. 90 tablet 1   metoprolol  tartrate (LOPRESSOR ) 25 MG tablet Take 1 tablet (25 mg total) by mouth 2 (two) times daily. 180 tablet 1   Multiple Vitamin (MULTIVITAMIN WITH MINERALS) TABS tablet Take 1 tablet by mouth daily.     omeprazole  (PRILOSEC) 20 MG capsule Take 1 capsule (20  mg total) by mouth daily as needed (acid reflux).     Vitamin D , Ergocalciferol , (DRISDOL ) 1.25 MG (50000 UNIT) CAPS capsule Take 1 capsule (50,000 Units total) by mouth every 7 (seven) days. 12 capsule 0  No current facility-administered medications for this visit.     PHYSICAL EXAMINATION: ECOG PERFORMANCE STATUS: 0 - Asymptomatic Vitals:   12/29/23 1222 12/29/23 1229  BP: (!) 140/102 (!) 137/104  Pulse: 87   Resp: 18   Temp: 97.7 F (36.5 C)   SpO2: 95%    Filed Weights   12/29/23 1222  Weight: 145 lb 12.8 oz (66.1 kg)     Physical Exam Constitutional:      General: She is not in acute distress.    Appearance: She is not diaphoretic.  HENT:     Head: Normocephalic.   Eyes:     General: No scleral icterus.  Neck:     Vascular: No JVD.   Cardiovascular:     Rate and Rhythm: Normal rate.  Pulmonary:     Effort: Pulmonary effort is normal. No respiratory distress.     Breath sounds: No wheezing.  Abdominal:     General: There is no distension.   Musculoskeletal:        General: Normal range of motion.     Cervical back: Normal range of motion.  Lymphadenopathy:     Cervical: No cervical adenopathy.   Skin:    Findings: No rash.   Neurological:     Mental Status: She is alert and oriented to person, place, and time. Mental status is at baseline.     Motor: No abnormal muscle tone.   Psychiatric:        Mood and Affect: Mood and affect normal.        Cognition and Memory: Memory normal.         LABORATORY DATA:  I have reviewed the data as listed     Latest Ref Rng & Units 12/12/2023   11:30 AM 09/26/2023   11:29 AM 06/07/2023   10:25 AM  CBC  WBC 4.0 - 10.5 K/uL 5.3  5.7  7.1   Hemoglobin 12.0 - 15.0 g/dL 85.9  86.0  85.8   Hematocrit 36.0 - 46.0 % 40.9  41.4  41.4   Platelets 150 - 400 K/uL 203  220  236       Latest Ref Rng & Units 12/12/2023   11:30 AM 06/09/2023   12:07 PM 06/07/2023   10:26 AM  CMP  Glucose 70 - 99 mg/dL 894  842   883   BUN 6 - 20 mg/dL 14  16  17    Creatinine 0.44 - 1.00 mg/dL 9.27  8.95  8.98   Sodium 135 - 145 mmol/L 137  138  137   Potassium 3.5 - 5.1 mmol/L 4.2  3.5  2.9   Chloride 98 - 111 mmol/L 105  101  99   CO2 22 - 32 mmol/L 23  26  27    Calcium  8.9 - 10.3 mg/dL 8.9  9.3  9.4   Total Protein 6.5 - 8.1 g/dL 8.0   8.3   Total Bilirubin 0.0 - 1.2 mg/dL 0.7   0.9   Alkaline Phos 38 - 126 U/L 43   42   AST 15 - 41 U/L 36   24   ALT 0 - 44 U/L 21   21

## 2024-01-02 ENCOUNTER — Ambulatory Visit: Admission: RE | Admit: 2024-01-02 | Source: Ambulatory Visit

## 2024-01-03 ENCOUNTER — Ambulatory Visit: Admission: RE | Admit: 2024-01-03 | Source: Ambulatory Visit

## 2024-01-12 ENCOUNTER — Encounter: Payer: Self-pay | Admitting: Internal Medicine

## 2024-01-12 ENCOUNTER — Ambulatory Visit (INDEPENDENT_AMBULATORY_CARE_PROVIDER_SITE_OTHER): Admitting: Internal Medicine

## 2024-01-12 ENCOUNTER — Other Ambulatory Visit: Payer: Self-pay

## 2024-01-12 VITALS — BP 134/78 | HR 100 | Temp 98.3°F | Resp 16 | Ht 66.0 in | Wt 143.4 lb

## 2024-01-12 DIAGNOSIS — R42 Dizziness and giddiness: Secondary | ICD-10-CM

## 2024-01-12 MED ORDER — MECLIZINE HCL 12.5 MG PO TABS: 12.5000 mg | ORAL_TABLET | Freq: Two times a day (BID) | ORAL | 0 refills | Status: DC | PRN

## 2024-01-12 NOTE — Progress Notes (Signed)
 Acute Office Visit  Subjective:     Patient ID: Cindy Brewer, female    DOB: 1971/05/21, 53 y.o.   MRN: [id number]  Chief Complaint  Patient presents with   Dizziness    Dizziness Pertinent negatives include no chest pain, chills, fever, headaches or sore throat.   Patient is in today for dizziness.   Discussed the use of AI scribe software for clinical note transcription with the patient, who gave verbal consent to proceed.  History of Present Illness Adya Wirz is a 53 year old female with hypertension and a heart murmur who presents with dizziness and imbalance.  She experiences a constant spinning sensation for the past week, affecting her balance and not improving with rest or slow movements. She has fallen three times in the past nine months and avoids activities like walking her dog or taking a shower due to the risk of falling. Her blood pressure was elevated for two days despite regular medication, leading her to double her dose without improvement. She has a heart murmur and experiences shortness of breath, with no recent changes in these symptoms. She manages arthritis in her knee and back with Tylenol . Her daughter, who is unemployed, has been staying with her for the past five months, and both take Lexapro , which upsets her stomach, leading her to sometimes skip doses.    Review of Systems  Constitutional:  Negative for chills and fever.  HENT:  Negative for ear pain, sinus pain and sore throat.   Eyes:  Negative for blurred vision.  Respiratory:  Negative for shortness of breath.   Cardiovascular:  Negative for chest pain and palpitations.  Neurological:  Positive for dizziness. Negative for headaches.        Objective:    BP 134/78 (Cuff Size: Normal)   Pulse 100   Temp 98.3 F (36.8 C) (Oral)   Resp 16   Ht 5' 6 (1.676 m)   Wt 143 lb 6.4 oz (65 kg)   LMP 03/27/2020   SpO2 98%   BMI 23.15 kg/m  BP Readings from Last 3  Encounters:  01/12/24 134/78  12/29/23 (!) 137/104  11/15/23 130/82   Wt Readings from Last 3 Encounters:  01/12/24 143 lb 6.4 oz (65 kg)  12/29/23 145 lb 12.8 oz (66.1 kg)  11/15/23 145 lb 6.4 oz (66 kg)      Physical Exam Constitutional:      Appearance: Normal appearance.  HENT:     Head: Normocephalic and atraumatic.     Right Ear: Tympanic membrane, ear canal and external ear normal.     Left Ear: Tympanic membrane, ear canal and external ear normal.     Nose: Nose normal.     Mouth/Throat:     Mouth: Mucous membranes are moist.     Pharynx: Oropharynx is clear.  Eyes:     Extraocular Movements: Extraocular movements intact.     Conjunctiva/sclera: Conjunctivae normal.     Pupils: Pupils are equal, round, and reactive to light.  Cardiovascular:     Rate and Rhythm: Normal rate and regular rhythm.  Pulmonary:     Effort: Pulmonary effort is normal.     Breath sounds: Normal breath sounds.  Skin:    General: Skin is warm and dry.  Neurological:     General: No focal deficit present.     Mental Status: She is alert. Mental status is at baseline.     Motor: No weakness.     Coordination: Coordination  abnormal.     Comments: Mildly positive romberg, negative heel-to-shin and negative finger-to-nose  Psychiatric:        Mood and Affect: Mood normal.        Behavior: Behavior normal.     No results found for any visits on 01/12/24.      Assessment & Plan:   Assessment & Plan Dizziness Week-long dizziness with spinning sensation, affecting balance. Differential includes vertigo, cardiac causes, and medication side effects. Blood pressure slightly elevated. History of heart murmur and past falls. - EKG with normal sinus rhythm - Consider Xio monitor if  symptoms persist. - Patient taking Lexapro  inconsistently - take daily and change blood pressure medications to hydrochlorothiazide  and metoprolol  in the morning and second dose of metoprolol  and amlodipine  and  Lexapro  in the evening.   Thyroid  dysfunction Previous thyroid  irregularities could contribute to dizziness. - Order thyroid  function tests.  Vitamin B12 deficiency On supplementation due to previous deficiency. MCV elevated, hemoglobin normal. - Continue vitamin B12 supplementation.  General Health Maintenance Advised on hydration and avoiding heat due to dizziness risk. - Advise to stay hydrated. - Advise to avoid going out in the heat.  - Basic Metabolic Panel (BMET) - TSH - EKG 12-Lead - meclizine  (ANTIVERT ) 12.5 MG tablet; Take 1 tablet (12.5 mg total) by mouth 2 (two) times daily as needed for dizziness.  Dispense: 30 tablet; Refill: 0   Return in about 4 weeks (around 02/09/2024).  Sharyle Fischer, DO

## 2024-01-23 ENCOUNTER — Ambulatory Visit: Admitting: Plastic Surgery

## 2024-02-02 ENCOUNTER — Inpatient Hospital Stay: Attending: Oncology

## 2024-02-02 ENCOUNTER — Encounter: Payer: Self-pay | Admitting: Oncology

## 2024-02-02 DIAGNOSIS — C50812 Malignant neoplasm of overlapping sites of left female breast: Secondary | ICD-10-CM

## 2024-02-02 DIAGNOSIS — E538 Deficiency of other specified B group vitamins: Secondary | ICD-10-CM | POA: Diagnosis not present

## 2024-02-02 MED ORDER — CYANOCOBALAMIN 1000 MCG/ML IJ SOLN
1000.0000 ug | Freq: Once | INTRAMUSCULAR | Status: AC
  Administered 2024-02-02: 1000 ug via INTRAMUSCULAR
  Filled 2024-02-02: qty 1

## 2024-02-09 ENCOUNTER — Encounter: Payer: Self-pay | Admitting: Internal Medicine

## 2024-02-09 ENCOUNTER — Ambulatory Visit: Admitting: Internal Medicine

## 2024-02-09 ENCOUNTER — Encounter: Payer: Self-pay | Admitting: Oncology

## 2024-02-09 ENCOUNTER — Other Ambulatory Visit: Payer: Self-pay

## 2024-02-09 DIAGNOSIS — N921 Excessive and frequent menstruation with irregular cycle: Secondary | ICD-10-CM | POA: Diagnosis not present

## 2024-02-09 DIAGNOSIS — R42 Dizziness and giddiness: Secondary | ICD-10-CM | POA: Diagnosis not present

## 2024-02-09 DIAGNOSIS — F331 Major depressive disorder, recurrent, moderate: Secondary | ICD-10-CM | POA: Diagnosis not present

## 2024-02-09 DIAGNOSIS — R232 Flushing: Secondary | ICD-10-CM | POA: Diagnosis not present

## 2024-02-09 DIAGNOSIS — R7989 Other specified abnormal findings of blood chemistry: Secondary | ICD-10-CM | POA: Diagnosis not present

## 2024-02-09 MED ORDER — MECLIZINE HCL 12.5 MG PO TABS: 12.5000 mg | ORAL_TABLET | Freq: Two times a day (BID) | ORAL | 0 refills | Status: DC | PRN

## 2024-02-09 MED ORDER — ESCITALOPRAM OXALATE 20 MG PO TABS: 20.0000 mg | ORAL_TABLET | Freq: Every day | ORAL | 1 refills | Status: AC

## 2024-02-09 NOTE — Progress Notes (Signed)
 Acute Office Visit  Subjective:     Patient ID: Cindy Brewer, female    DOB: 03/27/1971, 53 y.o.   MRN: [id number]  Chief Complaint  Patient presents with   Follow-up    4 week recheck    Dizziness Pertinent negatives include no chills, fever or headaches.   Patient is in today for follow up on dizziness.   Discussed the use of AI scribe software for clinical note transcription with the patient, who gave verbal consent to proceed.  History of Present Illness  Cindy Brewer is a 53 year old female with hypertension who presents with dizziness.  She experiences dizziness, which she attributes to changes in her blood pressure. Taking her blood pressure medication alleviates the dizziness. She takes her medication in the morning to ensure effectiveness throughout the day. She is currently taking Amlodipine  5 mg, hydrochlorothiazide  25 mg, and Metoprolol  25 mg BID for blood pressure. She is also taking Lexapro  20 mg daily. She has a family history of a cousin who passed away last year after taking a hot shower, which she attributes to blood pressure issues.    Review of Systems  Constitutional:  Negative for chills and fever.  Eyes:  Negative for blurred vision.  Neurological:  Positive for dizziness. Negative for headaches.        Objective:    BP 124/78 (Cuff Size: Large)   Pulse 84   Temp 98.3 F (36.8 C) (Oral)   Resp 16   Ht 5' 6 (1.676 m)   Wt 146 lb 9.6 oz (66.5 kg)   LMP 03/27/2020   SpO2 98%   BMI 23.66 kg/m  BP Readings from Last 3 Encounters:  02/09/24 124/78  01/12/24 134/78  12/29/23 (!) 137/104   Wt Readings from Last 3 Encounters:  02/09/24 146 lb 9.6 oz (66.5 kg)  01/12/24 143 lb 6.4 oz (65 kg)  12/29/23 145 lb 12.8 oz (66.1 kg)      Physical Exam Constitutional:      Appearance: Normal appearance.  HENT:     Head: Normocephalic and atraumatic.  Eyes:     Conjunctiva/sclera: Conjunctivae normal.  Cardiovascular:      Rate and Rhythm: Normal rate and regular rhythm.  Pulmonary:     Effort: Pulmonary effort is normal.     Breath sounds: Normal breath sounds.  Skin:    General: Skin is warm and dry.  Neurological:     General: No focal deficit present.     Mental Status: She is alert. Mental status is at baseline.  Psychiatric:        Mood and Affect: Mood normal.        Behavior: Behavior normal.     No results found for any visits on 02/09/24.      Assessment & Plan:   Assessment & Plan Hypertension with orthostatic dizziness Hypertension well-controlled at 124/78 mmHg. Orthostatic dizziness likely due to blood pressure changes. - Advised taking time when changing positions to mitigate dizziness. - Discussed importance of morning blood pressure medication to maintain control and reduce stroke risk, especially after hot showers. - Refilled blood pressure medication as needed upon pharmacy request. - Ordered lab tests: kidney, liver, electrolytes, thyroid  function.  Depression Depression managed effectively with Lexapro . - Refilled Lexapro  prescription.  - escitalopram  (LEXAPRO ) 20 MG tablet; Take 1 tablet (20 mg total) by mouth daily.  Dispense: 90 tablet; Refill: 1 - TSH - CBC w/Diff/Platelet - Comprehensive Metabolic Panel (CMET) - meclizine  (ANTIVERT ) 12.5 MG  tablet; Take 1 tablet (12.5 mg total) by mouth 2 (two) times daily as needed for dizziness.  Dispense: 30 tablet; Refill: 0   Return in about 6 months (around 08/11/2024).  Sharyle Fischer, DO

## 2024-02-12 ENCOUNTER — Ambulatory Visit: Payer: Self-pay | Admitting: Internal Medicine

## 2024-02-12 DIAGNOSIS — R7989 Other specified abnormal findings of blood chemistry: Secondary | ICD-10-CM

## 2024-02-13 LAB — CBC WITH DIFFERENTIAL/PLATELET
Absolute Lymphocytes: 1406 {cells}/uL (ref 850–3900)
Absolute Monocytes: 525 {cells}/uL (ref 200–950)
Basophils Absolute: 39 {cells}/uL (ref 0–200)
Basophils Relative: 0.9 %
Eosinophils Absolute: 69 {cells}/uL (ref 15–500)
Eosinophils Relative: 1.6 %
HCT: 39.5 % (ref 35.0–45.0)
Hemoglobin: 13.3 g/dL (ref 11.7–15.5)
MCH: 36.2 pg — ABNORMAL HIGH (ref 27.0–33.0)
MCHC: 33.7 g/dL (ref 32.0–36.0)
MCV: 107.6 fL — ABNORMAL HIGH (ref 80.0–100.0)
MPV: 10.6 fL (ref 7.5–12.5)
Monocytes Relative: 12.2 %
Neutro Abs: 2262 {cells}/uL (ref 1500–7800)
Neutrophils Relative %: 52.6 %
Platelets: 244 Thousand/uL (ref 140–400)
RBC: 3.67 Million/uL — ABNORMAL LOW (ref 3.80–5.10)
RDW: 11.7 % (ref 11.0–15.0)
Total Lymphocyte: 32.7 %
WBC: 4.3 Thousand/uL (ref 3.8–10.8)

## 2024-02-13 LAB — TEST AUTHORIZATION

## 2024-02-13 LAB — COMPREHENSIVE METABOLIC PANEL WITH GFR
AG Ratio: 1.5 (calc) (ref 1.0–2.5)
ALT: 25 U/L (ref 6–29)
AST: 41 U/L — ABNORMAL HIGH (ref 10–35)
Albumin: 4.5 g/dL (ref 3.6–5.1)
Alkaline phosphatase (APISO): 45 U/L (ref 37–153)
BUN: 14 mg/dL (ref 7–25)
CO2: 30 mmol/L (ref 20–32)
Calcium: 10.5 mg/dL — ABNORMAL HIGH (ref 8.6–10.4)
Chloride: 102 mmol/L (ref 98–110)
Creat: 0.86 mg/dL (ref 0.50–1.03)
Globulin: 3.1 g/dL (ref 1.9–3.7)
Glucose, Bld: 108 mg/dL — ABNORMAL HIGH (ref 65–99)
Potassium: 5.1 mmol/L (ref 3.5–5.3)
Sodium: 141 mmol/L (ref 135–146)
Total Bilirubin: 0.5 mg/dL (ref 0.2–1.2)
Total Protein: 7.6 g/dL (ref 6.1–8.1)
eGFR: 82 mL/min/1.73m2 (ref >=60)

## 2024-02-13 LAB — THYROID PROFILE - CHCC
Free Thyroxine Index: 2.1 (ref 1.4–3.8)
T3 Uptake: 34 % (ref 22–35)
T4, Total: 6.1 ug/dL (ref 5.1–11.9)

## 2024-02-13 LAB — TSH: TSH: 0.27 m[IU]/L — ABNORMAL LOW

## 2024-02-15 ENCOUNTER — Ambulatory Visit: Payer: Medicare HMO

## 2024-02-15 DIAGNOSIS — Z0000 Encounter for general adult medical examination without abnormal findings: Secondary | ICD-10-CM | POA: Diagnosis not present

## 2024-02-15 NOTE — Patient Instructions (Addendum)
 Cindy Brewer , Thank you for taking time out of your busy schedule to complete your Annual Wellness Visit with me. I enjoyed our conversation and look forward to speaking with you again next year. I, as well as your care team,  appreciate your ongoing commitment to your health goals. Please review the following plan we discussed and let me know if I can assist you in the future.   Follow up Visits: 02/20/25 @ 9:30 AM BY PHONE We will see or speak with you next year for your Next Medicare AWV with our clinical staff Have you seen your provider in the last 6 months (3 months if uncontrolled diabetes)? Yes  Clinician Recommendations:  Aim for 30 minutes of exercise or brisk walking, 6-8 glasses of water, and 5 servings of fruits and vegetables each day. TAKE CARE!      This is a list of the screenings recommended for you:  Health Maintenance  Topic Date Due   COVID-19 Vaccine (1) Never done   Hepatitis B Vaccine (1 of 3 - 19+ 3-dose series) Never done   Zoster (Shingles) Vaccine (1 of 2) Never done   Pneumococcal Vaccine for age over 61 (1 of 1 - PCV) Never done   Pap with HPV screening  01/02/2023   Flu Shot  02/02/2024   Mammogram  07/03/2024   Medicare Annual Wellness Visit  02/14/2025   Cologuard (Stool DNA test)  10/10/2026   Hepatitis C Screening  Completed   HIV Screening  Completed   HPV Vaccine  Aged Out   Meningitis B Vaccine  Aged Out   DTaP/Tdap/Td vaccine  Discontinued    Advanced directives: (ACP Link)Information on Advanced Care Planning can be found at   Secretary of [facility] Advance Health Care Directives Advance Health Care Directives. [email or web address]  Advance Care Planning is important because it:  [x]  Makes sure you receive the medical care that is consistent with your values, goals, and preferences  [x]  It provides guidance to your family and loved ones and reduces their decisional burden about whether or not they are making the right decisions based on your  wishes.  Follow the link provided in your after visit summary or read over the paperwork we have mailed to you to help you started getting your Advance Directives in place. If you need assistance in completing these, please reach out to us  so that we can help you!

## 2024-02-15 NOTE — Progress Notes (Signed)
 Subjective:   Cindy Brewer is a 53 y.o. who presents for a Medicare Wellness preventive visit.  As a reminder, Annual Wellness Visits don't include a physical exam, and some assessments may be limited, especially if this visit is performed virtually. We may recommend an in-person follow-up visit with your provider if needed.  Visit Complete: Virtual I connected with  Cindy Brewer on 02/15/24 by a audio enabled telemedicine application and verified that I am speaking with the correct person using two identifiers.  Patient Location: Home  Provider Location: Home Office  I discussed the limitations of evaluation and management by telemedicine. The patient expressed understanding and agreed to proceed.  Vital Signs: Because this visit was a virtual/telehealth visit, some criteria may be missing or patient reported. Any vitals not documented were not able to be obtained and vitals that have been documented are patient reported.  VideoDeclined- This patient declined Librarian, academic. Therefore the visit was completed with audio only.  Persons Participating in Visit: Patient.  AWV Questionnaire: No: Patient Medicare AWV questionnaire was not completed prior to this visit.  Cardiac Risk Factors include: hypertension;smoking/ tobacco exposure     Objective:    There were no vitals filed for this visit. There is no height or weight on file to calculate BMI.     02/15/2024    9:44 AM 02/09/2023    9:46 AM 12/09/2021    1:31 PM 06/10/2021    1:46 PM 02/17/2021   12:09 PM 08/04/2020    2:11 PM 07/01/2020    1:00 PM  Advanced Directives  Does Patient Have a Medical Advance Directive? No No No No No No No  Would patient like information on creating a medical advance directive? No - Patient declined      No - Patient declined    Current Medications (verified) Outpatient Encounter Medications as of 02/15/2024  Medication Sig   Acetaminophen   (TYLENOL  PO) Take by mouth as needed.   amLODipine  (NORVASC ) 5 MG tablet Take 1 tablet (5 mg total) by mouth daily.   cyanocobalamin  (VITAMIN B12) 1000 MCG tablet Take 1 tablet (1,000 mcg total) by mouth daily. (Patient taking differently: Take 1,000 mcg by mouth daily. TAKES VIT B12 SHOTS EVERY 3 WEEKS)   escitalopram  (LEXAPRO ) 20 MG tablet Take 1 tablet (20 mg total) by mouth daily.   hydrochlorothiazide  (HYDRODIURIL ) 25 MG tablet Take 1 tablet (25 mg total) by mouth daily.   meclizine  (ANTIVERT ) 12.5 MG tablet Take 1 tablet (12.5 mg total) by mouth 2 (two) times daily as needed for dizziness.   metoprolol  tartrate (LOPRESSOR ) 25 MG tablet Take 1 tablet (25 mg total) by mouth 2 (two) times daily.   Multiple Vitamin (MULTIVITAMIN WITH MINERALS) TABS tablet Take 1 tablet by mouth daily.   omeprazole  (PRILOSEC) 20 MG capsule Take 1 capsule (20 mg total) by mouth daily as needed (acid reflux).   Vitamin D , Ergocalciferol , (DRISDOL ) 1.25 MG (50000 UNIT) CAPS capsule Take 1 capsule (50,000 Units total) by mouth every 7 (seven) days.   folic acid  (FOLVITE ) 1 MG tablet Take 1 tablet (1 mg total) by mouth daily. (Patient not taking: Reported on 02/15/2024)   No facility-administered encounter medications on file as of 02/15/2024.    Allergies (verified) Demerol [meperidine], Oxycodone, Sulfa antibiotics, and Percocet [oxycodone-acetaminophen ]   History: Past Medical History:  Diagnosis Date   Anemia    Anxiety    Arthritis    right knee    Cancer (HCC) 2019  BREAST- Left   Depression    Fibroid    GERD (gastroesophageal reflux disease)    Past Surgical History:  Procedure Laterality Date   BREAST BIOPSY Left 2019   [facility]   BREAST LUMPECTOMY WITH AXILLARY LYMPH NODE BIOPSY Left 11/24/2017   Procedure: LEFT BREAST LUMPECTOMY WITH LEFT SENTINEL NODE LYMPH NODE BIOPSY;  Surgeon: Curvin Deward MOULD, MD;  Location: [facility] ORS;  Service: General;  Laterality: Left;   CESAREAN SECTION     EYE SURGERY      DETACHED RETINA 4/519   HYSTEROSCOPY WITH D & C N/A 03/14/2019   Procedure: DILATATION AND CURETTAGE LELDON MERL;  Surgeon: Leonce Garnette BIRCH, MD;  Location: [facility] OR[facility];  Service: Gynecology;  Laterality: N/A;   IR EMBO TUMOR ORGAN ISCHEMIA INFARCT INC GUIDE ROADMAPPING     Family History  Problem Relation Age of Onset   Kidney disease Mother    Heart disease Mother    Stroke Mother    Hepatitis C Mother    Dementia Father    Blindness Father    Kidney disease Sister    Schizophrenia Brother    Hypertension Brother    Diabetes Brother    Schizophrenia Paternal Aunt    Suicidality Paternal Aunt    Stroke Maternal Grandmother    Breast cancer Neg Hx    Social History   Socioeconomic History   Marital status: Divorced    Spouse name: Not on file   Number of children: 5   Years of education: Not on file   Highest education level: Some college, no degree  Occupational History   Not on file  Tobacco Use   Smoking status: Former    Current packs/day: 0.00    Average packs/day: 0.3 packs/day for 13.0 years (3.3 ttl pk-yrs)    Types: Cigars, Cigarettes    Start date: 05/04/2009    Quit date: 05/04/2022    Years since quitting: 1.7   Smokeless tobacco: Never   Tobacco comments:    Pt states 1 black and mild daily   Vaping Use   Vaping status: Never Used  Substance and Sexual Activity   Alcohol use: Yes    Alcohol/week: 12.0 standard drinks of alcohol    Types: 12 Shots of liquor per week    Comment: weekends   Drug use: No   Sexual activity: Not Currently    Birth control/protection: None  Other Topics Concern   Not on file  Social History Narrative   ** Merged History Encounter **       Social Drivers of Health   Financial Resource Strain: Low Risk  (02/15/2024)   Overall Financial Resource Strain (CARDIA)    Difficulty of Paying Living Expenses: Not hard at all  Food Insecurity: No Food Insecurity (02/15/2024)   Hunger Vital Sign    Worried About  Running Out of Food in the Last Year: Never true    Ran Out of Food in the Last Year: Never true  Transportation Needs: No Transportation Needs (02/15/2024)   PRAPARE - Administrator, Civil Service (Medical): No    Lack of Transportation (Non-Medical): No  Physical Activity: Sufficiently Active (02/15/2024)   Exercise Vital Sign    Days of Exercise per Week: 4 days    Minutes of Exercise per Session: 90 min  Stress: No Stress Concern Present (02/15/2024)   [employer] of Occupational Health - Occupational Stress Questionnaire    Feeling of Stress: Only a little  Social Connections:  Socially Isolated (02/15/2024)   Social Connection and Isolation Panel    Frequency of Communication with Friends and Family: More than three times a week    Frequency of Social Gatherings with Friends and Family: Not on file    Attends Religious Services: Never    Database administrator or Organizations: No    Attends Engineer, structural: Never    Marital Status: Divorced    Tobacco Counseling Counseling given: Not Answered Tobacco comments: Pt states 1 black and mild daily     Clinical Intake:  Pre-visit preparation completed: Yes  Pain : No/denies pain     BMI - recorded: 23.6 Nutritional Status: BMI of 19-24  Normal Nutritional Risks: None Diabetes: No  Lab Results  Component Value Date   HGBA1C 4.9 02/03/2022   HGBA1C 5.0 02/05/2021     How often do you need to have someone help you when you read instructions, pamphlets, or other written materials from your doctor or pharmacy?: 1 - Never  Interpreter Needed?: No  Information entered by :: JHONNIE DAS, LPN   Activities of Daily Living     02/15/2024    9:47 AM 04/21/2023    8:11 AM  In your present state of health, do you have any difficulty performing the following activities:  Hearing? 0 0  Vision? 0 1  Difficulty concentrating or making decisions? 1 1  Walking or climbing stairs? 1 1  Comment  ARTHRITIS RIGHT KNEE   Dressing or bathing? 0 0  Doing errands, shopping? 0 0  Preparing Food and eating ? N   Using the Toilet? N   In the past six months, have you accidently leaked urine? N   Do you have problems with loss of bowel control? N   Managing your Medications? N   Managing your Finances? N   Housekeeping or managing your Housekeeping? N     Patient Care Team: Bernardo Fend, DO as PCP - General (Internal Medicine) Patient, No Pcp Per (General Practice) Babara Call, MD as Consulting Physician (Hematology and Oncology[facility], [facility]  I have updated your Care Teams any recent Medical Services you may have received from other providers in the past year.     Assessment:   This is a routine wellness examination for Cindy Brewer.  Hearing/Vision screen Hearing Screening - Comments:: NO AIDS Vision Screening - Comments:: WEARS CONTACTS &amp; READERS- [email or web address]   Goals Addressed             This Visit's Progress    DIET - EAT MORE FRUITS AND VEGETABLES         Depression Screen     02/15/2024    9:39 AM 04/21/2023    8:16 AM 04/21/2023    8:11 AM 04/17/2023    8:59 AM 02/09/2023    9:44 AM 02/02/2023    1:11 PM 01/04/2023    1:01 PM  PHQ 2/9 Scores  PHQ - 2 Score 2 6 6 6 2 4 5   PHQ- 9 Score 2 11  18 9 17 16     Fall Risk     02/15/2024    9:45 AM 11/15/2023   11:31 AM 08/29/2023    2:44 PM 04/21/2023    8:11 AM 04/17/2023    8:59 AM  Fall Risk   Falls in the past year? 1 0 1 1 1   Number falls in past yr: 1 0 0 0 0  Injury with Fall? 1 0 1 1 1  Risk for fall due to : Impaired balance/gait No Fall Risks History of fall(s)  Impaired balance/gait  Risk for fall due to: Comment DIZZINESS      Follow up Falls evaluation completed;Falls prevention discussed Falls evaluation completed Falls evaluation completed  Falls prevention discussed;Education provided;Falls evaluation completed    MEDICARE RISK AT HOME:  Medicare Risk at Home Any stairs in or  around the home?: No If so, are there any without handrails?: No Home free of loose throw rugs in walkways, pet beds, electrical cords, etc?: Yes Adequate lighting in your home to reduce risk of falls?: Yes Life alert?: No Use of a cane, walker or w/c?: No Grab bars in the bathroom?: No Shower chair or bench in shower?: No Elevated toilet seat or a handicapped toilet?: No  TIMED UP AND GO:  Was the test performed?  No  Cognitive Function: 6CIT completed        02/15/2024    9:57 AM 02/09/2023    9:47 AM  6CIT Screen  What Year? 0 points 0 points  What month? 0 points 0 points  What time? 0 points 0 points  Count back from 20 0 points 0 points  Months in reverse 0 points 0 points  Repeat phrase 0 points 0 points  Total Score 0 points 0 points    Immunizations  There is no immunization history on file for this patient.  Screening Tests Health Maintenance  Topic Date Due   COVID-19 Vaccine (1) Never done   Hepatitis B Vaccines 19-59 Average Risk (1 of 3 - 19+ 3-dose series) Never done   Zoster Vaccines- Shingrix (1 of 2) Never done   Pneumococcal Vaccine: 50+ Years (1 of 1 - PCV) Never done   Cervical Cancer Screening (HPV/Pap Cotest)  01/02/2023   INFLUENZA VACCINE  02/02/2024   MAMMOGRAM  07/03/2024   Medicare Annual Wellness (AWV)  02/14/2025   Fecal DNA (Cologuard)  10/10/2026   Hepatitis C Screening  Completed   HIV Screening  Completed   HPV VACCINES  Aged Out   Meningococcal B Vaccine  Aged Out   DTaP/Tdap/Td  Discontinued    Health Maintenance  Health Maintenance Due  Topic Date Due   COVID-19 Vaccine (1) Never done   Hepatitis B Vaccines 19-59 Average Risk (1 of 3 - 19+ 3-dose series) Never done   Zoster Vaccines- Shingrix (1 of 2) Never done   Pneumococcal Vaccine: 50+ Years (1 of 1 - PCV) Never done   Cervical Cancer Screening (HPV/Pap Cotest)  01/02/2023   INFLUENZA VACCINE  02/02/2024   Health Maintenance Items Addressed: DECLINES ALL VACCINES;  COLOGUARD UP TO DATE; UP TO DATE ON MAMMOGRAM  Additional Screening:  Vision Screening: Recommended annual ophthalmology exams for early detection of glaucoma and other disorders of the eye. Would you like a referral to an eye doctor? No    Dental Screening: Recommended annual dental exams for proper oral hygiene  Community Resource Referral / Chronic Care Management: CRR required this visit?  No   CCM required this visit?  No   Plan:    I have personally reviewed and noted the following in the patient's chart:   Medical and social history Use of alcohol, tobacco or illicit drugs  Current medications and supplements including opioid prescriptions. Patient is not currently taking opioid prescriptions. Functional ability and status Nutritional status Physical activity Advanced directives List of other physicians Hospitalizations, surgeries, and ER visits in previous 12 months Vitals Screenings to include cognitive,  depression, and falls Referrals and appointments  In addition, I have reviewed and discussed with patient certain preventive protocols, quality metrics, and best practice recommendations. A written personalized care plan for preventive services as well as general preventive health recommendations were provided to patient.   Jhonnie GORMAN Das, LPN   [id number]/85/7974   After Visit Summary: (MyChart) Due to this being a telephonic visit, the after visit summary with patients personalized plan was offered to patient via MyChart   Notes: Nothing significant to report at this time.

## 2024-02-29 ENCOUNTER — Encounter: Payer: Self-pay | Admitting: Oncology

## 2024-02-29 ENCOUNTER — Ambulatory Visit: Payer: Medicare HMO | Admitting: Adult Health

## 2024-02-29 ENCOUNTER — Encounter: Payer: Self-pay | Admitting: Adult Health

## 2024-02-29 VITALS — BP 126/94 | HR 80 | Ht 66.0 in | Wt 145.2 lb

## 2024-02-29 DIAGNOSIS — G44209 Tension-type headache, unspecified, not intractable: Secondary | ICD-10-CM | POA: Diagnosis not present

## 2024-02-29 DIAGNOSIS — G43909 Migraine, unspecified, not intractable, without status migrainosus: Secondary | ICD-10-CM

## 2024-02-29 MED ORDER — TOPIRAMATE 25 MG PO TABS: 25.0000 mg | ORAL_TABLET | Freq: Two times a day (BID) | ORAL | 11 refills | Status: AC

## 2024-02-29 NOTE — Patient Instructions (Addendum)
 Your Plan:  Start topamax  25mg  twice daily for headache prevention - please call after 2 weeks if headaches persist and we can consider further dosage adjustment   Continue to ensure adequate water intake and avoid known migraine triggers   Okay to use over the counter pain relievers as needed but do not use more than 2-3 times per week due to risk of rebound headache (worsening headache)   Please have your eye doctor send us  your recent exam for further review       Follow up in 6 months or call earlier if needed      Thank you for coming to see us  at [facility]. I hope we have been able to provide you high quality care today.  You may receive a patient satisfaction survey over the next few weeks. We would appreciate your feedback and comments so that we may continue to improve ourselves and the health of our patients.     Topiramate  Tablets What is this medication? TOPIRAMATE  (toe PYRE a mate) prevents and controls seizures in people with epilepsy. It may also be used to prevent migraine headaches. It works by calming overactive nerves in your body. This medicine may be used for other purposes; ask your health care provider or pharmacist if you have questions. COMMON BRAND NAME(S): Topamax , Topiragen What should I tell my care team before I take this medication? They need to know if you have any of these conditions: Bleeding disorder Kidney disease Lung disease Suicidal thoughts, plans, or attempt by you or a family member An unusual or allergic reaction to topiramate , other medications, foods, dyes, or preservatives Pregnant or trying to get pregnant Breast-feeding How should I use this medication? Take this medication by mouth with water. Take it as directed on the prescription label at the same time every day. Do not cut, crush or chew this medicine. Swallow the tablets whole. You can take it with or without food. If it upsets your stomach, take it  with food. Keep taking it unless your care team tells you to stop. A special MedGuide will be given to you by the pharmacist with each prescription and refill. Be sure to read this information carefully each time. Talk to your care team about the use of this medication in children. While it may be prescribed for children as young as 2 years for selected conditions, precautions do apply. Overdosage: If you think you have taken too much of this medicine contact a poison control center or emergency room at once. NOTE: This medicine is only for you. Do not share this medicine with others. What if I miss a dose? If you miss a dose, take it as soon as you can unless it is within 6 hours of the next dose. If it is within 6 hours of the next dose, skip the missed dose. Take the next dose at the normal time. Do not take double or extra doses. What may interact with this medication? Acetazolamide Alcohol Antihistamines for allergy, cough, and cold Aspirin  and aspirin -like medications Atropine Certain medications for anxiety or sleep Certain medications for bladder problems, such as oxybutynin, tolterodine Certain medications for depression, such as amitriptyline, fluoxetine, sertraline Certain medications for Parkinson disease, such as benztropine, trihexyphenidyl Certain medications for seizures, such as carbamazepine, lamotrigine, phenobarbital, phenytoin, primidone, valproic acid, zonisamide Certain medications for stomach problems, such as dicyclomine, hyoscyamine Certain medications for travel sickness, such as scopolamine Certain medications that treat or prevent blood clots, such as warfarin,  enoxaparin , dalteparin, apixaban, dabigatran, rivaroxaban Digoxin Diltiazem Estrogen and progestin hormones General anesthetics, such as halothane, isoflurane, methoxyflurane, propofol  Glyburide Hydrochlorothiazide  Ipratropium Lithium Medications that relax muscles Metformin NSAIDs, medications for  pain and inflammation, such as ibuprofen  or naproxen  Opioid medications for pain Phenothiazines, such as chlorpromazine, mesoridazine, prochlorperazine, thioridazine Pioglitazone This list may not describe all possible interactions. Give your health care provider a list of all the medicines, herbs, non-prescription drugs, or dietary supplements you use. Also tell them if you smoke, drink alcohol, or use illegal drugs. Some items may interact with your medicine. What should I watch for while using this medication? Visit your care team for regular checks on your progress. Tell your care team if your symptoms do not start to get better or if they get worse. Do not suddenly stop taking this medication. You may develop a severe reaction. Your care team will tell you how much medication to take. If your care team wants you to stop the medication, the dose may be slowly lowered over time to avoid any side effects. Wear a medical ID bracelet or chain. Carry a card that describes your condition. List the medications and doses you take on the card. This medication may affect your coordination, reaction time, or judgment. Do not drive or operate machinery until you know how this medication affects you. Sit up or stand slowly to reduce the risk of dizzy or fainting spells. Drinking alcohol with this medication can increase the risk of these side effects. This medication may cause serious skin reactions. They can happen weeks to months after starting the medication. Contact your care team right away if you notice fevers or flu-like symptoms with a rash. The rash may be red or purple and then turn into blisters or peeling of the skin. You may also notice a red rash with swelling of the face, lips, or lymph nodes in your neck or under your arms. This medication may cause thoughts of suicide or depression. This includes sudden changes in mood, behaviors, or thoughts. These changes can happen at any time but are more  common in the beginning of treatment or after a change in dose. Call your care team right away if you experience these thoughts or worsening depression. This medication may slow your child's growth if it is taken for a long time at high doses. Your child's care team will monitor your child's growth. Using this medication for a long time may weaken your bones. The risk of bone fractures may be increased. Talk to your care team about your bone health. Discuss this medication with your care team if you may be pregnant. Serious birth defects can occur if you take this medication during pregnancy. There are benefits and risks to taking medications during pregnancy. Your care team can help you find the option that works for you. Contraception is recommended while taking this medication. Estrogen and progestin hormones may not work as well while you are taking this medication. Your care team can help you find the option that works for you. Talk to your care team before breastfeeding. Changes to your treatment plan may be needed. What side effects may I notice from receiving this medication? Side effects that you should report to your care team as soon as possible: Allergic reactions--skin rash, itching, hives, swelling of the face, lips, tongue, or throat High acid level--trouble breathing, unusual weakness or fatigue, confusion, headache, fast or irregular heartbeat, nausea, vomiting High ammonia level--unusual weakness or fatigue, confusion, loss of appetite,  nausea, vomiting, seizures Fever that does not go away, decrease in sweat Kidney stones--blood in the urine, pain or trouble passing urine, pain in the lower back or sides Redness, blistering, peeling or loosening of the skin, including inside the mouth Sudden eye pain or change in vision such as blurry vision, seeing halos around lights, vision loss Thoughts of suicide or self-harm, worsening mood, feelings of depression Side effects that usually do  not require medical attention (report to your care team if they continue or are bothersome): Burning or tingling sensation in hands or feet Difficulty with paying attention, memory, or speech Dizziness Drowsiness Fatigue Loss of appetite with weight loss Slow or sluggish movements of the body This list may not describe all possible side effects. Call your doctor for medical advice about side effects. You may report side effects to FDA at 1-800-FDA-1088. Where should I keep my medication? Keep out of the reach of children and pets. Store between 15 and 30 degrees C (59 and 86 degrees F). Protect from moisture. Keep the container tightly closed. Get rid of any unused medication after the expiration date. To get rid of medications that are no longer needed or have expired: Take the medication to a medication take-back program. Check with your pharmacy or law enforcement to find a location. If you cannot return the medication, check the label or package insert to see if the medication should be thrown out in the garbage or flushed down the toilet. If you are not sure, ask your care team. If it is safe to put it in the trash, empty the medication out of the container. Mix the medication with cat litter, dirt, coffee grounds, or other unwanted substance. Seal the mixture in a bag or container. Put it in the trash. NOTE: This sheet is a summary. It may not cover all possible information. If you have questions about this medicine, talk to your doctor, pharmacist, or health care provider.  2024 Elsevier/Gold Standard (2021-11-11 00:00:00)

## 2024-02-29 NOTE — Progress Notes (Signed)
 [facility] [address] [place]. [place] [address] [phone]       OFFICE FOLLOW UP NOTE  Cindy Brewer Date of Birth:  05/08/1971 Medical Record Number:  [id number]    Primary neurologist: Dr. Buck Reason for visit: Chronic headaches    SUBJECTIVE:  CHIEF COMPLAINT:  Chief Complaint  Patient presents with   Follow-up    RM 8, Pt alone, here to f/u for headaches. Pt states the HA and dizziness is about the same since last time. States PCP gave script for dizziness. Pt states had a fall last month d/t dizziness.    Follow-up visit:  Prior visit: 08/01/2023 with Dr. Buck  Brief HPI:   Cindy Brewer is a 53 y.o. female with PMH of OSA, anemia, arthritis, breast cancer s/p lumpectomy, reflux disease, depression and anxiety who is followed for headache management.  She is initially evaluated by Dr. Buck 04/2023 for recurrent headaches over a several week period.  [facility] 04/2023 showed partially empty sella. 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  She was advised to proceed with formal eye examination to rule out papilledema.  Suspected combination type headaches for multiple etiologies including caffeine overuse, poor sleep, untreated OSA, elevated BP and stress.     Interval history:  Patient returns for follow-up visit. Reports continued daily headaches, present on either left or right side, can be debilitating associated with photophobia and phonophobia.  Usually triggered by increased physical activity such as while working (works at [employer]) or working out.  She tries to ensure adequate hydration as she has found this can be a trigger.  She does report ophthalmology exam at [facility] shortly after prior visit, notes requested to be sent to this office but unable to see any information in epic.  She denies overuse  of OTC analgesics.  She has been experiencing episodes of dizziness over the past couple of months and currently working with PCP who feels more blood pressure related.      ROS:   14 system review of systems performed and negative with exception of those listed in HPI  PMH:  Past Medical History:  Diagnosis Date   Anemia    Anxiety    Arthritis    right knee    Cancer (HCC) 2019   BREAST- Left   Depression    Fibroid    GERD (gastroesophageal reflux disease)    Headache     PSH:  Past Surgical History:  Procedure Laterality Date   BREAST BIOPSY Left 2019   IDC   BREAST LUMPECTOMY WITH AXILLARY LYMPH NODE BIOPSY Left 11/24/2017   Procedure: LEFT BREAST LUMPECTOMY WITH LEFT SENTINEL NODE LYMPH NODE BIOPSY;  Surgeon: Curvin Deward MOULD, MD;  Location: [facility] ORS;  Service: General;  Laterality: Left;   CESAREAN SECTION     EYE SURGERY     DETACHED RETINA 4/519   HYSTEROSCOPY WITH D & C N/A 03/14/2019   Procedure: DILATATION AND CURETTAGE LELDON MERL;  Surgeon: Leonce Garnette BIRCH, MD;  Location: [facility] ORS;  Service: Gynecology;  Laterality: N/A;   IR EMBO TUMOR ORGAN ISCHEMIA INFARCT INC GUIDE ROADMAPPING      Social History:  Social History   Socioeconomic History   Marital status: Divorced    Spouse name: Not on file   Number of children: 5   Years of education: Not on file  Highest education level: Some college, no degree  Occupational History   Not on file  Tobacco Use   Smoking status: Former    Current packs/day: 0.00    Average packs/day: 0.3 packs/day for 13.0 years (3.3 ttl pk-yrs)    Types: Cigars, Cigarettes    Start date: 05/04/2009    Quit date: 05/04/2022    Years since quitting: 1.8   Smokeless tobacco: Never   Tobacco comments:    Pt states 1 black and mild daily   Vaping Use   Vaping status: Never Used  Substance and Sexual Activity   Alcohol use: Yes    Alcohol/week: 9.0 standard drinks of alcohol    Types: 9 Shots of liquor per  week    Comment: usually 3 shots on nights not working   Drug use: No   Sexual activity: Not Currently    Birth control/protection: None  Other Topics Concern   Not on file  Social History Narrative   ** Merged History Encounter **       Social Drivers of Health   Financial Resource Strain: Low Risk  (02/15/2024)   Overall Financial Resource Strain (CARDIA)    Difficulty of Paying Living Expenses: Not hard at all  Food Insecurity: No Food Insecurity (02/15/2024)   Hunger Vital Sign    Worried About Running Out of Food in the Last Year: Never true    Ran Out of Food in the Last Year: Never true  Transportation Needs: No Transportation Needs (02/15/2024)   PRAPARE - Administrator, Civil Service (Medical): No    Lack of Transportation (Non-Medical): No  Physical Activity: Sufficiently Active (02/15/2024)   Exercise Vital Sign    Days of Exercise per Week: 4 days    Minutes of Exercise per Session: 90 min  Stress: No Stress Concern Present (02/15/2024)   Cindy Brewer of Occupational Health - Occupational Stress Questionnaire    Feeling of Stress: Only a little  Social Connections: Socially Isolated (02/15/2024)   Social Connection and Isolation Panel    Frequency of Communication with Friends and Family: More than three times a week    Frequency of Social Gatherings with Friends and Family: Not on file    Attends Religious Services: Never    Database administrator or Organizations: No    Attends Banker Meetings: Never    Marital Status: Divorced  Catering manager Violence: Not At Risk (02/15/2024)   Humiliation, Afraid, Rape, and Kick questionnaire    Fear of Current or Ex-Partner: No    Emotionally Abused: No    Physically Abused: No    Sexually Abused: No    Family History:  Family History  Problem Relation Age of Onset   Kidney disease Mother    Heart disease Mother    Stroke Mother    Hepatitis C Mother    Dementia Father    Blindness  Father    Kidney disease Sister    Schizophrenia Brother    Hypertension Brother    Diabetes Brother    Schizophrenia Paternal Aunt    Suicidality Paternal Aunt    Stroke Maternal Grandmother    Breast cancer Neg Hx     Medications:   Current Outpatient Medications on File Prior to Visit  Medication Sig Dispense Refill   Acetaminophen  (TYLENOL  PO) Take by mouth as needed.     amLODipine  (NORVASC ) 5 MG tablet Take 1 tablet (5 mg total) by mouth daily. 90 tablet 1  cyanocobalamin  (VITAMIN B12) 1000 MCG tablet Take 1 tablet (1,000 mcg total) by mouth daily. (Patient taking differently: Take 1,000 mcg by mouth daily. TAKES VIT B12 SHOTS EVERY 3 WEEKS) 90 tablet 1   escitalopram  (LEXAPRO ) 20 MG tablet Take 1 tablet (20 mg total) by mouth daily. 90 tablet 1   hydrochlorothiazide  (HYDRODIURIL ) 25 MG tablet Take 1 tablet (25 mg total) by mouth daily. 90 tablet 1   meclizine  (ANTIVERT ) 12.5 MG tablet Take 1 tablet (12.5 mg total) by mouth 2 (two) times daily as needed for dizziness. 30 tablet 0   metoprolol  tartrate (LOPRESSOR ) 25 MG tablet Take 1 tablet (25 mg total) by mouth 2 (two) times daily. 180 tablet 1   Multiple Vitamin (MULTIVITAMIN WITH MINERALS) TABS tablet Take 1 tablet by mouth daily.     omeprazole  (PRILOSEC) 20 MG capsule Take 1 capsule (20 mg total) by mouth daily as needed (acid reflux).     Vitamin D , Ergocalciferol , (DRISDOL ) 1.25 MG (50000 UNIT) CAPS capsule Take 1 capsule (50,000 Units total) by mouth every 7 (seven) days. 12 capsule 0   No current facility-administered medications on file prior to visit.    Allergies:   Allergies  Allergen Reactions   Demerol [Meperidine] Anaphylaxis    Pt does not tolerate pain killers.   Oxycodone Anaphylaxis   Sulfa Antibiotics Anaphylaxis   Percocet [Oxycodone-Acetaminophen ] Swelling      OBJECTIVE:  Physical Exam  Vitals:   02/29/24 1105  BP: (!) 126/94  Pulse: 80  Weight: 145 lb 3.2 oz (65.9 kg)  Height: 5' 6  (1.676 m)   Body mass index is 23.44 kg/m. No results found.   General: well developed, well nourished, very pleasant middle-age female, seated, in no evident distress  Neurologic Exam Mental Status: Awake and fully alert. Oriented to place and time. Recent and remote memory intact. Attention span, concentration and fund of knowledge appropriate. Mood and affect appropriate.  Cranial Nerves: Pupils equal, briskly reactive to light. Extraocular movements full without nystagmus. Visual fields full to confrontation. Hearing intact. Facial sensation intact. Face, tongue, palate moves normally and symmetrically.  Motor: Normal bulk and tone. Normal strength in all tested extremity muscles Gait and Station: Arises from chair without difficulty. Stance is normal. Gait demonstrates normal stride length and balance without use of AD.          ASSESSMENT/PLAN: Raahi Korber is a 53 y.o. year old female      Chronic headaches:  Suspect mixed tension and migrainous Recommend starting topiramate  25 mg twice daily, consider increasing after 2 weeks if headaches persist Okay for occasional use of acetaminophen  or ibuprofen  as needed but advised to limit to no more than 2-3x per week to prevent rebound headaches She will contact her ophthalmologist to request prior exam in 07/2023 be faxed to office for review and ensure no evidence of papilledema CTH partially empty sella      Follow up in 6 months or call earlier if needed   CC:  PCP: Bernardo Fend, DO    I personally spent a total of 30 minutes in the care of the patient today including preparing to see the patient, getting/reviewing separately obtained history, performing a medically appropriate exam/evaluation, counseling and educating, placing orders, and documenting clinical information in the EHR.  Harlene Bogaert, AGNP-BC  [facility] [address] [place], [place]  [address]  Phone [phone] Fax [phone] Note: This document was prepared with digital dictation and possible smart phrase technology. Any  transcriptional errors that result from this process are unintentional.

## 2024-03-05 ENCOUNTER — Inpatient Hospital Stay: Attending: Oncology

## 2024-03-27 ENCOUNTER — Other Ambulatory Visit: Payer: Self-pay | Admitting: Internal Medicine

## 2024-03-27 DIAGNOSIS — I10 Essential (primary) hypertension: Secondary | ICD-10-CM

## 2024-03-28 NOTE — Telephone Encounter (Signed)
 Requested Prescriptions  Pending Prescriptions Disp Refills   metoprolol  tartrate (LOPRESSOR ) 25 MG tablet [Pharmacy Med Name: METOPROLOL  TARTRATE 25 MG TAB] 180 tablet 0    Sig: TAKE 1 TABLET BY MOUTH TWICE A DAY     Cardiovascular:  Beta Blockers Failed - 03/28/2024  1:05 PM      Failed - Last BP in normal range    BP Readings from Last 1 Encounters:  02/29/24 (!) 126/94         Passed - Last Heart Rate in normal range    Pulse Readings from Last 1 Encounters:  02/29/24 80         Passed - Valid encounter within last 6 months    Recent Outpatient Visits           1 month ago Moderate episode of recurrent major depressive disorder [facility])   [facility] Bernardo Fend, DO   2 months ago Dizziness   [facility] Bernardo Fend, DO   4 months ago Diarrhea, unspecified type   [facility] Bernardo Fend, DO   6 months ago Primary hypertension   [facility] Bernardo Fend, DO   7 months ago Lip laceration, initial encounter   [facility] Bernardo Fend, DO       Future Appointments             In 4 months Bernardo Fend, DO [facility] [place]

## 2024-03-29 DIAGNOSIS — H0011 Chalazion right upper eyelid: Secondary | ICD-10-CM | POA: Diagnosis not present

## 2024-04-05 ENCOUNTER — Inpatient Hospital Stay: Attending: Oncology

## 2024-04-05 DIAGNOSIS — E538 Deficiency of other specified B group vitamins: Secondary | ICD-10-CM | POA: Insufficient documentation

## 2024-04-05 DIAGNOSIS — C50812 Malignant neoplasm of overlapping sites of left female breast: Secondary | ICD-10-CM

## 2024-04-05 MED ORDER — CYANOCOBALAMIN 1000 MCG/ML IJ SOLN
1000.0000 ug | Freq: Once | INTRAMUSCULAR | Status: AC
  Administered 2024-04-05: 1000 ug via INTRAMUSCULAR
  Filled 2024-04-05: qty 1

## 2024-04-11 NOTE — Progress Notes (Signed)
 Cindy Brewer                                          MRN: [id number]   04/11/2024   The [facility] Quality Team Specialist reviewed this patient medical record for the purposes of chart review for care gap closure. The following were reviewed: chart review for care gap closure-controlling blood pressure.    [facility] Quality Team

## 2024-04-20 ENCOUNTER — Other Ambulatory Visit: Payer: Self-pay | Admitting: Internal Medicine

## 2024-04-20 DIAGNOSIS — I10 Essential (primary) hypertension: Secondary | ICD-10-CM

## 2024-04-23 ENCOUNTER — Ambulatory Visit: Admitting: Plastic Surgery

## 2024-04-23 NOTE — Telephone Encounter (Signed)
 Requested by interface surescripts. Future visit in 3 months.  Requested Prescriptions  Pending Prescriptions Disp Refills   hydrochlorothiazide  (HYDRODIURIL ) 25 MG tablet [Pharmacy Med Name: HYDROCHLOROTHIAZIDE  25 MG TAB] 90 tablet 0    Sig: TAKE 1 TABLET (25 MG TOTAL) BY MOUTH DAILY.     Cardiovascular: Diuretics - Thiazide Failed - 04/23/2024 10:02 AM      Failed - Last BP in normal range    BP Readings from Last 1 Encounters:  02/29/24 (!) 126/94         Passed - Cr in normal range and within 180 days    Creat  Date Value Ref Range Status  02/09/2024 0.86 0.50 - 1.03 mg/dL Final         Passed - K in normal range and within 180 days    Potassium  Date Value Ref Range Status  02/09/2024 5.1 3.5 - 5.3 mmol/L Final         Passed - Na in normal range and within 180 days    Sodium  Date Value Ref Range Status  02/09/2024 141 135 - 146 mmol/L Final         Passed - Valid encounter within last 6 months    Recent Outpatient Visits           2 months ago Moderate episode of recurrent major depressive disorder [facility])   [facility] Bernardo Fend, DO   3 months ago Dizziness   [facility] Bernardo Fend, DO   5 months ago Diarrhea, unspecified type   [facility] Bernardo Fend, DO   7 months ago Primary hypertension   [facility] Bernardo Fend, DO   7 months ago Lip laceration, initial encounter   [facility] Bernardo Fend, DO       Future Appointments             In 3 months Bernardo Fend, DO [facility] [place]

## 2024-05-07 ENCOUNTER — Inpatient Hospital Stay: Attending: Oncology

## 2024-05-07 DIAGNOSIS — E538 Deficiency of other specified B group vitamins: Secondary | ICD-10-CM | POA: Diagnosis not present

## 2024-05-07 DIAGNOSIS — C50812 Malignant neoplasm of overlapping sites of left female breast: Secondary | ICD-10-CM

## 2024-05-07 MED ORDER — CYANOCOBALAMIN 1000 MCG/ML IJ SOLN
1000.0000 ug | Freq: Once | INTRAMUSCULAR | Status: AC
  Administered 2024-05-07: 1000 ug via INTRAMUSCULAR
  Filled 2024-05-07: qty 1

## 2024-05-14 ENCOUNTER — Encounter: Payer: Self-pay | Admitting: Plastic Surgery

## 2024-05-14 ENCOUNTER — Ambulatory Visit (INDEPENDENT_AMBULATORY_CARE_PROVIDER_SITE_OTHER): Admitting: Plastic Surgery

## 2024-05-14 DIAGNOSIS — N6489 Other specified disorders of breast: Secondary | ICD-10-CM

## 2024-05-14 DIAGNOSIS — Z9012 Acquired absence of left breast and nipple: Secondary | ICD-10-CM | POA: Diagnosis not present

## 2024-05-14 DIAGNOSIS — Z853 Personal history of malignant neoplasm of breast: Secondary | ICD-10-CM | POA: Diagnosis not present

## 2024-05-14 DIAGNOSIS — N651 Disproportion of reconstructed breast: Secondary | ICD-10-CM

## 2024-05-14 NOTE — Progress Notes (Signed)
   Subjective:    Patient ID: Cindy Brewer, female    DOB: 08/26/1970, 53 y.o.   MRN: [id number]  The patient is a 53 year old female joining me by phone.  She is at home and I am at the office.  She had left breast cancer in 2019 it was estrogen and progesterone negative and HER2 negative.  She had a left partial mastectomy.  She has significant asymmetry with the right breast being 1-2 breast sizes larger.  She has lost about 20 pounds in the past 6 months.  She is now 5 feet 6 inches tall and weighs 143 pounds.  He is planning on losing a little bit more weight so she would like to wait a few months before having surgery.     Review of Systems  Constitutional:  Positive for activity change.  Eyes: Negative.   Respiratory: Negative.    Cardiovascular: Negative.   Gastrointestinal: Negative.   Genitourinary: Negative.        Objective:   Physical Exam      Assessment & Plan:     ICD-10-CM   1. Postoperative breast asymmetry  N64.89       I connected with  Cindy Brewer on 05/14/24 by phone and verified that I am speaking with the correct person using two identifiers.  We spent 5 minutes in discussion.  The patient is going to come and see us  in January or February for follow-up.  At that time we will discuss breast reduction.   I discussed the limitations of evaluation and management by telemedicine. The patient expressed understanding and agreed to proceed.

## 2024-05-16 ENCOUNTER — Encounter: Payer: Self-pay | Admitting: Internal Medicine

## 2024-05-16 ENCOUNTER — Ambulatory Visit (INDEPENDENT_AMBULATORY_CARE_PROVIDER_SITE_OTHER): Admitting: Internal Medicine

## 2024-05-16 ENCOUNTER — Other Ambulatory Visit: Payer: Self-pay

## 2024-05-16 ENCOUNTER — Ambulatory Visit: Admitting: Internal Medicine

## 2024-05-16 VITALS — BP 124/78 | HR 88 | Temp 98.6°F | Resp 16 | Ht 66.0 in | Wt 142.6 lb

## 2024-05-16 DIAGNOSIS — Z853 Personal history of malignant neoplasm of breast: Secondary | ICD-10-CM | POA: Diagnosis not present

## 2024-05-16 DIAGNOSIS — Z780 Asymptomatic menopausal state: Secondary | ICD-10-CM

## 2024-05-16 DIAGNOSIS — R42 Dizziness and giddiness: Secondary | ICD-10-CM

## 2024-05-16 DIAGNOSIS — R7989 Other specified abnormal findings of blood chemistry: Secondary | ICD-10-CM | POA: Diagnosis not present

## 2024-05-16 MED ORDER — MECLIZINE HCL 25 MG PO TABS: 25.0000 mg | ORAL_TABLET | Freq: Two times a day (BID) | ORAL | 0 refills | Status: AC | PRN

## 2024-05-16 NOTE — Progress Notes (Signed)
 Established Patient Office Visit  Subjective    Patient ID: Cindy Brewer, female    DOB: Jan 24, 1971  Age: 53 y.o. MRN: [id number]  CC:  Chief Complaint  Patient presents with   Medical Management of Chronic Issues    6 month recheck    HPI Cindy Brewer presents to follow up.   Discussed the use of AI scribe software for clinical note transcription with the patient, who gave verbal consent to proceed.  History of Present Illness Cindy Brewer is a 53 year old female who presents with dizziness and headaches.  She experiences persistent dizziness with varying severity, impacting her work. Meclizine  initially provided relief but now worsens the dizziness at the current dosage. She also has low-grade, intermittent headaches that are not localized. Topamax  has effectively reduced her frequency. She is post-menopausal, with symptoms like changes in body shape, muscle loss, and hot flashes, which have subsided. Her last menstrual period was at age 33 after a myomectomy for fibroids. She engages in regular physical activity, including weight training and walking her dog, but struggles to build muscle.   Abnormal Thyroid  Labs: -Last TSH 0.27, T4 6.1 8/25, Trab negative 3/25  Hx of Breast Cancer, multifocal triple negative Stage IIB: -Following with Oncology, Dr. Babara.  -S/p left lumpectomy and sentinel lymph node biopsy in 2019 -Patient opted out of chemotherapy -Planning for breast MRI for screening  History of Iron  Deficiency Anemia secondary to uterine fibroids: -Received iron  transfusions in the past but since she had polypectomy and myomectomy her hemoglobin has been stable, however MCV elevated on last set of labs.  -Started on Vitamin B12 and folate supplements   Hypertension: -Medications: Amlodipine  5 mg, HCTZ 25 mg, Metoprolol  25 mg BID  -Failed Meds: Losartan makes her heart race  -Checking BP at home (average): not checking currently but does have  a cuff -Denies any SOB, CP, vision changes, LE edema or symptoms of hypotension.   HLD: -Medications: Nothing -Last lipid panel: Lipid Panel     Component Value Date/Time   CHOL 197 09/26/2023 1129   TRIG 203 (H) 09/26/2023 1129   HDL 38 (L) 09/26/2023 1129   CHOLHDL 5.2 (H) 09/26/2023 1129   VLDL 38 02/17/2021 1921   LDLCALC 127 (H) 09/26/2023 1129   The 10-year ASCVD risk score (Arnett DK, et al., 2019) is: 5.2%   Values used to calculate the score:     Age: 33 years     Clincally relevant sex: Female     Is Non-Hispanic African American: Yes     Diabetic: No     Tobacco smoker: No     Systolic Blood Pressure: 124 mmHg     Is BP treated: Yes     HDL Cholesterol: 38 mg/dL     Total Cholesterol: 197 mg/dL  Migraines -Following with Neurology -Currently on Topamax  25 mg BID for prevention   MDD: -Mood status: stable -Current treatment: Lexapro  20 mg     05/16/2024   10:30 AM 02/15/2024    9:39 AM 04/21/2023    8:16 AM 04/21/2023    8:11 AM 04/17/2023    8:59 AM  Depression screen PHQ 2/9  Decreased Interest 1 1 3 3 3   Down, Depressed, Hopeless 1 1 3 3 3   PHQ - 2 Score 2 2 6 6 6   Altered sleeping 3 0 1  3  Tired, decreased energy 0 0 3  3  Change in appetite 0 0 0  3  Feeling bad or  failure about yourself  0 0 0  0  Trouble concentrating 0 0 1  3  Moving slowly or fidgety/restless 0 0 0  0  Suicidal thoughts 0 0 0  0  PHQ-9 Score 5 2  11   18    Difficult doing work/chores Not difficult at all Not difficult at all Somewhat difficult  Very difficult     Data saved with a previous flowsheet row definition   Health Maintenance: -Blood work UTD -Colon cancer screening: Cologuard negative 4/25 -Had first Shingles vaccine  Outpatient Encounter Medications as of 05/16/2024  Medication Sig   Acetaminophen  (TYLENOL  PO) Take by mouth as needed.   amLODipine  (NORVASC ) 5 MG tablet Take 1 tablet (5 mg total) by mouth daily.   cyanocobalamin  (VITAMIN B12) 1000 MCG  tablet Take 1 tablet (1,000 mcg total) by mouth daily. (Patient taking differently: Take 1,000 mcg by mouth daily. TAKES VIT B12 SHOTS EVERY 3 WEEKS)   escitalopram  (LEXAPRO ) 20 MG tablet Take 1 tablet (20 mg total) by mouth daily.   hydrochlorothiazide  (HYDRODIURIL ) 25 MG tablet TAKE 1 TABLET (25 MG TOTAL) BY MOUTH DAILY.   meclizine  (ANTIVERT ) 12.5 MG tablet Take 1 tablet (12.5 mg total) by mouth 2 (two) times daily as needed for dizziness.   metoprolol  tartrate (LOPRESSOR ) 25 MG tablet TAKE 1 TABLET BY MOUTH TWICE A DAY   Multiple Vitamin (MULTIVITAMIN WITH MINERALS) TABS tablet Take 1 tablet by mouth daily.   omeprazole  (PRILOSEC) 20 MG capsule Take 1 capsule (20 mg total) by mouth daily as needed (acid reflux).   topiramate  (TOPAMAX ) 25 MG tablet Take 1 tablet (25 mg total) by mouth 2 (two) times daily.   Vitamin D , Ergocalciferol , (DRISDOL ) 1.25 MG (50000 UNIT) CAPS capsule Take 1 capsule (50,000 Units total) by mouth every 7 (seven) days. (Patient not taking: Reported on 05/16/2024)   No facility-administered encounter medications on file as of 05/16/2024.    Past Medical History:  Diagnosis Date   Anemia    Anxiety    Arthritis    right knee    Cancer (HCC) 2019   BREAST- Left   Depression    Fibroid    GERD (gastroesophageal reflux disease)    Headache     Past Surgical History:  Procedure Laterality Date   BREAST BIOPSY Left 2019   ID[facility]   BREAST LUMPECTOMY WITH AXILLARY LYMPH NODE BIOPSY Left 11/24/2017   Procedure: LEFT BREAST LUMPECTOMY WITH LEFT SENTINEL NODE LYMPH NODE BIOPSY;  Surgeon: Curvin Deward MOULD, MD;  Location: [facility] ORS;  Service: General;  Laterality: Left;   CESAREAN SECTION     EYE SURGERY     DETACHED RETINA 4/519   HYSTEROSCOPY WITH D & C N/A 03/14/2019   Procedure: DILATATION AND CURETTAGE LELDON MERL;  Surgeon: Leonce Garnette BIRCH, MD;  Location: [facility] OR[facility];  Service: Gynecology;  Laterality: N/A;   IR EMBO TUMOR ORGAN [employer]  GUIDE ROADMAPPING      Family History  Problem Relation Age of Onset   Kidney disease Mother    Heart disease Mother    Stroke Mother    Hepatitis C Mother    Dementia Father    Blindness Father    Kidney disease Sister    Schizophrenia Brother    Hypertension Brother    Diabetes Brother    Schizophrenia Paternal Aunt    Suicidality Paternal Aunt    Stroke Maternal Grandmother    Breast cancer Neg Hx     Social History  Socioeconomic History   Marital status: Divorced    Spouse name: Not on file   Number of children: 5   Years of education: Not on file   Highest education level: Some college, no degree  Occupational History   Not on file  Tobacco Use   Smoking status: Former    Current packs/day: 0.00    Average packs/day: 0.3 packs/day for 13.0 years (3.3 ttl pk-yrs)    Types: Cigars, Cigarettes    Start date: 05/04/2009    Quit date: 05/04/2022    Years since quitting: 2.0   Smokeless tobacco: Never   Tobacco comments:    Pt states 1 black and mild daily   Vaping Use   Vaping status: Never Used  Substance and Sexual Activity   Alcohol use: Yes    Alcohol/week: 9.0 standard drinks of alcohol    Types: 9 Shots of liquor per week    Comment: usually 3 shots on nights not working   Drug use: No   Sexual activity: Not Currently    Birth control/protection: None  Other Topics Concern   Not on file  Social History Narrative   ** Merged History Encounter **       Social Drivers of Health   Financial Resource Strain: Low Risk  (02/15/2024)   Overall Financial Resource Strain (CARDIA)    Difficulty of Paying Living Expenses: Not hard at all  Food Insecurity: No Food Insecurity (02/15/2024)   Hunger Vital Sign    Worried About Running Out of Food in the Last Year: Never true    Ran Out of Food in the Last Year: Never true  Transportation Needs: No Transportation Needs (02/15/2024)   PRAPARE - Administrator, Civil Service (Medical): No    Lack of  Transportation (Non-Medical): No  Physical Activity: Sufficiently Active (02/15/2024)   Exercise Vital Sign    Days of Exercise per Week: 4 days    Minutes of Exercise per Session: 90 min  Stress: No Stress Concern Present (02/15/2024)   [employer] of Occupational Health - Occupational Stress Questionnaire    Feeling of Stress: Only a little  Social Connections: Socially Isolated (02/15/2024)   Social Connection and Isolation Panel    Frequency of Communication with Friends and Family: More than three times a week    Frequency of Social Gatherings with Friends and Family: Not on file    Attends Religious Services: Never    Database Administrator or Organizations: No    Attends Banker Meetings: Never    Marital Status: Divorced  Catering Manager Violence: Not At Risk (02/15/2024)   Humiliation, Afraid, Rape, and Kick questionnaire    Fear of Current or Ex-Partner: No    Emotionally Abused: No    Physically Abused: No    Sexually Abused: No    Review of Systems  Neurological:  Positive for dizziness and headaches.  All other systems reviewed and are negative.     Objective    BP 124/78 (Cuff Size: Large)   Pulse 88   Temp 98.6 F (37 C) (Oral)   Resp 16   Ht 5' 6 (1.676 m)   Wt 142 lb 9.6 oz (64.7 kg)   LMP 03/27/2020   SpO2 97%   BMI 23.02 kg/m   Physical Exam Constitutional:      Appearance: Normal appearance.  HENT:     Head: Normocephalic and atraumatic.  Eyes:     Conjunctiva/sclera: Conjunctivae normal.  Cardiovascular:     Rate and Rhythm: Normal rate and regular rhythm.  Pulmonary:     Effort: Pulmonary effort is normal.     Breath sounds: Normal breath sounds.  Skin:    General: Skin is warm and dry.  Neurological:     General: No focal deficit present.     Mental Status: She is alert. Mental status is at baseline.  Psychiatric:        Mood and Affect: Mood normal.        Behavior: Behavior normal.       Assessment & Plan:    Assessment & Plan Dizziness Persistent dizziness not fully controlled by current meclizine  dose. Possible migraine or neurological origin. Neurology has not addressed dizziness recently. - Increased meclizine  to 25 mg twice daily as needed. - Follow up with neurology for further evaluation.  Headaches Chronic headaches managed with Topamax , effective in prevention.  - Continue Topamax . - Follow up with neurology for further evaluation of dizziness.  Postmenopausal state Symptoms include hot flashes, mood changes, weight fluctuations, and muscle loss. Hormone replacement therapy contraindicated due to breast cancer history. Non-hormonal options discussed. - Ordered hormone level tests. - Consider referral to gynecologist or hormonal specialist if needed.  History of breast cancer Breast cancer limits treatment options, particularly hormone replacement therapy. Previous advice against hormone therapy due to increased cancer risk. - Ensure mammogram and MRI are completed as scheduled.  Subclinical hyperthyroidism Low TSH but normal thyroid  hormone levels. Previous negative tests for Graves' disease. - Continue monitoring thyroid  function tests.  - FSH/LH - Estrogens, total - Testosterone,Free and Total - meclizine  (ANTIVERT ) 25 MG tablet; Take 1 tablet (25 mg total) by mouth 2 (two) times daily as needed for dizziness.  Dispense: 90 tablet; Refill: 0  Return for already scheduled.   Sharyle Fischer, DO

## 2024-05-20 ENCOUNTER — Telehealth: Admitting: Internal Medicine

## 2024-05-20 ENCOUNTER — Ambulatory Visit: Payer: Self-pay | Admitting: Internal Medicine

## 2024-05-20 ENCOUNTER — Other Ambulatory Visit: Payer: Self-pay

## 2024-05-20 ENCOUNTER — Encounter: Payer: Self-pay | Admitting: Internal Medicine

## 2024-05-20 DIAGNOSIS — Z0289 Encounter for other administrative examinations: Secondary | ICD-10-CM

## 2024-05-20 NOTE — Progress Notes (Signed)
 Virtual Visit via Video Note  I connected with Suzzette Clark-Weston on 05/20/24 at 10:00 AM EST by a video enabled telemedicine application and verified that I am speaking with the correct person using two identifiers.  Location: Patient: Car (parked) Provider: [facility]   I discussed the limitations of evaluation and management by telemedicine and the availability of in person appointments. The patient expressed understanding and agreed to proceed.  History of Present Illness:  Discussed the use of AI scribe software for clinical note transcription with the patient, who gave verbal consent to proceed.  History of Present Illness Norissa Bartee is a 53 year old female who presents for assistance with housing accommodations due to her disability.  She is on HUD Section 8 due to her disability and currently resides in a one-bedroom unit. She seeks a two-bedroom unit to accommodate family members who assist her when she is unwell. Although the rent for the two-bedroom unit is within her voucher amount, her current voucher specifies a one-bedroom, causing logistical issues.  She has been in contact with her HUD worker since October to resolve this issue. Past experiences include paperwork being lost by the HUD worker, leading to delays in housing arrangements.  She experiences chronic insomnia, with difficulty sleeping until sunrise, resulting in daytime sleepiness and functional impairment.   Observations/Objective:  General: well appearing, no acute distress ENT: conjunctiva normal appearing bilaterally  Skin: no rashes, cyanosis or abnormal bruising noted Neuro: answers all questions appropriately   Assessment and Plan:  Encounter for Form Completion:  -Forms completed and signed to change housing voucher to 2 bedrooms to accommodate her daughter living with her. Forms have to be signed by the patient and will be picked up.   Follow Up Instructions: already scheduled    I  discussed the assessment and treatment plan with the patient. The patient was provided an opportunity to ask questions and all were answered. The patient agreed with the plan and demonstrated an understanding of the instructions.   The patient was advised to call back or seek an in-person evaluation if the symptoms worsen or if the condition fails to improve as anticipated.  I provided 8 minutes of non-face-to-face time during this encounter.   Sharyle Fischer, DO

## 2024-05-23 LAB — ESTROGENS, TOTAL: Estrogen: 37 pg/mL

## 2024-05-23 LAB — FSH/LH
FSH: 83.6 m[IU]/mL
LH: 44 m[IU]/mL

## 2024-05-23 LAB — TESTOSTERONE, FREE & TOTAL
Free Testosterone: 1 pg/mL (ref 0.1–6.4)
Testosterone, Total, LC-MS-MS: 20 ng/dL (ref 2–45)

## 2024-06-07 ENCOUNTER — Inpatient Hospital Stay: Attending: Oncology

## 2024-06-07 DIAGNOSIS — E538 Deficiency of other specified B group vitamins: Secondary | ICD-10-CM | POA: Diagnosis present

## 2024-06-07 DIAGNOSIS — C50812 Malignant neoplasm of overlapping sites of left female breast: Secondary | ICD-10-CM

## 2024-06-07 MED ORDER — CYANOCOBALAMIN 1000 MCG/ML IJ SOLN
1000.0000 ug | Freq: Once | INTRAMUSCULAR | Status: AC
  Administered 2024-06-07: 1000 ug via INTRAMUSCULAR
  Filled 2024-06-07: qty 1

## 2024-06-23 ENCOUNTER — Other Ambulatory Visit: Payer: Self-pay | Admitting: Internal Medicine

## 2024-06-26 NOTE — Telephone Encounter (Signed)
 Requested Prescriptions  Pending Prescriptions Disp Refills   metoprolol  tartrate (LOPRESSOR ) 25 MG tablet [Pharmacy Med Name: METOPROLOL  TARTRATE 25 MG TAB] 180 tablet 0    Sig: TAKE 1 TABLET BY MOUTH TWICE A DAY     Cardiovascular:  Beta Blockers Passed - 06/26/2024 10:31 AM      Passed - Last BP in normal range    BP Readings from Last 1 Encounters:  05/16/24 124/78         Passed - Last Heart Rate in normal range    Pulse Readings from Last 1 Encounters:  05/16/24 88         Passed - Valid encounter within last 6 months    Recent Outpatient Visits           1 month ago Encounter for completion of form with patient   [facility] Bernardo Fend, DO   1 month ago Dizziness   [facility] Bernardo Fend, DO   4 months ago Moderate episode of recurrent major depressive disorder [facility])   [facility] Bernardo Fend, DO   5 months ago Dizziness   [facility] Bernardo Fend, DO   7 months ago Diarrhea, unspecified type   [facility] Bernardo Fend, DO       Future Appointments             In 1 month Bernardo Fend, DO [facility] [place]             amLODipine  (NORVASC ) 5 MG tablet [Pharmacy Med Name: AMLODIPINE  BESYLATE 5 MG TAB] 90 tablet 0    Sig: TAKE 1 TABLET (5 MG TOTAL) BY MOUTH DAILY.     Cardiovascular: Calcium  Channel Blockers 2 Passed - 06/26/2024 10:31 AM      Passed - Last BP in normal range    BP Readings from Last 1 Encounters:  05/16/24 124/78         Passed - Last Heart Rate in normal range    Pulse Readings from Last 1 Encounters:  05/16/24 88         Passed - Valid encounter within last 6 months    Recent Outpatient Visits           1 month ago Encounter for completion of form with patient   [facility] Bernardo Fend, DO    1 month ago Dizziness   [facility] Bernardo Fend, DO   4 months ago Moderate episode of recurrent major depressive disorder [facility])   [facility] Bernardo Fend, DO   5 months ago Dizziness   [facility] Bernardo Fend, DO   7 months ago Diarrhea, unspecified type   [facility] Bernardo Fend, DO       Future Appointments             In 1 month Bernardo Fend, DO [facility], [employer]

## 2024-07-09 ENCOUNTER — Inpatient Hospital Stay: Attending: Oncology

## 2024-07-09 DIAGNOSIS — E538 Deficiency of other specified B group vitamins: Secondary | ICD-10-CM

## 2024-07-09 DIAGNOSIS — Z171 Estrogen receptor negative status [ER-]: Secondary | ICD-10-CM

## 2024-07-09 LAB — CMP (CANCER CENTER ONLY)
ALT: 28 U/L (ref 0–44)
AST: 52 U/L — ABNORMAL HIGH (ref 15–41)
Albumin: 4.4 g/dL (ref 3.5–5.0)
Alkaline Phosphatase: 46 U/L (ref 38–126)
Anion gap: 11 (ref 5–15)
BUN: 10 mg/dL (ref 6–20)
CO2: 27 mmol/L (ref 22–32)
Calcium: 10.2 mg/dL (ref 8.9–10.3)
Chloride: 102 mmol/L (ref 98–111)
Creatinine: 0.95 mg/dL (ref 0.44–1.00)
GFR, Estimated: >60 mL/min
Glucose, Bld: 115 mg/dL — ABNORMAL HIGH (ref 70–99)
Potassium: 5.1 mmol/L (ref 3.5–5.1)
Sodium: 140 mmol/L (ref 135–145)
Total Bilirubin: 0.4 mg/dL (ref 0.0–1.2)
Total Protein: 7.6 g/dL (ref 6.5–8.1)

## 2024-07-09 LAB — CBC WITH DIFFERENTIAL (CANCER CENTER ONLY)
Abs Immature Granulocytes: 0.02 K/uL (ref 0.00–0.07)
Basophils Absolute: 0 K/uL (ref 0.0–0.1)
Basophils Relative: 1 %
Eosinophils Absolute: 0.1 K/uL (ref 0.0–0.5)
Eosinophils Relative: 2 %
HCT: 40.6 % (ref 36.0–46.0)
Hemoglobin: 13.9 g/dL (ref 12.0–15.0)
Immature Granulocytes: 0 %
Lymphocytes Relative: 28 %
Lymphs Abs: 1.6 K/uL (ref 0.7–4.0)
MCH: 37.3 pg — ABNORMAL HIGH (ref 26.0–34.0)
MCHC: 34.2 g/dL (ref 30.0–36.0)
MCV: 108.8 fL — ABNORMAL HIGH (ref 80.0–100.0)
Monocytes Absolute: 0.6 K/uL (ref 0.1–1.0)
Monocytes Relative: 10 %
Neutro Abs: 3.6 K/uL (ref 1.7–7.7)
Neutrophils Relative %: 59 %
Platelet Count: 190 K/uL (ref 150–400)
RBC: 3.73 MIL/uL — ABNORMAL LOW (ref 3.87–5.11)
RDW: 12.4 % (ref 11.5–15.5)
WBC Count: 5.9 K/uL (ref 4.0–10.5)
nRBC: 0 % (ref 0.0–0.2)

## 2024-07-09 LAB — VITAMIN B12: Vitamin B-12: 650 pg/mL (ref 180–914)

## 2024-07-12 ENCOUNTER — Encounter: Payer: Self-pay | Admitting: Oncology

## 2024-07-12 ENCOUNTER — Inpatient Hospital Stay

## 2024-07-12 ENCOUNTER — Inpatient Hospital Stay: Admitting: Oncology

## 2024-07-12 VITALS — BP 120/87 | HR 64 | Temp 98.6°F | Resp 18 | Wt 140.8 lb

## 2024-07-12 DIAGNOSIS — E538 Deficiency of other specified B group vitamins: Secondary | ICD-10-CM

## 2024-07-12 DIAGNOSIS — F1090 Alcohol use, unspecified, uncomplicated: Secondary | ICD-10-CM | POA: Diagnosis not present

## 2024-07-12 DIAGNOSIS — C50812 Malignant neoplasm of overlapping sites of left female breast: Secondary | ICD-10-CM | POA: Diagnosis not present

## 2024-07-12 DIAGNOSIS — Z171 Estrogen receptor negative status [ER-]: Secondary | ICD-10-CM

## 2024-07-12 MED ORDER — CYANOCOBALAMIN 1000 MCG/ML IJ SOLN
1000.0000 ug | Freq: Once | INTRAMUSCULAR | Status: AC
  Administered 2024-07-12: 1000 ug via INTRAMUSCULAR
  Filled 2024-07-12: qty 1

## 2024-07-13 ENCOUNTER — Encounter: Payer: Self-pay | Admitting: Oncology

## 2024-07-13 NOTE — Progress Notes (Signed)
 " Hematology/Oncology Progress note [phone] [phone]      Patient Care Team: Bernardo Fend, DO as PCP - General (Internal Medicine) Patient, No Pcp Per (General Practice) Babara Call, MD as Consulting Physician (Hematology and Oncology) [facility], [facility]  ASSESSMENT & PLAN:   Cancer Staging  Malignant neoplasm of overlapping sites of left breast in female, estrogen receptor negative (HCC) Staging form: Breast, AJCC 8th Edition - Clinical stage from 08/25/2017: Stage IIB (cT2(2), cN0, cM0, G3, ER-, PR-, HER2-) - Signed by Babara Call, MD on 08/25/2017  Malignant neoplasm of overlapping sites of left breast in female, estrogen receptor negative (HCC) # 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  Labs reviewed and discussed patient.  Cancer markers are within normal limits. Continue annual mammogram surveillance. -over due in November 2024- missed appt. reschedule Recommend patient to continue calcium  and vitamin D  supplementation.   Patient desires breast reduction, patient was previously referred to plastic surgery.   Continue annual bilateral screening mammogram and annual MRI breast.   Alcohol use Recommend alcohol cessation, encourage her effort  B12 deficiency B12 level is stable.  Recommend B12 injections  every 2 months   Orders Placed This Encounter  Procedures   MR BREAST BILATERAL W WO CONTRAST INC CAD    Standing Status:   Future    Expected Date:   01/09/2025    Expiration Date:   09/09/2025    If indicated for the ordered procedure, I authorize the administration of contrast media per Radiology protocol:   Yes    What is the patient's sedation requirement?:   No Sedation    Does the patient have a pacemaker or implanted devices?:   No    Radiology Contrast Protocol - do NOT remove file path:    [email or web address]    Preferred imaging location?:   [facility] (table limit - 500lbs)   MM 3D SCREENING MAMMOGRAM BILATERAL BREAST    Standing Status:   Future    Expected Date:   07/16/2024    Expiration Date:   07/12/2025    Reason for Exam (SYMPTOM  OR DIAGNOSIS REQUIRED):   hx breast cancer    Preferred imaging location?:   [facility]    Is the patient pregnant?:   No   CMP (Cancer Center only)    Standing Status:   Future    Expected Date:   01/09/2025    Expiration Date:   04/09/2025   CBC with Differential (Cancer Center Only)    Standing Status:   Future    Expected Date:   01/09/2025    Expiration Date:   04/09/2025   Vitamin B12    Standing Status:   Future    Expected Date:   01/09/2025    Expiration Date:   04/09/2025   Folate    Standing Status:   Future    Expected Date:   01/09/2025    Expiration Date:   04/09/2025   Follow up in 6 months.   All questions were answered. The patient knows to call the clinic with any problems, questions or concerns.  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 two separate foci of invasive mammary carcinoma with associated tumor necrosis, DCIS present, fibroadenoma 12mm, Foci 1 is 40mm, and foci 2 is 35mm, margins are negative. Sentinel LN 0/0 involved. Negative LVI declined neoadjuvant or adjuvant chemotherapy  # Genetic testing: BRCA neg Testing did not reveal a pathogenic mutation in any of the genes analyzed.  #Iron  deficiency anemia status post multiple IV iron  infusions. # Uterus Fibroid disease;  underwent endometrial biopsy establish care with GYN Dr. Leonce and had D&C, polypectomy, myomectomy   #  Thyroid  nodule, previously ordered thyroid  ultrasound not done.    INTERVAL HISTORY Cindy Brewer is a 54 y.o. female who has above history reviewed by me today presents for follow up visit for Triple negative breast cancer  Patient reports feeling well.   She did not get  MRI breast bilaterally done.  she continues to drink alcohol and has cut back. No new complaints.  .  Review of Systems  Constitutional:  Negative for appetite change, chills, fatigue and fever.  HENT:   Negative for hearing loss and voice change.   Eyes:  Negative for eye problems.  Respiratory:  Negative for chest tightness, cough and shortness of breath.   Cardiovascular:  Negative for chest pain.  Gastrointestinal:  Negative for abdominal distention, abdominal pain and blood in stool.  Endocrine: Negative for hot flashes.  Genitourinary:  Negative for difficulty urinating and frequency.   Musculoskeletal:  Negative for arthralgias.  Skin:  Negative for itching and rash.  Neurological:  Negative for extremity weakness.  Hematological:  Negative for adenopathy.  Psychiatric/Behavioral:  Negative for confusion.      MEDICAL HISTORY:  Past Medical History:  Diagnosis Date   Anemia    Anxiety    Arthritis    right knee    Cancer (HCC) 2019   BREAST- Left   Depression    Fibroid    GERD (gastroesophageal reflux disease)    Headache     SURGICAL HISTORY: Past Surgical History:  Procedure Laterality Date   BREAST BIOPSY Left 2019   [facility]   BREAST LUMPECTOMY WITH AXILLARY LYMPH NODE BIOPSY Left 11/24/2017   Procedure: LEFT BREAST LUMPECTOMY WITH LEFT SENTINEL NODE LYMPH NODE BIOPSY;  Surgeon: Curvin Deward MOULD, MD;  Location: [facility] ORS;  Service: General;  Laterality: Left;   CESAREAN SECTION     EYE SURGERY     DETACHED RETINA 4/519   HYSTEROSCOPY WITH D & C N/A 03/14/2019   Procedure: DILATATION AND CURETTAGE LELDON MERL;  Surgeon: Leonce Garnette BIRCH, MD;  Location: [facility] ORS;   Service: Gynecology;  Laterality: N/A;   IR EMBO TUMOR ORGAN ISCHEMIA INFARCT INC GUIDE ROADMAPPING      SOCIAL HISTORY: Social History   Socioeconomic History   Marital status: Divorced    Spouse name: Not on file   Number of children: 5   Years of education: Not on file   Highest education level: Some college, no degree  Occupational History   Not on file  Tobacco Use   Smoking status: Former    Current packs/day: 0.00    Average packs/day: 0.3 packs/day for 13.0 years (3.3 ttl pk-yrs)    Types: Cigars, Cigarettes    Start date: 05/04/2009    Quit date: 05/04/2022    Years since quitting: 2.1   Smokeless tobacco: Never   Tobacco comments:    Pt states 1 black and mild daily   Vaping Use   Vaping status: Never Used  Substance and Sexual  Activity   Alcohol use: Yes    Alcohol/week: 9.0 standard drinks of alcohol    Types: 9 Shots of liquor per week    Comment: usually 3 shots on nights not working   Drug use: No   Sexual activity: Not Currently    Birth control/protection: None  Other Topics Concern   Not on file  Social History Narrative   ** Merged History Encounter **       Social Drivers of Health   Tobacco Use: Medium Risk (07/12/2024)   Patient History    Smoking Tobacco Use: Former    Smokeless Tobacco Use: Never    Passive Exposure: Not on Actuary Strain: Low Risk (02/15/2024)   Overall Financial Resource Strain (CARDIA)    Difficulty of Paying Living Expenses: Not hard at all  Food Insecurity: No Food Insecurity (02/15/2024)   Epic    Worried About Programme Researcher, Broadcasting/film/video in the Last Year: Never true    Ran Out of Food in the Last Year: Never true  Transportation Needs: No Transportation Needs (02/15/2024)   Epic    Lack of Transportation (Medical): No    Lack of Transportation (Non-Medical): No  Physical Activity: Sufficiently Active (02/15/2024)   Exercise Vital Sign    Days of Exercise per Week: 4 days    Minutes of Exercise per Session:  90 min  Stress: No Stress Concern Present (02/15/2024)   [employer] of Occupational Health - Occupational Stress Questionnaire    Feeling of Stress: Only a little  Social Connections: Socially Isolated (02/15/2024)   Social Connection and Isolation Panel    Frequency of Communication with Friends and Family: More than three times a week    Frequency of Social Gatherings with Friends and Family: Not on file    Attends Religious Services: Never    Active Member of Clubs or Organizations: No    Attends Banker Meetings: Never    Marital Status: Divorced  Catering Manager Violence: Not At Risk (02/15/2024)   Epic    Fear of Current or Ex-Partner: No    Emotionally Abused: No    Physically Abused: No    Sexually Abused: No  Depression (PHQ2-9): Medium Risk (05/16/2024)   Depression (PHQ2-9)    PHQ-2 Score: 5  Alcohol Screen: Low Risk (02/15/2024)   Alcohol Screen    Last Alcohol Screening Score (AUDIT): 3  Housing: Unknown (02/15/2024)   Epic    Unable to Pay for Housing in the Last Year: No    Number of Times Moved in the Last Year: Not on file    Homeless in the Last Year: No  Utilities: Not At Risk (02/15/2024)   Epic    Threatened with loss of utilities: No  Health Literacy: Adequate Health Literacy (02/15/2024)   B1300 Health Literacy    Frequency of need for help with medical instructions: Never    FAMILY HISTORY: Family History  Problem Relation Age of Onset   Kidney disease Mother    Heart disease Mother    Stroke Mother    Hepatitis C Mother    Dementia Father    Blindness Father    Kidney disease Sister    Schizophrenia Brother    Hypertension Brother    Diabetes Brother    Schizophrenia Paternal Aunt    Suicidality Paternal Aunt    Stroke Maternal Grandmother    Breast cancer Neg Hx     ALLERGIES:  is allergic to demerol [meperidine],  oxycodone, sulfa antibiotics, and percocet [oxycodone-acetaminophen ].  MEDICATIONS:  Current Outpatient  Medications  Medication Sig Dispense Refill   Acetaminophen  (TYLENOL  PO) Take by mouth as needed.     amLODipine  (NORVASC ) 5 MG tablet TAKE 1 TABLET (5 MG TOTAL) BY MOUTH DAILY. 90 tablet 0   colchicine 0.6 MG tablet Take 2 tablets (1.2 mg) at onset of gout flare. Take 1 additional tablet (0.6 mg) one hour later if symptoms persist. Maximum dose is 1.8 mg (3 tablets) in 24 hours. Then take one tablet (0.6 mg) daily until symptoms resolve.     cyanocobalamin  (VITAMIN B12) 1000 MCG tablet Take 1 tablet (1,000 mcg total) by mouth daily. (Patient taking differently: Take 1,000 mcg by mouth daily. TAKES VIT B12 SHOTS EVERY 3 WEEKS) 90 tablet 1   escitalopram  (LEXAPRO ) 20 MG tablet Take 1 tablet (20 mg total) by mouth daily. 90 tablet 1   hydrochlorothiazide  (HYDRODIURIL ) 25 MG tablet TAKE 1 TABLET (25 MG TOTAL) BY MOUTH DAILY. 90 tablet 0   meclizine  (ANTIVERT ) 25 MG tablet Take 1 tablet (25 mg total) by mouth 2 (two) times daily as needed for dizziness. 90 tablet 0   metoprolol  tartrate (LOPRESSOR ) 25 MG tablet TAKE 1 TABLET BY MOUTH TWICE A DAY 180 tablet 0   Multiple Vitamin (MULTIVITAMIN WITH MINERALS) TABS tablet Take 1 tablet by mouth daily.     omeprazole  (PRILOSEC) 20 MG capsule Take 1 capsule (20 mg total) by mouth daily as needed (acid reflux).     topiramate  (TOPAMAX ) 25 MG tablet Take 1 tablet (25 mg total) by mouth 2 (two) times daily. 60 tablet 11   Vitamin D , Ergocalciferol , (DRISDOL ) 1.25 MG (50000 UNIT) CAPS capsule Take 1 capsule (50,000 Units total) by mouth every 7 (seven) days. (Patient not taking: Reported on 07/12/2024) 12 capsule 0   No current facility-administered medications for this visit.     PHYSICAL EXAMINATION: ECOG PERFORMANCE STATUS: 0 - Asymptomatic Vitals:   07/12/24 1122  BP: 120/87  Pulse: 64  Resp: 18  Temp: 98.6 F (37 C)  SpO2: 100%   Filed Weights   07/12/24 1122  Weight: 140 lb 12.8 oz (63.9 kg)     Physical Exam Constitutional:      General:  She is not in acute distress.    Appearance: She is not diaphoretic.  HENT:     Head: Normocephalic.  Eyes:     General: No scleral icterus. Neck:     Vascular: No JVD.  Cardiovascular:     Rate and Rhythm: Normal rate.  Pulmonary:     Effort: Pulmonary effort is normal. No respiratory distress.     Breath sounds: No wheezing.  Abdominal:     General: There is no distension.  Musculoskeletal:        General: Normal range of motion.     Cervical back: Normal range of motion.  Lymphadenopathy:     Cervical: No cervical adenopathy.  Skin:    Findings: No rash.  Neurological:     Mental Status: She is alert and oriented to person, place, and time. Mental status is at baseline.     Motor: No abnormal muscle tone.  Psychiatric:        Mood and Affect: Mood and affect normal.        Cognition and Memory: Memory normal.         LABORATORY DATA:  I have reviewed the data as listed     Latest Ref Rng & Units 07/09/2024  11:17 AM 02/09/2024    3:15 PM 12/12/2023   11:30 AM  CBC  WBC 4.0 - 10.5 K/uL 5.9  4.3  5.3   Hemoglobin 12.0 - 15.0 g/dL 86.0  86.6  85.9   Hematocrit 36.0 - 46.0 % 40.6  39.5  40.9   Platelets 150 - 400 K/uL 190  244  203       Latest Ref Rng & Units 07/09/2024   11:17 AM 02/09/2024    3:15 PM 12/12/2023   11:30 AM  CMP  Glucose 70 - 99 mg/dL 884  891  894   BUN 6 - 20 mg/dL 10  14  14    Creatinine 0.44 - 1.00 mg/dL 9.04  9.13  9.27   Sodium 135 - 145 mmol/L 140  141  137   Potassium 3.5 - 5.1 mmol/L 5.1  5.1  4.2   Chloride 98 - 111 mmol/L 102  102  105   CO2 22 - 32 mmol/L 27  30  23    Calcium  8.9 - 10.3 mg/dL 89.7  89.4  8.9   Total Protein 6.5 - 8.1 g/dL 7.6  7.6  8.0   Total Bilirubin 0.0 - 1.2 mg/dL 0.4  0.5  0.7   Alkaline Phos 38 - 126 U/L 46   43   AST 15 - 41 U/L 52  41  36   ALT 0 - 44 U/L 28  25  21       "

## 2024-07-13 NOTE — Assessment & Plan Note (Signed)
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  Labs reviewed and discussed patient.  Cancer markers are within normal limits. Continue annual mammogram surveillance. -over due in November 2024- missed appt. reschedule Recommend patient to continue calcium  and vitamin D  supplementation.   Patient desires breast reduction, patient was previously referred to plastic surgery.   Continue annual bilateral screening mammogram and annual MRI breast.

## 2024-07-13 NOTE — Assessment & Plan Note (Signed)
 Recommend alcohol cessation, encourage her effort

## 2024-07-13 NOTE — Assessment & Plan Note (Signed)
 B12 level is stable.  Recommend B12 injections  every 2 months

## 2024-08-09 ENCOUNTER — Ambulatory Visit: Admitting: Internal Medicine

## 2024-08-12 ENCOUNTER — Encounter

## 2024-08-13 ENCOUNTER — Ambulatory Visit: Admitting: Internal Medicine

## 2024-09-06 ENCOUNTER — Inpatient Hospital Stay

## 2024-10-01 ENCOUNTER — Ambulatory Visit: Admitting: Adult Health

## 2024-11-01 ENCOUNTER — Inpatient Hospital Stay

## 2025-01-17 ENCOUNTER — Inpatient Hospital Stay

## 2025-01-24 ENCOUNTER — Inpatient Hospital Stay: Admitting: Oncology

## 2025-01-24 ENCOUNTER — Inpatient Hospital Stay

## 2025-02-20 ENCOUNTER — Ambulatory Visit
